# Patient Record
Sex: Female | Born: 1960
Health system: Southern US, Community
[De-identification: ages and names within clinical notes are randomized; demographics above are authoritative.]

## PROBLEM LIST (undated history)

## (undated) DIAGNOSIS — E785 Hyperlipidemia, unspecified: Secondary | ICD-10-CM

## (undated) DIAGNOSIS — I10 Essential (primary) hypertension: Secondary | ICD-10-CM

## (undated) DIAGNOSIS — G510 Bell's palsy: Secondary | ICD-10-CM

## (undated) DIAGNOSIS — E119 Type 2 diabetes mellitus without complications: Secondary | ICD-10-CM

## (undated) HISTORY — DX: Hyperlipidemia, unspecified: E78.5

## (undated) HISTORY — PX: CHOLECYSTECTOMY: SHX55

## (undated) HISTORY — DX: Bell's palsy: G51.0

---

## 2014-04-12 ENCOUNTER — Emergency Department (HOSPITAL_COMMUNITY)
Admission: EM | Admit: 2014-04-12 | Discharge: 2014-04-13 | Disposition: A | Discharge status: Home / Self Care | Payer: No Typology Code available for payment source | Attending: Emergency Medicine | Admitting: Emergency Medicine

## 2014-04-12 ENCOUNTER — Encounter (HOSPITAL_COMMUNITY): Payer: Self-pay | Admitting: Emergency Medicine

## 2014-04-12 ENCOUNTER — Emergency Department (HOSPITAL_COMMUNITY): Payer: No Typology Code available for payment source

## 2014-04-12 DIAGNOSIS — N644 Mastodynia: Secondary | ICD-10-CM | POA: Insufficient documentation

## 2014-04-12 DIAGNOSIS — M79609 Pain in unspecified limb: Secondary | ICD-10-CM | POA: Insufficient documentation

## 2014-04-12 DIAGNOSIS — M79602 Pain in left arm: Secondary | ICD-10-CM

## 2014-04-12 LAB — BASIC METABOLIC PANEL
Anion gap: 12 (ref 5–15)
BUN: 9 mg/dL (ref 6–23)
CO2: 25 mEq/L (ref 19–32)
Calcium: 9.4 mg/dL (ref 8.4–10.5)
Chloride: 100 mEq/L (ref 96–112)
Creatinine, Ser: 0.62 mg/dL (ref 0.50–1.10)
GFR calc Af Amer: >90 mL/min (ref >90)
Glucose, Bld: 110 mg/dL — ABNORMAL HIGH (ref 70–99)
POTASSIUM: 3.5 meq/L — AB (ref 3.7–5.3)
Sodium: 137 mEq/L (ref 137–147)

## 2014-04-12 LAB — D-DIMER, QUANTITATIVE (NOT AT ARMC): D-Dimer, Quant: 0.5 ug/mL-FEU — ABNORMAL HIGH (ref 0.00–0.48)

## 2014-04-12 LAB — CBC
HCT: 39.4 % (ref 36.0–46.0)
Hemoglobin: 13.4 g/dL (ref 12.0–15.0)
MCH: 30.2 pg (ref 26.0–34.0)
MCHC: 34 g/dL (ref 30.0–36.0)
MCV: 88.9 fL (ref 78.0–100.0)
Platelets: 186 10*3/uL (ref 150–400)
RBC: 4.43 MIL/uL (ref 3.87–5.11)
RDW: 13.1 % (ref 11.5–15.5)
WBC: 12.2 10*3/uL — ABNORMAL HIGH (ref 4.0–10.5)

## 2014-04-12 MED ORDER — HYDROCODONE-ACETAMINOPHEN 5-325 MG PO TABS: 1.0000 | ORAL_TABLET | ORAL | Status: DC | PRN

## 2014-04-12 MED ORDER — OXYCODONE-ACETAMINOPHEN 5-325 MG PO TABS
1.0000 | ORAL_TABLET | Freq: Once | ORAL | Status: AC
Start: 2014-04-12 — End: 2014-04-12
  Administered 2014-04-12: 1 via ORAL
  Filled 2014-04-12: qty 1

## 2014-04-12 NOTE — ED Provider Notes (Signed)
CSN: [id number]     Arrival date & time 04/12/14  1741 History   First MD Initiated Contact with Patient 04/12/14 1949     Chief Complaint  Patient presents with  . Breast Pain  . Arm Pain     (Consider location/radiation/quality/duration/timing/severity/associated sxs/prior Treatment) HPI Comments: 53 year old Hispanic female presents to the emergency department with her close friend was complaining of left arm and left breast pain x1 month, worsening over the past 2 days. No known injury or trauma. Pain is constant, throbbing, worse with left arm movement or when clothing touches the area. She has not tried any alleviating factors. She reports she felt feverish but is unsure if she had a temperature. Denies chest pain or shortness of breath. Denies history of breast cancer. No nipple discharge. No swelling or color change. Denies history of blood clots. Denies any activity out of her normal.  Patient is a 53 y.o. female presenting with arm pain. The history is provided by the patient and a friend.  Arm Pain    History reviewed. No pertinent past medical history. History reviewed. No pertinent past surgical history. No family history on file. History  Substance Use Topics  . Smoking status: Never Smoker   . Smokeless tobacco: Not on file  . Alcohol Use: No   OB History   Grav Para Term Preterm Abortions TAB SAB Ect Mult Living                 Review of Systems  Genitourinary:       + left breast pain.  Musculoskeletal:       + left arm pain.  All other systems reviewed and are negative.     Allergies  Review of patient's allergies indicates no known allergies.  Home Medications   Prior to Admission medications   Medication Sig Start Date End Date Taking? Authorizing Provider  HYDROcodone-acetaminophen (NORCO/VICODIN) 5-325 MG per tablet Take 1-2 tablets by mouth every 4 (four) hours as needed. 04/12/14   Trevor Mace, PA-C  naproxen (NAPROSYN) 250 MG tablet Take  500 mg by mouth 2 (two) times daily as needed (pain).   Yes Historical Provider, MD   BP 99/49  Pulse 73  Temp(Src) 97.7 F (36.5 C) (Oral)  Resp 14  SpO2 97% Physical Exam  Nursing note and vitals reviewed. Constitutional: She is oriented to person, place, and time. She appears well-developed and well-nourished. No distress.  HENT:  Head: Normocephalic and atraumatic.  Mouth/Throat: Oropharynx is clear and moist.  Eyes: Conjunctivae and EOM are normal.  Neck: Normal range of motion. Neck supple.  Cardiovascular: Normal rate, regular rhythm, normal heart sounds and intact distal pulses.   Pulmonary/Chest: Effort normal and breath sounds normal. No respiratory distress.  Musculoskeletal: Normal range of motion. She exhibits no edema.  Tender to palpation over entire left breast and pectoralis muscle without overlying erythema or abscess. No discharge. No masses palpated. Tender to palpation left upper arm. No size discrepancy of bilateral upper extremities. No erythema. No focal tenderness. Full range of motion of left arm. Pain noted with movement.  Lymphadenopathy:    She has no axillary adenopathy.       Right: No supraclavicular adenopathy present.       Left: No supraclavicular adenopathy present.  Neurological: She is alert and oriented to person, place, and time. No sensory deficit.  Skin: Skin is warm and dry.  Psychiatric: She has a normal mood and affect. Her behavior is normal.  ED Course  Procedures (including critical care time) Labs Review Labs Reviewed  CBC - Abnormal; Notable for the following:    WBC 12.2 (*)    All other components within normal limits  BASIC METABOLIC PANEL - Abnormal; Notable for the following:    Potassium 3.5 (*)    Glucose, Bld 110 (*)    All other components within normal limits  D-DIMER, QUANTITATIVE - Abnormal; Notable for the following:    D-Dimer, Quant 0.50 (*)    All other components within normal limits    Imaging  Review Dg Chest 2 View  04/12/2014   CLINICAL DATA:  Left-sided chest pain for 2 days. Shortness of breath and wheezing.  EXAM: CHEST  2 VIEW  COMPARISON:  None.  FINDINGS: There is peribronchial thickening consistent with bronchitis. No consolidative infiltrates or effusions. Heart size is normal. Vascularity is normal. No osseous abnormality.  IMPRESSION: Bronchitic changes.   Electronically Signed   By: Geanie CooleyJim  Maxwell M.D.   On: 04/12/2014 23:01     EKG Interpretation   Date/Time:  Tuesday April 12 2014 22:43:43 EDT Ventricular Rate:  68 PR Interval:  143 QRS Duration: 96 QT Interval:  444 QTC Calculation: 472 R Axis:   -10 Text Interpretation:  Sinus rhythm No previous ECGs available Confirmed by  RANCOUR  MD, STEPHEN [phone]([id number]) on 04/12/2014 10:49:59 PM      MDM   Final diagnoses:  Breast pain, left  Left arm pain   Patient presenting with reproducible left-sided breast and arm pain. She is nontoxic appearing and in no apparent distress. Afebrile, vital signs stable. No associated chest pain or shortness of breath. No signs of infection present. No abscess or masses. No drainage. No extremity size discrepancy. No adenopathy. Low suspicion for a DVT, however given the significance of left arm tenderness, upper extremity venous duplex ordered. Patient will return to the emergency department tomorrow for the study. D-dimer 0.5. Very low suspicion for PE given no associated chest pain or shortness of breath. EKG, chest x-ray without any acute findings. Patient reports symptoms have started to improve with Percocet. Patient is stable for discharge, followup back to the hospital tomorrow for duplex ultrasound, also followup with the breast clinic. Return precautions given. Patient states understanding of treatment care plan and is agreeable.  Case discussed with attending Dr. Manus Gunningancour who also evaluated patient and agrees with plan of care.   Trevor MaceRobyn M Albert, PA-C 04/12/14 2355

## 2014-04-12 NOTE — ED Notes (Signed)
Pt c/o of left breat pain and left arm pain x1 month. States that she can barely move arm. Denies injury.

## 2014-04-12 NOTE — ED Notes (Signed)
Pt reports having left breast pain that radiates to the left arm.  No swelling noted on left breast but the breast is tender to palpation.  Pt has a +2 radial pulse on left arm.

## 2014-04-12 NOTE — Discharge Instructions (Signed)
Take Vicodin for severe pain only. No driving or operating heavy machinery while taking vicodin. This medication may cause drowsiness. Return to [facility] emergency department tomorrow to the radiology department for upper extremity venous duplex to rule out DVT.  Tome Vicodin slo para Occupational hygienist. No se puede conducir o manejar maquinaria pesada mientras tomo vicodin . Este medicamento puede causar somnolencia . Regresar a Artist maana para el departamento de radiologa de dplex venoso de las extremidades superiores para descartar TVP.  Dolor msculoesqueltico (Musculoskeletal Pain) El dolor musculoesqueltico se siente en huesos y msculos. El dolor puede ocurrir en cualquier parte del cuerpo. El profesional que lo asiste podr tratarlo sin Geologist, engineering causa del dolor. Lo tratar Time [employer] de laboratorio (sangre y [place]), las radiografas y otros estudios sean normales. La causa de estos dolores puede ser un virus.  CAUSAS Generalmente no existe una causa definida para este trastorno. Tambin el Citigroup puede deberse a la [place]. En la actividad excesiva se incluye el hacer ejercicios fsicos muy intensos cuando no se est en buena forma. El dolor de huesos tambin puede deberse a cambios climticos. Los huesos son sensibles a los cambios en la presin atmosfrica. INSTRUCCIONES PARA EL CUIDADO DOMICILIARIO  Para proteger su privacidad, no se entregarn los [employer] pruebas por telfono. Asegrese de conseguirlos. Consulte el modo en que podr obtenerlos si no se lo han informado. Es su responsabilidad contar con los [employer] Corporation.  Utilice los medicamentos de venta libre o de prescripcin para Chief Technology Officer, Environmental health practitioner o la [place], segn se lo indique el profesional que lo asiste. Si le han administrado medicamentos, no conduzca, no opere maquinarias ni Diplomatic Services operational officer, y tampoco firme documentos legales durante  24 horas. No beba alcohol. No tome pldoras para dormir ni otros medicamentos que Museum/gallery curator.  Podr seguir con todas las actividades a menos que stas le ocasionen ms Merck & Co. Cuando el dolor disminuya, es importante que gradualmente reanude toda la rutina habitual. Retome las actividades comenzando lentamente. Aumente gradualmente la intensidad y la duracin de sus actividades o del ejercicio.  Durante los perodos de dolor intenso, el reposo en cama puede ser beneficioso. Recustese o sintese en la posicin que le sea ms cmoda.  Coloque hielo sobre la zona afectada.  Ponga hielo en Lucile Shutters.  Colquese una toalla entre la piel y la bolsa de hielo.  Aplique el hielo durante 10 a 20 minutos 3  4 veces por da.  Si el dolor empeora, o no desaparece puede ser Northeast Utilities repetir las pruebas o Education officer, environmental nuevos exmenes. El profesional que lo asiste podr requerir investigar ms profundamente para Veterinary surgeon causa posible. SOLICITE ATENCIN MDICA DE INMEDIATO SI:  Siente que el dolor empeora y no se alivia con los medicamentos.  Siente dolor en el pecho asociado a falta de aire, sudoracin, nuseas o vmitos.  El dolor se localiza en el abdomen.  Comienza a sentir nuevos sntomas que parecen ser diferentes o que lo preocupan. ASEGRESE DE QUE:   Comprende las instrucciones para el alta mdica.  Controlar su enfermedad.  Solicitar atencin mdica de inmediato segn las indicaciones. Document Released: 06/12/2005 Document Revised: 11/25/2011 [facility] Patient Information 2015 [place], [place]. This information is not intended to replace advice given to you by your health care provider. Make sure you discuss any questions you have with your health care provider. Trombosis venosa profunda (Deep Vein Thrombosis) La trombosis venosa profunda (TVP) es  la formacin de un cogulo de sangre en las venas profundas y ms grandes de la pierna, el brazo o la pelvis.  Esta trombosis es ms peligrosa que los cogulos que se forman en las venas ubicadas cerca de la superficie del cuerpo. La TVP puede causar complicaciones graves e, incluso, potencialmente mortales si el cogulo se desprende y viaja a travs de la Runner, broadcasting/film/video a los pulmones.  La TVP puede daar las vlvulas de las venas de las piernas, de manera que el flujo sanguneo, Teacher, English as a foreign language de ir hacia arriba, se acumula en la pierna. A este trastorno se lo llama sndrome postrombtico, y Patent examiner, hinchazn, decoloracin y lceras en la pierna. CAUSAS Generalmente, varios son los motivos que contribuyen a la formacin de cogulos sanguneos. Algunos de los factores que favorecen la formacin de cogulos son:  El flujo sanguneo lento.  El interior de la vena daado por algn motivo.  Tiene algn padecimiento que favorece la formacin de cogulos de [place]. FACTORES DE RIESGO Algunas personas tienen ms facilidad que otras para formar cogulos sanguneos. Los factores de riesgo son:   Rosalva Ferron.  Tener sobrepeso (ser obeso).  Permanecer sentado o acostado durante un tiempo prolongado. Aqu se incluyen un viaje de larga distancia, casos de parlisis o en recuperacin de una enfermedad o ciruga. Otros factores que [place] riesgo son:   La edad [place], especialmente despus de los 75aos de edad.  Tener antecedentes familiares de cogulos de sangre o si ya ha tenido un cogulo de [place].  Que le hayan practicado una ciruga mayor o de duracin prolongada. Esto es an ms frecuente en cirugas de cadera, rodilla o estmago (abdomen). La ciruga de cadera es especialmente riesgosa.  Tener colocado un tubo fino y Equities trader (catter) dentro de una vena durante un procedimiento mdico.  Fractura de cadera o de pierna.  Cncer o tratamiento para el cncer.  Embarazo y [place].  Los cambios hormonales durante el embarazo facilitan la formacin de cogulos.  El feto hace presin sobre  las venas de la pelvis.  Hay un riesgo de que se lesionen las venas durante el parto o la cesrea. El riesgo es mayor inmediatamente despus del [place].  Los medicamentos que contienen estrgenos. Estos incluyen las pldoras anticonceptivas y la terapia de reemplazo hormonal.  Otras afecciones circulatorias o cardacas.  SIGNOS Y SNTOMAS Cuando se forma un cogulo, puede obstruir en forma parcial o total el flujo sanguneo en la vena afectada. Los sntomas de TVP pueden ser:  Hinchazn de la pierna o del brazo, especialmente notorio de un solo lado.  Enrojecimiento y calor de la pierna o del brazo, Haematologist de un solo lado.  Dolor en un brazo o en una pierna. Si el cogulo est ubicado en la pierna, los sntomas pueden ser ms notorios y Theme park manager al pararse o Advertising account planner. Los sntomas de TVP, cuando ha migrado a los pulmones, (embolia de [place], [place]) y Scientist, research (physical sciences), comienzan sbitamente y pueden ser:  Harrel Lemon de aire.  Tos.  Tos con sangre o mucosidad sanguinolenta.  Dolor en el pecho. Dolor que empeora al hacer inspiraciones profundas.  Latidos cardacos rpidos. Teresita [place] persona con estos sntomas debe buscar rpidamente tratamiento mdico de Associate Professor. No espere hasta que los sntomas desaparezcan. Comunquese con el servicio de emergencias de su localidad (911 en los [place]) si usted tiene estos sntomas. No conduzca por sus propios medios [employer]Max [employer]. DIAGNSTICO Si se sospecha una TVP, su mdico le har una historia clnica [place] y  un examen fsico. Podrn solicitarle otros estudios, por ejemplo:  Anlisis de [place], incluyendo estudios de [place].  Ecografa para detectar cogulos en las piernas o los pulmones.  Estudios radiolgicos con inyeccin de contraste intravenoso para ver el flujo sanguneo (venografa).  Estudios de los pulmones, si tiene sntomas respiratorios. PREVENCIN  Ejercite las piernas con regularidad. Camine media Best [employer].  Mantenga un peso apropiado para su altura.  Evite permanecer sentado o parado sin moverse durante perodos prolongados.  Las mujeres, especialmente las que tienen ms de 35aos, deben C.H. [employer] Worldwide y beneficios de tomar medicamentos que contengan estrgeno, incluidas las pldoras anticonceptivas.  No fume, especialmente si toma estrgenos.  Los viajes de larga distancia pueden aumentar el riesgo de TVP. Ejercite las piernas caminando o moviendo los msculos una vez por hora.  Muchos de los factores de riesgo anteriormente mencionados se refieren a situaciones que ocurren con la hospitalizacin, ya sea por enfermedad, lesin o ciruga programada. La prevencin puede ser con o sin medicamentos.  El mdico lo evaluar para Production assistant, radio necesidad de prevenir la tromboembolia venosa cuando ingrese al hospital. Si debe someterse a [place], su cirujano lo evaluar el mismo da o el da despus de la [place]. TRATAMIENTO Si se diagnostica una TVP, debe ser tratada. Tambin en algunas circunstancias, puede prevenirse. Si ya ha tenido una TVP, aumenta el riesgo de volver a Teacher, music futuro . El tratamiento ms frecuente para la TVP se realiza con anticoagulantes, medicamentos que reducen la tendencia de la sangre a coagularse. Los anticoagulantes pueden impedir la formacin de nuevos cogulos sanguneos o el aumento del tamao de los cogulos existentes. No pueden disolver los cogulos ya formados. El organismo lo hace por s mismo con Museum/gallery conservator. Los anticoagulantes pueden administrarse por va oral, intravenosa (IV) o inyectable. Su mdico determinar la mejor forma para usted. Estos son otros medicamentos o tratamiento que pueden utilizarse:  La heparina o los medicamentos relacionados (heparina de bajo peso molecular) suelen ser Financial risk analyst tratamiento para un cogulo sanguneo. Actan rpidamente. Sin embargo, no pueden tomarse por va oral y deben administrarse por va inyectable  o intravenosa.  La heparina disminuye la cantidad de un componente de la sangre cuya funcin es impedir las hemorragias y forma cogulos sanguneos (plaquetas). Ser controlado mediante anlisis de sangre para asegurarse de que esto no [place].  La warfarina es un anticoagulante que puede ingerirse. Demora unos Wells [employer] a Scientist, water quality, por lo tanto se [place] en combinacin con heparina o medicamentos relacionados. Una vez que la warfarina comienza a actuar, la heparina se suspende.  Los inhibidores del factorX activado, como rivaroxaban y apixaban, tambin reducen la coagulacin sangunea. Estos medicamentos se toman por va oral y a menudo pueden administrarse sin heparina ni medicamentos relacionados.  Con menor frecuencia, se utilizan medicamentos que disuelven cogulos (trombolticos) para Restaurant manager, fast food una TVP. Como implican un mayor riesgo de [place], se utilizan principalmente en los casos graves, cuando la vida o la integridad fsica se ven [place].  En contadas ocasiones, un cogulo de sangre en la pierna debe eliminarse quirrgicamente.  Si usted no puede Solicitor, su mdico podra indicar la colocacin de un filtro en la vena principal del abdomen. Los filtros impiden que los cogulos lleguen a los pulmones. INSTRUCCIONES PARA EL CUIDADO EN EL HOGAR  Tome todos los Monsanto Company se lo haya indicado el mdico.  Aprenda tanto como pueda sobre la TVP.  Utilice un brazalete o lleve consigo una tarjeta  de alerta mdico.  Consulte con su mdico cundo podr volver a sus actividades normales. Es importante mantenerse activo para prevenir los cogulos sanguneos. Si est tomando un medicamento anticoagulante, evite los deportes de contacto.  Es Barrister's clerk. Esto es especialmente importante durante viajes, o al estar sentado o de pie durante largos perodos. Camine o contraiga y relaje frecuentemente los msculos de las piernas para ejercitarlas.  Camine con frecuencia.  Es posible que deba usar medias de compresin. Son medias elsticas apretadas que aplican presin a las piernas. Esta presin ayuda a Programmer, systems en las piernas se coagule. Cmo tomar la warfarina La warfarina es un medicamento que se toma diariamente por va oral. Su mdico le indicar la duracin del tratamiento (generalmente de 3 a y a Occupational psychologist toda la vida). Si toma warfarina:  Aprenda cmo tomar warfarina y cules son los alimentos que pueden influir en el efecto que esta tiene en el organismo.  Las dosis altas y bajas de warfarina son peligrosas. El exceso de warfarina aumenta el riesgo de [place]. Dosis demasiado bajas de warfarina aumentan el riesgo de formacin de cogulos. La warfarina y los anlisis de sangre peridicos Durante el tratamiento con Arts administrator, es necesario que se realicen peridicamente anlisis de sangre que miden el tiempo de Biomedical scientist. Estos anlisis de sangre suelen incluir tanto el tiempo de protrombina (PT) como el ndice internacional normalizado (INR). Los resultados del PT y del INR permiten al mdico ajustar la dosis de warfarina. Es muy importante que le hagan anlisis del PT y el INR con la frecuencia que el mdico lo indique.  La warfarina y la dieta Evite cambios importantes en su dieta, o hable con su mdico antes de cambiarla. Programe una cita con un nutricionista matriculado para que responda sus preguntas. Muchos de los alimentos, especialmente los que contienen vitamina K pueden interferir con la warfarina y Audiological scientist los [place] de PT y del INR. Debe ingerir una cantidad constante de alimentos con alto contenido de vitaminaK, [employer] se incluyen:   espinaca, col rizada, brcoli, repollo, acelga y nabos verdes, repollitos de [place], guisantes, coliflor, algas y perejil.  Hgado de vaca y de cerdo.  T verde.  Aceite de soja. La warfarina y otros medicamentos Muchos medicamentos interfieren con la  warfarina y [employer]Ex del PT y del INR. Usted debe:  Informar al [employer]rah[employer] Entertainment, las vitaminas y los suplementos que toma, entre ellos, aspirina y otros antiinflamatorios de Sales promotion account executive. Tenga especial cuidado con la aspirina y los medicamentos antiinflamatorios. Consulte a su mdico antes de tomarlos.  No tome ni suspenda ningn medicamento tanto recetado como de [employer], excepto si se lo indica el mdico o Administrator, sports. Efectos secundarios de la warfarina La warfarina puede causar efectos secundarios, por ejemplo, hematomas y dificultad para detener el sangrado. Pregntele al mdico o al farmacutico sobre otros efectos secundarios de la warfarina. Usted deber:  Ejercer presin sobre las heridas cortantes durante ms tiempo que lo habitual.  Informar al dentista y a otros mdicos que est tomando warfarina, antes de someterse a cualquier procedimiento que pueda causar sangrado. La warfarina con alcohol y tabaco   Beber alcohol con frecuencia puede potenciar el efecto de la warfarina y causar sangrado excesivo. Es preferible no tomar bebidas alcohlicas o consumir solo cantidades muy pequeas cuando se est en tratamiento con warfarina. Hgale saber a su mdico si modifica su consumo de alcohol.  No consuma ningn producto que  contenga tabaco, como cigarrillos, tabaco de Theatre managermascar o Administrator, Civil Servicecigarrillos electrnicos. Si fuma, abandone el hbito. Pdale ayuda al mdico, si es necesario. Medicamentos alternativos de la warfarina: Inhibidores del factorX activado  Estos anticoagulantes se toman por va oral, generalmente durante varias semanas o ms tiempo. Es importante que tome el medicamento a la misma hora todos [place] das.  No es necesario realizar anlisis de sangre peridicos cuando se toman estos medicamentos.  Estos tienen menos interacciones con los alimentos y los medicamentos que la warfarina.  Los efectos secundarios de esta clase de medicamentos son  similares a los que se presentan con la warfarina, incluidos los hematomas y el sangrado excesivos. Pregnteles al mdico o al farmacutico sobre otros posibles efectos secundarios. SOLICITE ATENCIN MDICA SI:  Nota que el ritmo de sus latidos cardacos est acelerado.  Se siente ms dbil o ms cansado que de costumbre.  Siente que va a desmayarse.  Observa un aumento de los hematomas.  Siente que los sntomas no mejoran en el tiempo esperado.  Cree que tiene efectos secundarios por el medicamento. SOLICITE ATENCIN MDICA DE INMEDIATO SI:  Siente dolor en el pecho.  Tiene dificultad para respirar.  Aumenta la hinchazn o el dolor en una pierna.  Tose y escupe sangre.  Nota sangre en el vmito, en las heces o en la orina. ASEGRESE DE QUE:  Comprende estas instrucciones.  Controlar su afeccin.  Recibir ayuda de inmediato si no mejora o si empeora. Document Released: 12/10/2007 Document Revised: 01/17/2014 [facility] Patient Information 2015 [place], [place]. This information is not intended to replace advice given to you by your health care provider. Make sure you discuss any questions you have with your health care provider.

## 2014-04-13 ENCOUNTER — Encounter (HOSPITAL_COMMUNITY): Payer: Self-pay | Admitting: Emergency Medicine

## 2014-04-13 ENCOUNTER — Emergency Department (HOSPITAL_COMMUNITY)
Admission: EM | Admit: 2014-04-13 | Discharge: 2014-04-13 | Disposition: A | Discharge status: Home / Self Care | Payer: No Typology Code available for payment source | Attending: Emergency Medicine | Admitting: Emergency Medicine

## 2014-04-13 ENCOUNTER — Ambulatory Visit (HOSPITAL_COMMUNITY): Payer: No Typology Code available for payment source

## 2014-04-13 DIAGNOSIS — N390 Urinary tract infection, site not specified: Secondary | ICD-10-CM | POA: Insufficient documentation

## 2014-04-13 DIAGNOSIS — Z792 Long term (current) use of antibiotics: Secondary | ICD-10-CM | POA: Insufficient documentation

## 2014-04-13 DIAGNOSIS — R079 Chest pain, unspecified: Secondary | ICD-10-CM | POA: Insufficient documentation

## 2014-04-13 DIAGNOSIS — M79609 Pain in unspecified limb: Secondary | ICD-10-CM

## 2014-04-13 DIAGNOSIS — J3489 Other specified disorders of nose and nasal sinuses: Secondary | ICD-10-CM | POA: Insufficient documentation

## 2014-04-13 DIAGNOSIS — N644 Mastodynia: Secondary | ICD-10-CM | POA: Insufficient documentation

## 2014-04-13 LAB — URINALYSIS, ROUTINE W REFLEX MICROSCOPIC
Bilirubin Urine: NEGATIVE
Glucose, UA: NEGATIVE mg/dL
KETONES UR: NEGATIVE mg/dL
NITRITE: NEGATIVE
PH: 6 (ref 5.0–8.0)
PROTEIN: NEGATIVE mg/dL
Specific Gravity, Urine: 1.02 (ref 1.005–1.030)
Urobilinogen, UA: 1 mg/dL (ref 0.0–1.0)

## 2014-04-13 LAB — URINE MICROSCOPIC-ADD ON

## 2014-04-13 MED ORDER — SULFAMETHOXAZOLE-TMP DS 800-160 MG PO TABS: 1.0000 | ORAL_TABLET | Freq: Two times a day (BID) | ORAL | Status: DC

## 2014-04-13 NOTE — ED Notes (Signed)
Pt to ultrasound via wheelchair.

## 2014-04-13 NOTE — ED Notes (Signed)
Called lab to verify they would add on urine culture to urine in lab

## 2014-04-13 NOTE — ED Notes (Addendum)
Patient states she has been having L breast pain and L arm pain x 1 month with worsening on Monday.   [facility].

## 2014-04-13 NOTE — Progress Notes (Signed)
Left upper extremity venous duplex:  No evidence of DVT or superficial thrombosis.    

## 2014-04-13 NOTE — ED Provider Notes (Signed)
CSN: [id number]     Arrival date & time 04/13/14  1152 History   First MD Initiated Contact with Patient 04/13/14 1431     Chief Complaint  Patient presents with  . Chest Pain  . Arm Pain     (Consider location/radiation/quality/duration/timing/severity/associated sxs/prior Treatment) HPI  53 yo female with no PMH here with 1 month of  breast/chest wall pain that radiates to the left axilla. It increased in severity over last 3 days, currently pain is 6/10 squeezing in nature, constant, worsens with left arm movement. Was seen at the ED yesterday. CXR was mostly normal. Was instructed to come here for Left Upper ext DVT r/o. Has family hx of breast cancer - her mother and cousin has it. Patient is post-menopausal. No discharge noted from the nipple. No lumps or bumps on either breast. No palpitations or substernal chest pressure. No other symptoms such as leg swelling, headache, vaginal discharge, diarrhea, n/v. Has some problem breathing due to nasal congestion, otherwise breathing without problem.  History reviewed. No pertinent past medical history. History reviewed. No pertinent past surgical history. No family history on file. History  Substance Use Topics  . Smoking status: Never Smoker   . Smokeless tobacco: Not on file  . Alcohol Use: No   OB History   Grav Para Term Preterm Abortions TAB SAB Ect Mult Living                 Review of Systems  Constitutional: Negative for fever, chills, appetite change and fatigue.  HENT: Positive for congestion. Negative for ear discharge, ear pain, hearing loss, nosebleeds, rhinorrhea, sinus pressure, sore throat and tinnitus.   Eyes: Negative.   Respiratory: Positive for chest tightness. Negative for cough, choking, shortness of breath, wheezing and stridor.   Cardiovascular: Positive for chest pain. Negative for palpitations and leg swelling.  Gastrointestinal: Negative.   Genitourinary: Positive for dysuria. Negative for urgency,  hematuria, decreased urine volume, vaginal bleeding, vaginal discharge, difficulty urinating, genital sores, vaginal pain and pelvic pain.  Allergic/Immunologic: Negative.   Neurological: Negative for dizziness, tremors, seizures, syncope, facial asymmetry, speech difficulty, weakness, light-headedness, numbness and headaches.  Hematological: Negative.   Psychiatric/Behavioral: Negative.       Allergies  Review of patient's allergies indicates no known allergies.  Home Medications   Prior to Admission medications   Medication Sig Start Date End Date Taking? Authorizing Provider  HYDROcodone-acetaminophen (NORCO/VICODIN) 5-325 MG per tablet Take 1 tablet by mouth every 4 (four) hours as needed for moderate pain.   Yes Historical Provider, MD  sulfamethoxazole-trimethoprim (BACTRIM DS) 800-160 MG per tablet Take 1 tablet by mouth 2 (two) times daily. 04/13/14   Kyrel Leighton, MD   BP 130/77  Pulse 62  Temp(Src) 98.3 F (36.8 C) (Oral)  Resp 17  Ht 5' (1.524 m)  Wt 200 lb (90.719 kg)  BMI 39.06 kg/m2  SpO2 99% Physical Exam  Constitutional: She is oriented to person, place, and time. She appears well-developed and well-nourished. No distress.  HENT:  Head: Normocephalic and atraumatic.  Right Ear: External ear normal.  Left Ear: External ear normal.  Nose: Nose normal.  Mouth/Throat: Oropharynx is clear and moist.  Eyes: Conjunctivae and EOM are normal. Pupils are equal, round, and reactive to light. Right eye exhibits no discharge. Left eye exhibits no discharge. No scleral icterus.  Neck: Normal range of motion. Neck supple. No JVD present. No thyromegaly present.  Cardiovascular: Normal rate, regular rhythm, S1 normal, S2 normal, normal heart  sounds and intact distal pulses.  Exam reveals no gallop and no friction rub.   No murmur heard. Pulmonary/Chest: Effort normal and breath sounds normal. No respiratory distress. She has no wheezes. She has no rales. Chest wall is not dull  to percussion. She exhibits tenderness. She exhibits no mass, no bony tenderness, no laceration, no crepitus, no edema, no deformity, no swelling and no retraction. Right breast exhibits tenderness. Right breast exhibits no inverted nipple, no mass, no nipple discharge and no skin change. Left breast exhibits tenderness. Left breast exhibits no inverted nipple, no mass, no nipple discharge and no skin change.  Has tenderness to palpation over her sternum and on her left chest wall. Also has tenderness to palpation over her breast. Breast/chest tenderness upon movement of the left arm noted. No nipple discharge, no masses on breast exam. Skin appears normal over the breasts.   Mild TTP on right breast.  No tenderness or swelling of either arms.   Abdominal: Soft. Bowel sounds are normal. She exhibits no distension and no mass. There is no tenderness. There is no rebound and no guarding.  Musculoskeletal: She exhibits no edema and no tenderness.       Right knee: She exhibits decreased range of motion. She exhibits no swelling.  Right knee ROM limited due to chronic pain. Normal strength.   Lymphadenopathy:    She has no cervical adenopathy.  Neurological: She is alert and oriented to person, place, and time. She has normal strength and normal reflexes. No cranial nerve deficit or sensory deficit.  Skin: No rash noted. She is not diaphoretic. No erythema. No pallor.  Psychiatric: She has a normal mood and affect. Her behavior is normal.    ED Course  Procedures (including critical care time) Labs Review Labs Reviewed  URINALYSIS, ROUTINE W REFLEX MICROSCOPIC - Abnormal; Notable for the following:    APPearance CLOUDY (*)    Hgb urine dipstick SMALL (*)    Leukocytes, UA LARGE (*)    All other components within normal limits  URINE MICROSCOPIC-ADD ON - Abnormal; Notable for the following:    Squamous Epithelial / LPF MANY (*)    Bacteria, UA MANY (*)    All other components within normal  limits  URINE CULTURE    Imaging Review Dg Chest 2 View  04/12/2014   CLINICAL DATA:  Left-sided chest pain for 2 days. Shortness of breath and wheezing.  EXAM: CHEST  2 VIEW  COMPARISON:  None.  FINDINGS: There is peribronchial thickening consistent with bronchitis. No consolidative infiltrates or effusions. Heart size is normal. Vascularity is normal. No osseous abnormality.  IMPRESSION: Bronchitic changes.   Electronically Signed   By: Geanie CooleyJim  Maxwell M.D.   On: 04/12/2014 23:01     EKG Interpretation None      Patient instructed to come here to get left arm doppler u/s to r/o dvt. Will do that.  Also check UA b/c complaining of cloudy urine.  No dvt seen on u/s.  Will order outpatient mammogram to evaluate breast pain.  UA shows UTI. Will start treatment with 5 days bactrim. MDM   Final diagnoses:  Breast pain, left  UTI (lower urinary tract infection)   Patient here with left sided breast pain, chest wall tenderness, and left arm pain. Came in today to get doppler of left arm. Doppler is normal. Ordered outpatient mammogram to evaluate the breast pain. Gave her 5 days of bactrim for UTI. Instructed her to follow up with a  primary care doctor with the results.     Hyacinth Meeker, MD 04/13/14 [phone]

## 2014-04-13 NOTE — Discharge Instructions (Signed)
Please make appointment at the [facility] to have a mammogram there as soon as you can.   Your left arm ultrasound was normal.  Follow up with outpatient primary care doctor with the results.  You also have an urinary tract infection. Take the antibiotic bactrim for 5 days.

## 2014-04-13 NOTE — ED Provider Notes (Signed)
Medical screening examination/treatment/procedure(s) were conducted as a shared visit with non-physician practitioner(s) and myself.  I personally evaluated the patient during the encounter.  1 month of L upper arm, chest wall, and breast pain without trauma. TTP L chest wall and L upper arm.  Intact radial pulse and cardinal hand movements.  No asymmetry to arms or breasts.  L breast diffusely tender without overlying skin change. No palpable masses. No nipple discharge. Pain to chest wall and breast with ROM of L arm.   Atypical for ACS and reproducible pain.  EKG nsr. No appreciable breast masses but will need outpatient mammogram.   EKG Interpretation   Date/Time:  Tuesday April 12 2014 22:43:43 EDT Ventricular Rate:  68 PR Interval:  143 QRS Duration: 96 QT Interval:  444 QTC Calculation: 472 R Axis:   -10 Text Interpretation:  Sinus rhythm No previous ECGs available Confirmed by  Manus GunningANCOUR  MD, Halli Equihua [phone]([id number]) on 04/12/2014 10:49:59 PM       Glynn OctaveStephen Karam Dunson, MD 04/13/14 [phone]

## 2014-04-14 LAB — URINE CULTURE: Colony Count: >=100000

## 2014-04-14 NOTE — ED Provider Notes (Signed)
I saw and evaluated the patient, reviewed the resident's note and I agree with the findings and plan. Patient is a 53 year old hispanic female who presents for evaluation of left arm and breast pain.  She was seen for the same yesterday and told to return for an ultrasound of the left arm.  She denies any shortness of breath or pleuritic chest pain.  Her left breast is tender with movement and palpation.  On exam, vitals are stable and the patient is afebrile.  Head is at, [place].  Neck is supple.  Heart is rrr and lungs are clear.  Abd is non-tender with normal bowel sounds.  Extremities are without edema.  The left breast is ttp over the left upper.  Workup reveals no dvt.  She has a history of breast cancer in the family and is concerned about this possibility.  Will set up an outpatient mammogram as she has never had one.  Will treat with bactrim for uti, prn return.  Doubt pe or cardiac etiology.  Pain seems very musculoskeletal in nature.      Geoffery Lyonsouglas Gesselle Fitzsimons, MD 04/14/14 [phone]

## 2014-04-21 ENCOUNTER — Other Ambulatory Visit (HOSPITAL_COMMUNITY): Payer: Self-pay | Admitting: Internal Medicine

## 2014-04-21 DIAGNOSIS — N644 Mastodynia: Secondary | ICD-10-CM

## 2014-04-26 ENCOUNTER — Other Ambulatory Visit (HOSPITAL_COMMUNITY): Payer: Self-pay | Admitting: Internal Medicine

## 2014-04-26 DIAGNOSIS — N644 Mastodynia: Secondary | ICD-10-CM

## 2014-04-29 ENCOUNTER — Ambulatory Visit
Admission: RE | Admit: 2014-04-29 | Discharge: 2014-04-29 | Disposition: A | Discharge status: Home / Self Care | Payer: No Typology Code available for payment source | Source: Ambulatory Visit | Attending: Emergency Medicine | Admitting: Emergency Medicine

## 2014-04-29 DIAGNOSIS — N644 Mastodynia: Secondary | ICD-10-CM

## 2014-05-03 ENCOUNTER — Other Ambulatory Visit (HOSPITAL_COMMUNITY): Payer: Self-pay | Admitting: Internal Medicine

## 2014-05-03 DIAGNOSIS — N644 Mastodynia: Secondary | ICD-10-CM

## 2014-05-05 ENCOUNTER — Other Ambulatory Visit: Payer: Self-pay | Admitting: Internal Medicine

## 2014-05-05 ENCOUNTER — Other Ambulatory Visit: Payer: Self-pay

## 2014-05-05 DIAGNOSIS — N644 Mastodynia: Secondary | ICD-10-CM

## 2014-05-24 ENCOUNTER — Emergency Department (HOSPITAL_COMMUNITY)
Admission: EM | Admit: 2014-05-24 | Discharge: 2014-05-24 | Disposition: A | Discharge status: Home / Self Care | Payer: No Typology Code available for payment source | Attending: Emergency Medicine | Admitting: Emergency Medicine

## 2014-05-24 ENCOUNTER — Encounter (HOSPITAL_COMMUNITY): Payer: Self-pay | Admitting: Emergency Medicine

## 2014-05-24 DIAGNOSIS — Z792 Long term (current) use of antibiotics: Secondary | ICD-10-CM | POA: Insufficient documentation

## 2014-05-24 DIAGNOSIS — L0291 Cutaneous abscess, unspecified: Secondary | ICD-10-CM

## 2014-05-24 DIAGNOSIS — N61 Mastitis without abscess: Secondary | ICD-10-CM | POA: Insufficient documentation

## 2014-05-24 MED ORDER — HYDROCODONE-ACETAMINOPHEN 5-325 MG PO TABS: 2.0000 | ORAL_TABLET | ORAL | Status: DC | PRN

## 2014-05-24 MED ORDER — LIDOCAINE HCL 1 % IJ SOLN
20.0000 mL | Freq: Once | INTRAMUSCULAR | Status: AC
  Administered 2014-05-24: 20 mL via INTRADERMAL
  Filled 2014-05-24: qty 20

## 2014-05-24 MED ORDER — SULFAMETHOXAZOLE-TRIMETHOPRIM 800-160 MG PO TABS: 1.0000 | ORAL_TABLET | Freq: Two times a day (BID) | ORAL | Status: DC

## 2014-05-24 NOTE — Discharge Instructions (Signed)
Regreso a la sala de emergencias si presenta fiebre o la zona se vuelve ms roja o ms hinchada.  Ir a [facility] o regresa a la sala de emergencia en 2 das para una ? Servicios .  Absceso (Abscess)  Un absceso es una zona infectada que contiene pus y desechos.Puede aparecer en cualquier parte del cuerpo. Tambin se lo conoce como fornculo o divieso. CAUSAS  Ocurre cuando los tejidos se infectan. Tambin puede formarse por obstruccin de las glndulas sebceas o las glndulas sudorparas, infeccin de los folculos pilosos o por una lesin pequea en la piel. A medida que el organismo lucha contra la infeccin, se acumula pus en la zona y hace presin debajo de la piel. Esta presin causa dolor. Las personas con un sistema inmunolgico debilitado tienen dificultad para Industrial/product designer las infecciones y pueden formar abscesos con ms frecuencia.  SNTOMAS  Generalmente un absceso se forma sobre la piel y se vuelve una masa dolorosa, roja, caliente y sensible. Si se forma debajo de la piel, podr sentir como una zona blanda, que se [place], debajo de la piel. Algunos abscesos se abren (ruptura) por s mismos, pero la mayora seguir empeorando si no se lo trata. La infeccin puede diseminarse hacia otros sitios del cuerpo y finalmente al torrente sanguneo y hace que el enfermo se sienta mal.  DIAGNSTICO  El mdico le har una historia clnica y un examen fsico. Podrn tomarle Lauris Poag de lquido del absceso y Public librarian para Clinical research associate la causa de la infeccin. .  TRATAMIENTO  El mdico le indicar antibiticos para combatir la infeccin. Sin embargo, el uso de antibiticos solamente no curar el absceso. El mdico tendr que hacer un pequeo corte (incisin) en el absceso para drenar el pus. En algunos casos se introduce una gasa en el absceso para reducir Chief Technology Officer y que siga drenando la zona.  INSTRUCCIONES PARA EL CUIDADO EN EL HOGAR   Solo tome medicamentos de venta libre o  recetados para Chief Technology Officer, Dentist o fiebre, segn las indicaciones del mdico.  Si le han recetado antibiticos, tmelos segn las indicaciones. Tmelos todos, aunque se sienta mejor.  Si le aplicaron una gasa, siga las indicaciones del mdico para [place].  Para evitar la propagacin de la infeccin:  Mantenga el absceso cubierto con el vendaje.  Lvese bien las manos.  No comparta artculos de cuidado personal, toallas o jacuzzis con los dems.  Evite el contacto con la piel de Economist.  Mantenga la piel y la ropa limpia alrededor del absceso.  Cumpla con todas las visitas de control, segn le indique su mdico. SOLICITE ATENCIN MDICA SI:   Aumenta el dolor, la hinchazn, el enrojecimiento, drena lquido o sangra.  Siente dolores musculares, escalofros, o una sensacin general de Dentist.  Tiene fiebre. ASEGRESE DE QUE:   Comprende estas instrucciones.  Controlar su enfermedad.  Solicitar ayuda de inmediato si no mejora o si empeora. Document Released: 09/02/2005 Document Revised: 03/03/2012 [facility] Patient Information 2015 [place], [place]. This information is not intended to replace advice given to you by your health care provider. Make sure you discuss any questions you have with your health care provider. Abscess An abscess is an infected area that contains a collection of pus and debris.It can occur in almost any part of the body. An abscess is also known as a furuncle or boil. CAUSES  An abscess occurs when tissue gets infected. This can occur from blockage of oil or sweat glands, infection of  hair follicles, or a minor injury to the skin. As the body tries to fight the infection, pus collects in the area and creates pressure under the skin. This pressure causes pain. People with weakened immune systems have difficulty fighting infections and get certain abscesses more often.  SYMPTOMS Usually an abscess develops on the skin and becomes a painful mass  that is red, warm, and tender. If the abscess forms under the skin, you may feel a moveable soft area under the skin. Some abscesses break open (rupture) on their own, but most will continue to get worse without care. The infection can spread deeper into the body and eventually into the bloodstream, causing you to feel ill.  DIAGNOSIS  Your caregiver will take your medical history and perform a physical exam. A sample of fluid may also be taken from the abscess to determine what is causing your infection. TREATMENT  Your caregiver may prescribe antibiotic medicines to fight the infection. However, taking antibiotics alone usually does not cure an abscess. Your caregiver may need to make a small cut (incision) in the abscess to drain the pus. In some cases, gauze is packed into the abscess to reduce pain and to continue draining the area. HOME CARE INSTRUCTIONS   Only take over-the-counter or prescription medicines for pain, discomfort, or fever as directed by your caregiver.  If you were prescribed antibiotics, take them as directed. Finish them even if you start to feel better.  If gauze is used, follow your caregiver's directions for changing the gauze.  To avoid spreading the infection:  Keep your draining abscess covered with a bandage.  Wash your hands well.  Do not share personal care items, towels, or whirlpools with others.  Avoid skin contact with others.  Keep your skin and clothes clean around the abscess.  Keep all follow-up appointments as directed by your caregiver. SEEK MEDICAL CARE IF:   You have increased pain, swelling, redness, fluid drainage, or bleeding.  You have muscle aches, chills, or a general ill feeling.  You have a fever. MAKE SURE YOU:   Understand these instructions.  Will watch your condition.  Will get help right away if you are not doing well or get worse. Document Released: 06/12/2005 Document Revised: 03/03/2012 Document Reviewed:  11/15/2011 [facility] Patient Information 2015 [place], [place]. This information is not intended to replace advice given to you by your health care provider. Make sure you discuss any questions you have with your health care provider.

## 2014-05-24 NOTE — ED Provider Notes (Signed)
CSN: [id number]     Arrival date & time 05/24/14  1827 History   First MD Initiated Contact with Patient 05/24/14 2147     Chief Complaint  Patient presents with  . Abscess     (Consider location/radiation/quality/duration/timing/severity/associated sxs/prior Treatment) HPI Comments: Patient presents to the ER for evaluation of pain, swelling, redness and drainage from under her left breast. Patient reports that she has been seen several times for similar symptoms. Patient reports being treated with antibiotics in the past and has followed up at the breast Center. She reports imaging has not shown any significant abnormalities. Patient reports the last time she was on antibiotics was approximately a month ago. Symptoms have been present for the last 2 days.  Patient is a 53 y.o. female presenting with abscess.  Abscess   History reviewed. No pertinent past medical history. History reviewed. No pertinent past surgical history. No family history on file. History  Substance Use Topics  . Smoking status: Never Smoker   . Smokeless tobacco: Not on file  . Alcohol Use: No   OB History   Grav Para Term Preterm Abortions TAB SAB Ect Mult Living                 Review of Systems  Skin: Positive for wound.  All other systems reviewed and are negative.     Allergies  Review of patient's allergies indicates no known allergies.  Home Medications   Prior to Admission medications   Medication Sig Start Date End Date Taking? Authorizing Provider  HYDROcodone-acetaminophen (NORCO/VICODIN) 5-325 MG per tablet Take 1 tablet by mouth every 4 (four) hours as needed for moderate pain.   Yes Historical Provider, MD  sulfamethoxazole-trimethoprim (BACTRIM DS) 800-160 MG per tablet Take 1 tablet by mouth 2 (two) times daily. 04/13/14  Yes Tasrif Ahmed, MD   BP 131/68  Pulse 87  Temp(Src) 99.4 F (37.4 C) (Oral)  Resp 16  SpO2 98% Physical Exam  Constitutional: She is oriented to person,  place, and time. She appears well-developed and well-nourished. No distress.  HENT:  Head: Normocephalic and atraumatic.  Right Ear: Hearing normal.  Left Ear: Hearing normal.  Nose: Nose normal.  Mouth/Throat: Oropharynx is clear and moist and mucous membranes are normal.  Eyes: Conjunctivae and EOM are normal. Pupils are equal, round, and reactive to light.  Neck: Normal range of motion. Neck supple.  Cardiovascular: Regular rhythm, S1 normal and S2 normal.  Exam reveals no gallop and no friction rub.   No murmur heard. Pulmonary/Chest: Effort normal and breath sounds normal. No respiratory distress. She exhibits no tenderness.  Abdominal: Soft. Normal appearance and bowel sounds are normal. There is no hepatosplenomegaly. There is no tenderness. There is no rebound, no guarding, no tenderness at McBurney's point and negative Murphy's sign. No hernia.  Musculoskeletal: Normal range of motion.  Neurological: She is alert and oriented to person, place, and time. She has normal strength. No cranial nerve deficit or sensory deficit. Coordination normal. GCS eye subscore is 4. GCS verbal subscore is 5. GCS motor subscore is 6.  Skin: Skin is warm, dry and intact. No rash noted. No cyanosis.     Psychiatric: She has a normal mood and affect. Her speech is normal and behavior is normal. Thought content normal.    ED Course  Procedures (including critical care time)  INCISION AND DRAINAGE Performed by: Gilda Crease. Consent: Verbal consent obtained. Risks and benefits: risks, benefits and alternatives were discussed Type: abscess  Body area: left breast  Anesthesia: local infiltration  Incision was made with a scalpel.  Local anesthetic: lidocaine 1%   Anesthetic total: 3 ml  Complexity: complex Blunt dissection to break up loculations  Drainage: purulent  Drainage amount: minimal  Packing material: 1/4 in iodoform gauze  Patient tolerance: Patient tolerated the  procedure well with no immediate complications.    Labs Review Labs Reviewed - No data to display  Imaging Review No results found.   EKG Interpretation None      MDM   Final diagnoses:  None    Presents to the ER for evaluation of recurrent breast abscess. Patient does have erythema with a pointing area that is already draining. I recommended opening the area packing. Patient did give consent for this procedure after risks and benefits were discussed. Area was opened, but there was minimal drainage, as it was already spontaneously draining. I did probe the area, packing with iodoform gauze. Patient will return in 2 days for repeat evaluation, packing removal. She'll be initiated on Bactrim, return sooner if symptoms worsen.    Gilda Crease, MD 05/24/14 2259

## 2014-05-24 NOTE — ED Notes (Addendum)
Patient has purulent drainage coming from an open area under the left breast. patient has been seen on 3 other occassions for the same problem and treated with antibiotics by mouth.

## 2014-05-24 NOTE — ED Notes (Signed)
Pt c/o left breast abscess that started 2 days ago. States that it is draining/oozing clear with foul odor.

## 2014-05-27 ENCOUNTER — Encounter (HOSPITAL_COMMUNITY): Payer: Self-pay | Admitting: Emergency Medicine

## 2014-05-27 ENCOUNTER — Emergency Department (HOSPITAL_COMMUNITY)
Admission: EM | Admit: 2014-05-27 | Discharge: 2014-05-27 | Disposition: A | Discharge status: Home / Self Care | Payer: No Typology Code available for payment source | Attending: Emergency Medicine | Admitting: Emergency Medicine

## 2014-05-27 DIAGNOSIS — N61 Mastitis without abscess: Secondary | ICD-10-CM | POA: Insufficient documentation

## 2014-05-27 DIAGNOSIS — N611 Abscess of the breast and nipple: Secondary | ICD-10-CM

## 2014-05-27 DIAGNOSIS — Z792 Long term (current) use of antibiotics: Secondary | ICD-10-CM | POA: Insufficient documentation

## 2014-05-27 DIAGNOSIS — Z4801 Encounter for change or removal of surgical wound dressing: Secondary | ICD-10-CM | POA: Insufficient documentation

## 2014-05-27 NOTE — ED Notes (Signed)
Redness to left breast.  Still tender to touch.  Yellow purulent drainage noted.  Iodaform packing still in place.

## 2014-05-27 NOTE — ED Provider Notes (Signed)
CSN: [id number]     Arrival date & time 05/27/14  1910 History  This chart was scribed for non-physician practitioner working with Denise Shi, MD by Elveria Rising, ED Scribe. This patient was seen in room TR06C/TR06C and the patient's care was started at 9:15 PM.   Chief Complaint  Patient presents with  . Wound Check  . Abscess    The history is provided by the patient and medical records. No language interpreter was used.   HPI Comments: Denise Mosley is a 53 y.o. female who presents to the Emergency Department requesting repeat evaluation for an abscess below her left breast and packing removal. Patient was seen by Dr. Blinda Leatherwood on 05/24/14 at [facility] ED; there her abscess was incised, drained, packed with iodoform gauze. Patient was started on Bactrim and prescribed Vicodin for pain. She was instructed to return to ED in two days to have the packing removed.  Since her I&D patient reports more pus drainage and spreading redness over the left breast. The day after her ED visit patient reports subjective fever, redness and swelling of the left breast. The following day (yesterday) the fever had subsided and the other symptoms becagn to imiporove. Patient reports sleep disturbance due to severe pain. She has been taking Vicodin, but denies relief. Patient states the swelling and redness have improved since three days ago (when she visited the ED).   History reviewed. No pertinent past medical history. Past Surgical History  Procedure Laterality Date  . Cesarean section     No family history on file. History  Substance Use Topics  . Smoking status: Never Smoker   . Smokeless tobacco: Not on file  . Alcohol Use: No   OB History   Grav Para Term Preterm Abortions TAB SAB Ect Mult Living                 Review of Systems  Constitutional: Negative for fever and chills.  Skin: Positive for color change and wound.  Allergic/Immunologic: Negative for immunocompromised state.       Allergies  Review of patient's allergies indicates no known allergies.  Home Medications   Prior to Admission medications   Medication Sig Start Date End Date Taking? Authorizing Provider  HYDROcodone-acetaminophen (NORCO/VICODIN) 5-325 MG per tablet Take 1 tablet by mouth every 4 (four) hours as needed for moderate pain.    Historical Provider, MD  HYDROcodone-acetaminophen (NORCO/VICODIN) 5-325 MG per tablet Take 2 tablets by mouth every 4 (four) hours as needed for moderate pain. 05/24/14   Gilda Crease, MD  sulfamethoxazole-trimethoprim (BACTRIM DS) 800-160 MG per tablet Take 1 tablet by mouth 2 (two) times daily. 04/13/14   Hyacinth Meeker, MD  sulfamethoxazole-trimethoprim (SEPTRA DS) 800-160 MG per tablet Take 1 tablet by mouth every 12 (twelve) hours. 05/24/14   Gilda Crease, MD   Triage Vitals: BP 130/75  Pulse 71  Temp(Src) 98.4 F (36.9 C) (Oral)  Resp 18  SpO2 100%  Physical Exam  Nursing note and vitals reviewed. Constitutional: She appears well-developed and well-nourished. No distress.  HENT:  Head: Normocephalic and atraumatic.  Eyes: Conjunctivae are normal.  Neck: Normal range of motion. Neck supple.  Cardiovascular: Normal rate.   Pulmonary/Chest: Effort normal.  Left breast: Inferior lateral breast with packing. No active drainage. + Erythema contained to the breast, warmth and tenderness. No areas of induration of fluctuance noted.    Neurological: She is alert.  Skin: She is not diaphoretic.  Psychiatric: She has a normal  mood and affect. Her behavior is normal. Thought content normal.    ED Course  Procedures (including critical care time)  9:29 PM- Packing removed.   COORDINATION OF CARE: 9:34 PM- Discussed treatment plan with patient at bedside and patient agreed to plan.   Labs Review Labs Reviewed - No data to display  Imaging Review No results found.   EKG Interpretation None     \ Filed Vitals:   05/27/14 2149   BP: 116/66  Pulse: 76  Temp:   Resp: 17   Temp 98.4    MDM   Final diagnoses:  Left breast abscess  Cellulitis of breast   Afebrile, nontoxic patient with left breast cellulitis and abscess.  Pt was seen on 9/8 with breast abscess and I&D was done at that time.  The area apparently become much worse the next day with subjective fever and new erythema and edema; however, since then the fevers have resolved (x 2 days), and the erythema and edema have much improved.  She is taking bactrim as directed.  She also has vicodin at home but has only been taking 1 pill periodically as needed.  We discussed that she may take 1-2 Q 4 hrs PRN pain.  Pt agrees with this.  We discussed strict return precautions.  While she did get worse initially, she has been steadily improving since and I therefore feel this is not likely to be a failure of outpatient treatment - patient is comfortable continuing treatment at home.   D/C home with instructions to continue as previously instructed, breast center follow up.   Discussed result, findings, treatment, and follow up  with patient.  Pt given return precautions.  Pt verbalizes understanding and agrees with plan.       I personally performed the services described in this documentation, which was scribed in my presence. The recorded information has been reviewed and is accurate.    [place], PA-C 05/28/14 [phone]

## 2014-05-27 NOTE — ED Notes (Signed)
Patient here after having an I+D of left breast abscess done on Tuesday of this week. States that they were instructed to return to having packing removed and wound checked. States not acute change, reports has been taking ABX as ordered. States some mild pain at site denies further drainage. Dressing covering wound appears CDI. Denies fever.

## 2014-05-27 NOTE — Discharge Instructions (Signed)
Read the information below.  You may return to the Emergency Department at any time for worsening condition or any new symptoms that concern you.  Take the medication as previously prescribed.  If you develop increased redness, swelling, uncontrolled pain, or fevers greater than 100.4, return to the ER immediately for a recheck.    Lea la informacin a continuacin. Usted puede volver a la sala de Oceanographer en cualquier momento por el empeoramiento de la condicin o nuevos sntomas que le preocupan . Tome la medicacin segn lo prescrito anteriormente . Si usted desarrolla aumento del enrojecimiento , hinchazn, dolor incontrolado , o fiebre de ms de 100.4 , volver a la sala de emergencias de inmediato para un chequeo.   Absceso (Abscess)  Un absceso es una zona infectada que contiene pus y desechos.Puede aparecer en cualquier parte del cuerpo. Tambin se lo conoce como fornculo o divieso. CAUSAS  Ocurre cuando los tejidos se infectan. Tambin puede formarse por obstruccin de las glndulas sebceas o las glndulas sudorparas, infeccin de los folculos pilosos o por una lesin pequea en la piel. A medida que el organismo lucha contra la infeccin, se acumula pus en la zona y hace presin debajo de la piel. Esta presin causa dolor. Las personas con un sistema inmunolgico debilitado tienen dificultad para Industrial/product designer las infecciones y pueden formar abscesos con ms frecuencia.  SNTOMAS  Generalmente un absceso se forma sobre la piel y se vuelve una masa dolorosa, roja, caliente y sensible. Si se forma debajo de la piel, podr sentir como una zona blanda, que se [place], debajo de la piel. Algunos abscesos se abren (ruptura) por s mismos, pero la mayora seguir empeorando si no se lo trata. La infeccin puede diseminarse hacia otros sitios del cuerpo y finalmente al torrente sanguneo y hace que el enfermo se sienta mal.  DIAGNSTICO  El mdico le har una historia clnica y un examen fsico.  Podrn tomarle Lauris Poag de lquido del absceso y Public librarian para Clinical research associate la causa de la infeccin. .  TRATAMIENTO  El mdico le indicar antibiticos para combatir la infeccin. Sin embargo, el uso de antibiticos solamente no curar el absceso. El mdico tendr que hacer un pequeo corte (incisin) en el absceso para drenar el pus. En algunos casos se introduce una gasa en el absceso para reducir Chief Technology Officer y que siga drenando la zona.  INSTRUCCIONES PARA EL CUIDADO EN EL HOGAR   Solo tome medicamentos de venta libre o recetados para Chief Technology Officer, Dentist o fiebre, segn las indicaciones del mdico.  Si le han recetado antibiticos, tmelos segn las indicaciones. Tmelos todos, aunque se sienta mejor.  Si le aplicaron una gasa, siga las indicaciones del mdico para [place].  Para evitar la propagacin de la infeccin:  Mantenga el absceso cubierto con el vendaje.  Lvese bien las manos.  No comparta artculos de cuidado personal, toallas o jacuzzis con los dems.  Evite el contacto con la piel de Economist.  Mantenga la piel y la ropa limpia alrededor del absceso.  Cumpla con todas las visitas de control, segn le indique su mdico. SOLICITE ATENCIN MDICA SI:   Aumenta el dolor, la hinchazn, el enrojecimiento, drena lquido o sangra.  Siente dolores musculares, escalofros, o una sensacin general de Dentist.  Tiene fiebre. ASEGRESE DE QUE:   Comprende estas instrucciones.  Controlar su enfermedad.  Solicitar ayuda de inmediato si no mejora o si empeora. Document Released: 09/02/2005 Document Revised: 03/03/2012 [facility] Patient Information 2015 [place], [place]. This  information is not intended to replace advice given to you by your health care provider. Make sure you discuss any questions you have with your health care provider.  Celulitis (Cellulitis) La celulitis es una infeccin de la piel y del tejido subyacente. El rea infectada generalmente est de  color rojo y duele. Ocurre con ms frecuencia en los brazos y en las piernas.  CAUSAS  La causa es una bacteria que ingresa en la piel a travs de grietas o cortes. Los tipos ms frecuentes de bacterias que causan celulitis son los estafilococos y los estreptococos. SIGNOS Y SNTOMAS   Enrojecimiento y calor.  Hinchazn.  Sensibilidad o dolor.  Grant Ruts. DIAGNSTICO  Por lo general, el mdico puede diagnosticar esta afeccin con un examen fsico. Es posible que le indiquen anlisis de [place]. TRATAMIENTO  El tratamiento consiste en tomar antibiticos. INSTRUCCIONES PARA EL CUIDADO EN EL HOGAR   Tome los antibiticos como le indic el mdico. Termine los antibiticos aunque comience a sentirse mejor.  Mantenga el brazo o la pierna elevados para reducir la hinchazn.  Aplique paos calientes en la zona hasta 4 veces al da para Primary school teacher.  Tome los medicamentos solamente como se lo haya indicado el mdico.  Concurra a todas las visitas de control como se lo haya indicado el mdico. SOLICITE ATENCIN MDICA SI:   Brett Fairy lnea roja en la piel que sale desde la zona infectada.  El rea roja se extiende o se vuelve de color oscuro.  El hueso o la articulacin que se encuentran por debajo de la zona infectada le duelen despus de que la piel se [place].  La infeccin se repite en la misma zona o en una zona diferente.  Tiene un bulto inflamado en la zona infectada.  Presenta nuevos sntomas.  Tiene fiebre. SOLICITE ATENCIN MDICA DE INMEDIATO SI:   Se siente muy somnoliento.  Tiene vmitos o diarrea.  Se siente enfermo (malestar general) con dolores musculares. ASEGRESE DE QUE:   Comprende estas instrucciones.  Controlar su afeccin.  Recibir ayuda de inmediato si no mejora o si empeora. Document Released: 06/12/2005 Document Revised: 01/17/2014 [facility] Patient Information 2015 [place], [place]. This information is not intended to replace advice given to  you by your health care provider. Make sure you discuss any questions you have with your health care provider.

## 2014-05-28 NOTE — ED Provider Notes (Signed)
Medical screening examination/treatment/procedure(s) were performed by non-physician practitioner and as supervising physician I was immediately available for consultation/collaboration.   Lyssa Hackley L Sadi Arave, MD 05/28/14 1040 

## 2014-06-27 ENCOUNTER — Ambulatory Visit (HOSPITAL_COMMUNITY): Payer: Self-pay | Attending: Emergency Medicine

## 2014-06-27 ENCOUNTER — Encounter (HOSPITAL_COMMUNITY): Payer: Self-pay | Admitting: Emergency Medicine

## 2014-06-27 ENCOUNTER — Emergency Department (INDEPENDENT_AMBULATORY_CARE_PROVIDER_SITE_OTHER)
Admission: EM | Admit: 2014-06-27 | Discharge: 2014-06-27 | Disposition: A | Discharge status: Home / Self Care | Payer: Self-pay | Source: Home / Self Care | Attending: Emergency Medicine | Admitting: Emergency Medicine

## 2014-06-27 DIAGNOSIS — R05 Cough: Secondary | ICD-10-CM | POA: Insufficient documentation

## 2014-06-27 DIAGNOSIS — J019 Acute sinusitis, unspecified: Secondary | ICD-10-CM

## 2014-06-27 DIAGNOSIS — R509 Fever, unspecified: Secondary | ICD-10-CM | POA: Insufficient documentation

## 2014-06-27 DIAGNOSIS — J209 Acute bronchitis, unspecified: Secondary | ICD-10-CM

## 2014-06-27 MED ORDER — AMOXICILLIN 500 MG PO CAPS: 500.0000 mg | ORAL_CAPSULE | Freq: Three times a day (TID) | ORAL | Status: DC

## 2014-06-27 MED ORDER — IPRATROPIUM BROMIDE 0.06 % NA SOLN
2.0000 | Freq: Four times a day (QID) | NASAL | Status: DC
Start: 2014-06-27 — End: 2016-08-21

## 2014-06-27 MED ORDER — PREDNISONE 20 MG PO TABS: 20.0000 mg | ORAL_TABLET | Freq: Two times a day (BID) | ORAL | Status: DC

## 2014-06-27 MED ORDER — GUAIFENESIN-CODEINE 100-10 MG/5ML PO SYRP: 5.0000 mL | ORAL_SOLUTION | Freq: Three times a day (TID) | ORAL | Status: DC | PRN

## 2014-06-27 MED ORDER — ALBUTEROL SULFATE HFA 108 (90 BASE) MCG/ACT IN AERS: 1.0000 | INHALATION_SPRAY | Freq: Four times a day (QID) | RESPIRATORY_TRACT | Status: DC | PRN

## 2014-06-27 NOTE — ED Provider Notes (Signed)
Chief Complaint   Nasal Congestion and Cough   History of Present Illness   Denise Mosley is a 53 year old female who's had a one-week history of a cough productive of yellow sputum, chest tightness, wheezing, subjective fever, chills, sore throat, headache, chest pain, nasal congestion, and rhinorrhea. She has an inguinal hernia and ever she coughs the hernia pops out, but reduces easily. It is somewhat tender. She's had no nausea or vomiting. She speaks limited English and history was obtained with the help of a family interpreter.  Review of Systems   Other than as noted above, the patient denies any of the following symptoms: Systemic:  No fevers, chills, sweats, or myalgias. Eye:  No redness or discharge. ENT:  No ear pain, headache, nasal congestion, drainage, sinus pressure, or sore throat. Neck:  No neck pain, stiffness, or swollen glands. Lungs:  No cough, sputum production, hemoptysis, wheezing, chest tightness, shortness of breath or chest pain. GI:  No abdominal pain, nausea, vomiting or diarrhea.  PMFSH   Past medical history, family history, social history, meds, and allergies were reviewed.   Physical exam   Vital signs:  BP 128/74  Pulse 72  Temp(Src) 98.5 F (36.9 C) (Oral)  Resp 20  SpO2 96% General:  Alert and oriented.  In no distress.  Skin warm and dry. Eye:  No conjunctival injection or drainage. Lids were normal. ENT:  TMs and canals were normal, without erythema or inflammation.  Nasal mucosa was clear and uncongested, without drainage.  Mucous membranes were moist.  Pharynx was clear with no exudate or drainage.  There were no oral ulcerations or lesions. Neck:  Supple, no adenopathy, tenderness or mass. Lungs:  No respiratory distress.  Lungs were clear to auscultation, without wheezes, rales or rhonchi.  Breath sounds were clear and equal bilaterally.  Heart:  Regular rhythm, without gallops, murmers or rubs. Skin:  Clear, warm, and dry, without  rash or lesions.  Radiology   Dg Chest 2 View  06/27/2014   CLINICAL DATA:  53 year old female with acute cough and fever. Initial encounter.  EXAM: CHEST  2 VIEW  COMPARISON:  04/12/2014.  FINDINGS: Mild expiratory technique. Mild motion artifact. Lower lung volumes. No pneumothorax, pulmonary edema, pleural effusion or consolidation. Stable cardiac size and mediastinal contours. Visualized tracheal air column is within normal limits. No acute osseous abnormality identified.  IMPRESSION: Low lung volumes, otherwise no acute cardiopulmonary abnormality.   Electronically Signed   By: Augusto GambleLee  Hall M.D.   On: 06/27/2014 14:50   Assessment     The primary encounter diagnosis was Acute bronchitis, unspecified organism. A diagnosis of Acute sinusitis, recurrence not specified, unspecified location was also pertinent to this visit.  Plan    1.  Meds:  The following meds were prescribed:   Discharge Medication List as of 06/27/2014  3:51 PM    START taking these medications   Details  albuterol (PROVENTIL HFA;VENTOLIN HFA) 108 (90 BASE) MCG/ACT inhaler Inhale 1-2 puffs into the lungs every 6 (six) hours as needed for wheezing or shortness of breath., Starting 06/27/2014, Until Discontinued, Print    amoxicillin (AMOXIL) 500 MG capsule Take 1 capsule (500 mg total) by mouth 3 (three) times daily., Starting 06/27/2014, Until Discontinued, Print    guaiFENesin-codeine (ROBITUSSIN AC) 100-10 MG/5ML syrup Take 5 mLs by mouth 3 (three) times daily as needed for cough., Starting 06/27/2014, Until Discontinued, Print    ipratropium (ATROVENT) 0.06 % nasal spray Place 2 sprays into both nostrils 4 (four)  times daily., Starting 06/27/2014, Until Discontinued, Print    predniSONE (DELTASONE) 20 MG tablet Take 1 tablet (20 mg total) by mouth 2 (two) times daily., Starting 06/27/2014, Until Discontinued, Print        2.  Patient Education/Counseling:  The patient was given appropriate handouts, self care  instructions, and instructed in symptomatic relief.  Instructed to get extra fluids and extra rest.    3.  Follow up:  The patient was told to follow up here if no better in 3 to 4 days, or sooner if becoming worse in any way, and given some red flag symptoms such as increasing fever, difficulty breathing, chest pain, or persistent vomiting which would prompt immediate return.       Reuben Likesavid C Haru Shaff, MD 06/27/14 2132

## 2014-06-27 NOTE — Discharge Instructions (Signed)
Bronquitis aguda °(Acute Bronchitis) °La bronquitis es una inflamación de las vías respiratorias que se extienden desde la tráquea hasta los pulmones (bronquios). La inflamación produce la formación de mucosidad. Esto produce tos, que es el síntoma más frecuente de la bronquitis.  °Cuando la bronquitis es aguda, generalmente comienza de manera súbita y desaparece luego de un par de semanas. El hábito de fumar, las alergias y el asma pueden empeorar la bronquitis. Los episodios repetidos de bronquitis pueden causar más problemas pulmonares.  °CAUSAS °La causa más frecuente de bronquitis aguda es el mismo virus que produce el resfrío. El virus puede propagarse de una persona a la otra (contagioso) a través de la tos y los estornudos, y al tocar objetos contaminados. °SIGNOS Y SÍNTOMAS  °· Tos. °· Fiebre. °· Tos con mucosidad. °· Dolores en el cuerpo. °· Congestión en el pecho. °· Escalofríos. °· Falta de aire. °· Dolor de garganta. °DIAGNÓSTICO  °La bronquitis aguda en general se diagnostica con un examen físico. El médico también le hará preguntas sobre su historia clínica. En algunos casos se indican otros estudios, como radiografías, para descartar otras enfermedades.  °TRATAMIENTO  °La bronquitis aguda generalmente desaparece en un par de semanas. Con frecuencia, no es necesario realizar un tratamiento. Los medicamentos se indican para aliviar la fiebre o la tos. Generalmente, no es necesario el uso de antibióticos, pero pueden recetarse en ciertas ocasiones. En algunos casos, se recomienda el uso de un inhalador para mejorar la falta de aire y controlar la tos. Un vaporizador de aire frío podrá ayudarlo a disolver las secreciones bronquiales y facilitar su eliminación.  °INSTRUCCIONES PARA EL CUIDADO EN EL HOGAR  °· Descanse lo suficiente. °· Beba líquidos en abundancia para mantener la orina de color claro o amarillo pálido (excepto que padezca una enfermedad que requiera la restricción de líquidos). El aumento  de líquidos puede ayudar a que las secreciones respiratorias (esputo) sean menos espesas y a reducir la congestión del pecho, y evitará la deshidratación. °· Tome los medicamentos solamente como se lo haya indicado el médico. °· Si le recetaron antibióticos, asegúrese de terminarlos, incluso si comienza a sentirse mejor. °· Evite fumar o aspirar el humo de otros fumadores. La exposición al humo del cigarrillo o a irritantes químicos hará que la bronquitis empeore. Si fuma, considere el uso de goma de mascar o la aplicación de parches en la piel que contengan nicotina para aliviar los síntomas de abstinencia. Si deja de fumar, sus pulmones se curarán más rápido. °· Reduzca la probabilidad de otro episodio de bronquitis aguda lavando sus manos con frecuencia, evitando a las personas que tengan síntomas y tratando de no tocarse las manos con la boca, la nariz o los ojos. °· Concurra a todas las visitas de control como se lo haya indicado el médico. °SOLICITE ATENCIÓN MÉDICA SI: °Los síntomas no mejoran después de una semana de tratamiento.  °SOLICITE ATENCIÓN MÉDICA DE INMEDIATO SI: °· Comienza a tener fiebre o escalofríos cada vez más intensos. °· Siente dolor en el pecho. °· Le falta el aire de manera preocupante. °· La flema tiene sangre. °· Se deshidrata. °· Se desmaya o siente que va a desmayarse de forma repetida. °· Tiene vómitos que se repiten. °· Tiene un dolor de cabeza intenso. °ASEGÚRESE DE QUE:  °· Comprende estas instrucciones. °· Controlará su afección. °· Recibirá ayuda de inmediato si no mejora o si empeora. °Document Released: 09/02/2005 Document Revised: 01/17/2014 °ExitCare® Patient Information ©2015 ExitCare, LLC. This information is not intended to replace   advice given to you by your health care provider. Make sure you discuss any questions you have with your health care provider. ° °Sinusitis °(Sinusitis) °La sinusitis es el enrojecimiento, el dolor y la inflamación de los senos paranasales. Los  senos paranasales son bolsas de aire que se encuentran dentro de los huesos de la cara (por debajo de los ojos, en la mitad de la frente o por encima de los ojos). En los senos paranasales sanos, el moco puede drenar y el aire circula a través de ellos en su camino hacia la nariz. Sin embargo, cuando se inflaman, el moco y el aire quedan atrapados. Esto hace que se desarrollen bacterias y otros gérmenes que causan infección. °La sinusitis puede desarrollarse rápidamente y durar solo un tiempo corto (aguda) o continuar por un período largo (crónica). La sinusitis que dura más de 12 semanas se considera crónica.  °CAUSAS  °Las causas de la sinusitis son: °· Cualquier alergia que tenga. °· Las anomalías estructurales, como el desplazamiento del cartílago que separa las fosas nasales (desvío del tabique), que pueden disminuir el flujo de aire por la nariz y los senos paranasales, y afectar su drenaje. °· Las anomalías funcionales, como cuando los pequeños pelos (cilias) que se encuentran en los senos paranasales y que ayudan a eliminar el moco no funcionan correctamente o no están presentes. °SIGNOS Y SÍNTOMAS  °Los síntomas de la sinusitis aguda y crónica son los mismos. Los síntomas principales son el dolor y la presión alrededor de los senos paranasales afectados. Otros síntomas son: °· Dolor en los dientes superiores. °· Dolor de oídos. °· Dolor de cabeza. °· Mal aliento. °· Disminución del sentido del olfato y del gusto. °· Tos, que empeora al acostarse. °· Fatiga. °· Fiebre. °· Drenaje de moco espeso por la nariz, que generalmente es de color verde y puede contener pus (purulento). °· Hinchazón y calor en los senos paranasales afectados. °DIAGNÓSTICO  °Su médico le realizará un examen físico. Durante el examen, el médico: °· Le revisará la nariz para buscar signos de crecimientos anormales en las fosas nasales (pólipos nasales). °· Palpará los senos paranasales afectados para buscar signos de infección. °· Verá el  interior de los senos paranasales (endoscopia) a través de un dispositivo de obtención de imágenes que tiene una [place] conectada (endoscopio). °Si el médico sospecha que usted sufre sinusitis crónica, podrá indicar una o más de las siguientes pruebas: °· Pruebas de alergia. °· Cultivo de las secreciones nasales. Se extrae una muestra de moco de la nariz, que se envía al laboratorio para detectar bacterias. °· Citología nasal. Se extrae una muestra de moco de la nariz, que el médico examina para determinar si la sinusitis está relacionada con una alergia. °TRATAMIENTO  °La mayoría de los casos de sinusitis aguda se deben a una infección viral y se resuelven espontáneamente en un período de 10 días. En algunos casos, se recetan medicamentos para aliviar los síntomas (analgésicos, descongestivos, aerosoles nasales con corticoides o aerosoles salinos).  °Sin embargo, para la sinusitis por infección bacteriana, el médico le recetará antibióticos. Los antibióticos son medicamentos que destruyen las bacterias que causan la infección.  °Con poca frecuencia, la sinusitis tiene su origen en una infección por hongos. En estos casos, el médico recetará un medicamento antimicótico. °Para algunos casos de sinusitis crónica, es necesario someterse a una cirugía. Generalmente, se trata de casos en los que la sinusitis se repite más de 3 veces al año, a pesar de otros tratamientos. °INSTRUCCIONES PARA EL CUIDADO EN EL HOGAR   °·   Beber gran cantidad de lquidos. Los lquidos ayudan a Dillard'sdisolver el moco, para que drene ms fcilmente de los senos paranasales.  Use un humidificador.  Inhale vapor de 3 a 4 veces al da (por ejemplo, sintese en el bao con la ducha abierta).  Aplquese un pao tibio y hmedo en la cara 3 o 4 veces al da o segn las indicaciones del mdico.  Use un aerosol nasal salino para ayudar a Environmental education officerhumedecer y Duke Energylimpiar los senos nasales.  Tome los medicamentos solamente como se lo haya indicado el mdico.  Si le  recetaron un antimictico o un antibitico, asegrese de terminarlos, incluso si comienza a sentirse mejor. SOLICITE ATENCIN MDICA DE INMEDIATO SI:  Siente ms dolor o sufre dolores de cabeza intensos.  Tiene nuseas, vmitos o somnolencia.  Observa hinchazn alrededor del rostro.  Tiene problemas de visin.  Presenta rigidez en el cuello.  Tiene dificultad para respirar. ASEGRESE DE QUE:   Comprende estas instrucciones.  Controlar su afeccin.  Recibir ayuda de inmediato si no mejora o si empeora. Document Released: 06/12/2005 Document Revised: 01/17/2014 [facility] Patient Information 2015 [place]ExitCare, MarylandLLC. This information is not intended to replace advice given to you by your health care provider. Make sure you discuss any questions you have with your health care provider. Cmo usar Advertising account executiveun inhalador (How to Use an Inhaler) Es muy importante usar una tcnica adecuada para Therapist, nutritionalel inhalador. Una buena tcnica garantiza que el medicamento llegue a los pulmones. Una tcnica inadecuada da como resultado que el medicamento se deposite en la lengua y en la parte posterior de la garganta en lugar de llegar a las vas respiratorias. Si no aplica una buena tcnica a la hora de Chesapeake Energyusar el inhalador, el medicamento no lo ayudar. PASOS QUE DEBE SEGUIR SI [place] UN INHALADOR SIN TUBO DE EXTENSIN 1. Retire la tapa del inhalador. 2. Si [place] el inhalador por primera vez, ser necesario que lo prepare. Sacuda el inhalador durante 5 segundos y libere cuatro descargas en el aire, lejos del [place] Crossroadsrostro. Consulte a su mdico o farmacutico si tiene preguntas acerca de la preparacin del inhalador. 3. Sacuda el inhalador durante 5segundos antes de cada inspiracin (inhalacin). 4. Coloque el inhalador de [employer]	[place]o que la parte superior del envase quede [place] arriba. 5. Coloque el dedo ndice en la parte superior del envase del medicamento. El pulgar sostiene la parte inferior del [place]inhalador. 6. Abra la boca. 7. Coloque el  inhalador The Krogerentre los dientes y apriete los labios fuertemente alrededor de la boquilla, o sostenga el inhalador a una distancia de 1 a 2pulgadas (2,5 a 5cm) de la boca abierta. Si no est seguro de Economistqu tcnica usar, consulte a su mdico. 8. Espire (exhale) normalmente y lo ms profundamente posible. 9. Presione el envase hacia abajo con el dedo ndice para Pharmacologistliberar el medicamento. 10. Al mismo tiempo que presiona el envase, inhale profunda y lentamente hasta que los pulmones se llenen completamente. Esto debe llevarle de 4 a 6segundos. Mantenga la lengua abajo. 11. Mantenga el medicamento en los pulmones entre 5 y 10segundos (10segundos es lo ms indicado). Esto ayuda a que el medicamento ingrese en las vas respiratorias pequeas y en los pulmones. 12. Exhale lentamente a travs de los labios fruncidos. Ponga los labios como cuando silba. 13. Entre cada descarga, espere de 15 a 30 segundos, como mnimo. Contine con los pasos indicados anteriormente hasta que haya tomado el nmero de descargas que el mdico le indic. No use el inhalador ms veces de las que su  mdico le indique. 14. Vuelva a colocar la tapa del inhalador. 15. Siga las indicaciones de su mdico o las instrucciones que vienen con el inhalador para limpiarlo. PASOS QUE DEBE SEGUIR SI [place] UN INHALADOR CON EXTENSIN (ESPACIADOR) 1. Retire la tapa del inhalador. 2. Si [place] el inhalador por primera vez, ser necesario que lo prepare. Sacuda el inhalador durante 5 segundos y libere cuatro descargas en el aire, lejos del [place]. Consulte a su mdico o farmacutico si tiene preguntas acerca de la preparacin del inhalador. 3. Sacuda el inhalador durante 5segundos antes de cada inspiracin (inhalacin). 4. Coloque el extremo abierto del Armed forces training and education officer en la boquilla del inhalador. 5. Coloque el inhalador de modo que la parte superior del envase quede hacia arriba y la boquilla del espaciador delante suyo. 6. Coloque el dedo ndice en la parte  superior del envase del medicamento. El pulgar sostiene la parte inferior del inhalador y Engineer, civil (consulting). 7. Espire (exhale) normalmente y lo ms profundamente posible. 8. Inmediatamente despus de exhalar, coloque el espaciador entre sus dientes y [place] de su boca. Apriete los labios alrededor Therapist, sports. 9. Presione el envase hacia abajo con el dedo ndice para Pharmacologist. 10. Al mismo tiempo que presiona el envase, inhale profunda y lentamente hasta que los pulmones se llenen completamente. Esto debe llevarle de 4 a 6segundos. Mantenga la lengua abajo y [place]. 11. Mantenga el medicamento en los pulmones entre 5 y 10segundos (10segundos es lo ms indicado). Esto ayuda a que el medicamento ingrese en las vas respiratorias pequeas y en los pulmones. Exhale. 12. Repita la inhalacin profundamente a travs de la boquilla del espaciador. Contenga la respiracin por lo menos 10 segundos (10 segundos es lo ms indicado). Exhale lentamente. Si le resulta difcil tomar esta segunda inhalacin profunda a travs del espaciador, respire normalmente varias veces a travs del mismo. Retire el espaciador de su boca. 13. Entre cada descarga, espere de 15 a 30 segundos, como mnimo. Contine con los pasos indicados anteriormente hasta que haya tomado el nmero de descargas que el mdico le indic. No use el inhalador ms veces de las que su mdico le indique. 14. Retire el espaciador del inhalador y colquele la tapa al Armed forces operational officer. 15. Siga las indicaciones de su mdico o las instrucciones del manual para Building control surveyor y Engineer, civil (consulting). Si [place] diferentes tipos de inhaladores, use el medicamento de alivio rpido para abrir las vas respiratorias 10 a antes de usar un corticoide, si el mdico se lo indic. Si no est seguro de Engineer, maintenance y le indicaron usarlo, consulte a su mdico, enfermera o terapeuta en vas respiratorias. Si [place] un inhalador con corticoides,  enjuguese siempre la boca con agua despus de la ltima descarga, hgase grgaras y escupa el agua. No trague el agua. EVITE LO SIGUIENTE:  Inhalar antes o despus de comenzar el aerosol del medicamento. Es necesario tener prctica para coordinar la respiracin con la emisin del aerosol.  Al emitir el aerosol, inhale por la nariz (ms que por la boca). CMO SABER SI EL INHALADOR EST LLENO O VACO No puede saber cundo un inhalador est vaco al sacudirlo. Algunos inhaladores ahora vienen con contador de dosis. Pdale a su mdico que le recete el que tiene el contador de dosis si siente que necesita ayuda extra. Si el inhalador no tiene Medical illustrator, pdale a su mdico que lo ayude a Development worker, community a llenarlo. Anote la fecha en que deber rellenarlo  en un almanaque o en el envase del inhalador. Rellene el inhalador de 7 a 10das antes de que se termine. Asegrese de Pharmacologistmantener un adecuado suministro del medicamento. Esto incluye asegurarse de que no est vencido y Nurse, children'stener un inhalador de repuesto.  SOLICITE ATENCIN MDICA SI:   Los sntomas solo se Scientific laboratory technicianalivian parcialmente con Therapist, nutritionalel inhalador.  Tiene dificultad para Academic librarianusar el inhalador.  Experimenta un leve aumento de la flema. SOLICITE ATENCIN MDICA DE INMEDIATO SI:   Siente poco alivio con el uso de los [place]inhaladores, o no tiene [place]ingn alivio. Contina con sibilancias y siente que le falta el aire, tiene opresin en el [place]cho, o ambos.  Siente mareos, dolor de cabeza o una frecuencia cardaca acelerada.  Siente escalofros, fiebre o sudores nocturnos.  Nota un aumento en la produccin de flema o hay sangre en la flema. ASEGRESE DE QUE:   Comprende estas instrucciones.  Controlar su afeccin.  Recibir ayuda de inmediato si no mejora o si empeora. Document Released: 09/02/2005 Document Revised: 01/17/2014 [facility]ayerExit[facility] Patient Information 2015 [place]ExitCare, MarylandLLC. This information is not intended to replace advice given to  you by your health care provider. Make sure you discuss any questions you have with your health care provider.

## 2014-06-27 NOTE — ED Notes (Signed)
Pt c/o cough, congestion and suspected fever times 1 week.  Pt states the coughing has caused her chest, back and throat to be sore.

## 2014-09-17 ENCOUNTER — Emergency Department (HOSPITAL_COMMUNITY)
Admission: EM | Admit: 2014-09-17 | Discharge: 2014-09-18 | Disposition: A | Discharge status: Home / Self Care | Payer: Self-pay | Attending: Emergency Medicine | Admitting: Emergency Medicine

## 2014-09-17 ENCOUNTER — Encounter (HOSPITAL_COMMUNITY): Payer: Self-pay | Admitting: Emergency Medicine

## 2014-09-17 DIAGNOSIS — Z79899 Other long term (current) drug therapy: Secondary | ICD-10-CM | POA: Insufficient documentation

## 2014-09-17 DIAGNOSIS — R112 Nausea with vomiting, unspecified: Secondary | ICD-10-CM | POA: Insufficient documentation

## 2014-09-17 DIAGNOSIS — R1111 Vomiting without nausea: Secondary | ICD-10-CM

## 2014-09-17 DIAGNOSIS — H65193 Other acute nonsuppurative otitis media, bilateral: Secondary | ICD-10-CM | POA: Insufficient documentation

## 2014-09-17 DIAGNOSIS — Z7952 Long term (current) use of systemic steroids: Secondary | ICD-10-CM | POA: Insufficient documentation

## 2014-09-17 DIAGNOSIS — E119 Type 2 diabetes mellitus without complications: Secondary | ICD-10-CM | POA: Insufficient documentation

## 2014-09-17 DIAGNOSIS — Z792 Long term (current) use of antibiotics: Secondary | ICD-10-CM | POA: Insufficient documentation

## 2014-09-17 DIAGNOSIS — I10 Essential (primary) hypertension: Secondary | ICD-10-CM | POA: Insufficient documentation

## 2014-09-17 HISTORY — DX: Essential (primary) hypertension: I10

## 2014-09-17 HISTORY — DX: Type 2 diabetes mellitus without complications: E11.9

## 2014-09-17 LAB — CBC WITH DIFFERENTIAL/PLATELET
BASOS ABS: 0 10*3/uL (ref 0.0–0.1)
Basophils Relative: 0 % (ref 0–1)
EOS PCT: 1 % (ref 0–5)
Eosinophils Absolute: 0.1 10*3/uL (ref 0.0–0.7)
HEMATOCRIT: 43.1 % (ref 36.0–46.0)
Hemoglobin: 14.7 g/dL (ref 12.0–15.0)
Lymphocytes Relative: 30 % (ref 12–46)
Lymphs Abs: 2.4 10*3/uL (ref 0.7–4.0)
MCH: 29.6 pg (ref 26.0–34.0)
MCHC: 34.1 g/dL (ref 30.0–36.0)
MCV: 86.7 fL (ref 78.0–100.0)
Monocytes Absolute: 0.5 10*3/uL (ref 0.1–1.0)
Monocytes Relative: 6 % (ref 3–12)
Neutro Abs: 5 10*3/uL (ref 1.7–7.7)
Neutrophils Relative %: 63 % (ref 43–77)
Platelets: 224 10*3/uL (ref 150–400)
RBC: 4.97 MIL/uL (ref 3.87–5.11)
RDW: 13 % (ref 11.5–15.5)
WBC: 8 10*3/uL (ref 4.0–10.5)

## 2014-09-17 LAB — COMPREHENSIVE METABOLIC PANEL
ALBUMIN: 3.7 g/dL (ref 3.5–5.2)
ALK PHOS: 172 U/L — AB (ref 39–117)
ALT: 69 U/L — AB (ref 0–35)
ANION GAP: 10 (ref 5–15)
AST: 72 U/L — ABNORMAL HIGH (ref 0–37)
BUN: 8 mg/dL (ref 6–23)
CALCIUM: 9.2 mg/dL (ref 8.4–10.5)
CO2: 25 mmol/L (ref 19–32)
Chloride: 100 mEq/L (ref 96–112)
Creatinine, Ser: 0.6 mg/dL (ref 0.50–1.10)
GFR calc non Af Amer: >90 mL/min (ref >90)
GLUCOSE: 158 mg/dL — AB (ref 70–99)
Potassium: 3.4 mmol/L — ABNORMAL LOW (ref 3.5–5.1)
Sodium: 135 mmol/L (ref 135–145)
Total Bilirubin: 0.5 mg/dL (ref 0.3–1.2)
Total Protein: 8 g/dL (ref 6.0–8.3)

## 2014-09-17 NOTE — ED Notes (Signed)
Pt. reports nausea / emesis( x5) onset this morning with fatigue and bilteral otalgia onset last night.

## 2014-09-18 LAB — URINALYSIS, ROUTINE W REFLEX MICROSCOPIC
Bilirubin Urine: NEGATIVE
GLUCOSE, UA: NEGATIVE mg/dL
HGB URINE DIPSTICK: NEGATIVE
KETONES UR: NEGATIVE mg/dL
LEUKOCYTES UA: NEGATIVE
NITRITE: NEGATIVE
PH: 6 (ref 5.0–8.0)
Protein, ur: NEGATIVE mg/dL
SPECIFIC GRAVITY, URINE: 1.018 (ref 1.005–1.030)
Urobilinogen, UA: 0.2 mg/dL (ref 0.0–1.0)

## 2014-09-18 LAB — LIPASE, BLOOD: LIPASE: 22 U/L (ref 11–59)

## 2014-09-18 MED ORDER — ONDANSETRON 4 MG PO TBDP: 4.0000 mg | ORAL_TABLET | Freq: Three times a day (TID) | ORAL | Status: DC | PRN

## 2014-09-18 MED ORDER — AMOXICILLIN 500 MG PO CAPS: 500.0000 mg | ORAL_CAPSULE | Freq: Three times a day (TID) | ORAL | Status: DC

## 2014-09-18 MED ORDER — ONDANSETRON 4 MG PO TBDP
4.0000 mg | ORAL_TABLET | Freq: Once | ORAL | Status: AC
  Administered 2014-09-18: 4 mg via ORAL
  Filled 2014-09-18: qty 1

## 2014-09-18 NOTE — ED Provider Notes (Signed)
CSN: [id number]     Arrival date & time 09/17/14  2042 History   First MD Initiated Contact with Patient 09/17/14 2321     Chief Complaint  Patient presents with  . Emesis     (Consider location/radiation/quality/duration/timing/severity/associated sxs/prior Treatment) Patient is a 54 y.o. female presenting with vomiting. The history is provided by the patient. No language interpreter was used.  Emesis Severity:  Moderate Duration:  1 day Timing:  Constant Number of daily episodes:  5 Progression:  Worsening Chronicity:  New Recent urination:  Normal Relieved by:  Nothing Worsened by:  Nothing tried Ineffective treatments:  None tried Associated symptoms: no abdominal pain   Risk factors: no sick contacts   Pt complains of pain in both ears  Past Medical History  Diagnosis Date  . Hypertension   . Diabetes mellitus without complication    Past Surgical History  Procedure Laterality Date  . Cesarean section    . Cholecystectomy     No family history on file. History  Substance Use Topics  . Smoking status: Never Smoker   . Smokeless tobacco: Not on file  . Alcohol Use: No   OB History    No data available     Review of Systems  Gastrointestinal: Positive for vomiting. Negative for abdominal pain.  All other systems reviewed and are negative.     Allergies  Review of patient's allergies indicates no known allergies.  Home Medications   Prior to Admission medications   Medication Sig Start Date End Date Taking? Authorizing Provider  albuterol (PROVENTIL HFA;VENTOLIN HFA) 108 (90 BASE) MCG/ACT inhaler Inhale 1-2 puffs into the lungs every 6 (six) hours as needed for wheezing or shortness of breath. 06/27/14   Reuben Likes, MD  amoxicillin (AMOXIL) 500 MG capsule Take 1 capsule (500 mg total) by mouth 3 (three) times daily. 06/27/14   Reuben Likes, MD  guaiFENesin-codeine [facility]) 100-10 MG/5ML syrup Take 5 mLs by mouth 3 (three) times daily as  needed for cough. 06/27/14   Reuben Likes, MD  HYDROcodone-acetaminophen (NORCO/VICODIN) 5-325 MG per tablet Take 1 tablet by mouth every 4 (four) hours as needed for moderate pain.    Historical Provider, MD  HYDROcodone-acetaminophen (NORCO/VICODIN) 5-325 MG per tablet Take 2 tablets by mouth every 4 (four) hours as needed for moderate pain. 05/24/14   Gilda Crease, MD  ipratropium (ATROVENT) 0.06 % nasal spray Place 2 sprays into both nostrils 4 (four) times daily. 06/27/14   Reuben Likes, MD  predniSONE (DELTASONE) 20 MG tablet Take 1 tablet (20 mg total) by mouth 2 (two) times daily. 06/27/14   Reuben Likes, MD  sulfamethoxazole-trimethoprim (BACTRIM DS) 800-160 MG per tablet Take 1 tablet by mouth 2 (two) times daily. 04/13/14   Hyacinth Meeker, MD  sulfamethoxazole-trimethoprim (SEPTRA DS) 800-160 MG per tablet Take 1 tablet by mouth every 12 (twelve) hours. 05/24/14   Gilda Crease, MD   BP 119/64 mmHg  Pulse 69  Temp(Src) 99.1 F (37.3 C) (Oral)  Resp 14  SpO2 96% Physical Exam  Constitutional: She is oriented to person, place, and time. She appears well-developed and well-nourished.  HENT:  Head: Normocephalic.  Mouth/Throat: Oropharynx is clear and moist.  bilat tm's erythematous and bulging   Eyes: Conjunctivae and EOM are normal. Pupils are equal, round, and reactive to light.  Neck: Normal range of motion.  Pulmonary/Chest: Effort normal.  Abdominal: She exhibits no distension.  Musculoskeletal: Normal range of motion.  Neurological: She is alert and oriented to person, place, and time.  Psychiatric: She has a normal mood and affect.  Nursing note and vitals reviewed.   ED Course  Procedures (including critical care time) Labs Review Labs Reviewed  COMPREHENSIVE METABOLIC PANEL - Abnormal; Notable for the following:    Potassium 3.4 (*)    Glucose, Bld 158 (*)    AST 72 (*)    ALT 69 (*)    Alkaline Phosphatase 172 (*)    All other components  within normal limits  CBC WITH DIFFERENTIAL  LIPASE, BLOOD  URINALYSIS, ROUTINE W REFLEX MICROSCOPIC    Imaging Review No results found.   EKG Interpretation None      MDM  I will treat ear infection with amoxicilian.   Pt advised to schedule appointment for complete exam.   Pt advised to return if abdominal pain worsens or changes   Final diagnoses:  Acute nonsuppurative otitis media of both ears  Vomiting without nausea, vomiting of unspecified type    amoxicillian physicain referral.  I advised need for follow up of liver function test    Elson Areas, PA-C 09/18/14 [id number]  [name], MD 09/22/14 1319

## 2014-09-18 NOTE — Discharge Instructions (Signed)
Liver Profile A liver profile is a battery of tests which helps your caregiver evaluate your liver function. The following tests are often included in the liver profile: Alanine aminotransferase (ALT or SGPT) This is an enzyme found primarily in the liver. Abnormalities may represent liver disease. This is found in cells of the liver so when it is elevated, it has been released by damaged cells. Albumin - The serum albumin is one of the major proteins in the blood and a reflection of the general state of nutrition. This is low when the liver is unable to do its job. It is also low when protein is lost in the urine. NORMAL FINDINGS Adult/Elderly Total protein: 6.4-8.3 g/dL or 40-98 g/L (SI units) Albumin: 3.5-5 g/dL or 11-91 g/L (SI units) Globulin: 2.3-3.4 g/dL Alpha1 globulin: 4.7-8.2 g/dL or 1-3 g/L (SI units) Alpha2 globulin: 0.6-1 g/dL or 9-56 g/L (SI units) Beta globulin: 0.7-1.1 g/dL or 2-13 g/L (SI units) Children Total protein Premature infant: 4.2-7.6 g/dL Newborn: 0.8-6.5 g/dL Infant: 7-8.4 g/dL Child: 6.9-6 g/dL Albumin Premature infant: 3-4.2 g/dL Newborn: 2.9-5.2 g/dL Infant: 8.4-1.3 g/dL Child: 2-4.4 g/dL Albumin/Globulin ratio - Calculated by dividing the albumin by the globulin. It is a measure of well being.  Alkaline phosphatase - This is an enzyme which is important in diagnosing proper bone and liver functions. NORMAL FINDINGS Age / Normal Value (units/L) 0-5 days / 35-140 Less than 3 yr / 15-60 3-6 yr / 15-50 6-12 yr / 10-50 12-18 yr / 10-40 Adult / 0-35 units/L or 0-0.58 microKat/L (SI Units) (Females tend to have slightly lower levels than males) Elderly / Slightly higher than adults Aspartate aminotransferase (AST or SGOT) - an enzyme found in skeletal and heart muscle, liver and other organs. Abnormalities may represent liver disease. This is found in cells of the liver so when it is elevated, it has been released by damaged cells. Bilirubin, Total: A  chemical involved with liver functions. High concentrations may result in jaundice. Jaundice is a yellowing of the skin and the whites of the eyes. NORMAL FINDINGS Blood Adult/elderly/child Total bilirubin: 0.3-1.0 mg/dL or 0.1-02 micromole/L (SI units) Indirect bilirubin: 0.2-0.8 mg/dL or 7.2-53.6 micromole/L (SI units) Direct bilirubin: 0.1-0.3 mg/dL or 6.4-4.0 micromole/L (SI units) Newborn total bilirubin: 1.0-12.0 mg/dL or 34.7-425 micromole/L (SI units) Urine0-0.02 mg/dL Ranges for normal findings may vary among different laboratories and hospitals. You should always check with your doctor after having lab work or other tests done to discuss the meaning of your test results and whether your values are considered within normal limits PREPARATION FOR TEST No preparation or fasting is necessary unless you have been informed otherwise. A blood sample is obtained by inserting a needle into a vein in the arm. MEANING OF TEST  Your caregiver will go over the test results with you and discuss the importance and meaning of your results, as well as treatment options and the need for additional tests if necessary. OBTAINING THE TEST RESULTS It is your responsibility to obtain your test results. Ask the lab or department performing the test when and how you will get your results. Document Released: 10/05/2004 Document Revised: 11/25/2011 Document Reviewed: 01/04/2014 [facility] Patient Information 2015 [place], [place]. This information is not intended to replace advice given to you by your health care provider. Make sure you discuss any questions you have with your health care provider. Abdominal Pain Many things can cause abdominal pain. Usually, abdominal pain is not caused by a disease and will improve without treatment.  It can often be observed and treated at home. Your health care provider will do a physical exam and possibly order blood tests and X-rays to help determine the seriousness of your  pain. However, in many cases, more time must pass before a clear cause of the pain can be found. Before that point, your health care provider may not know if you need more testing or further treatment. HOME CARE INSTRUCTIONS  Monitor your abdominal pain for any changes. The following actions may help to alleviate any discomfort you are experiencing: Only take over-the-counter or prescription medicines as directed by your health care provider. Do not take laxatives unless directed to do so by your health care provider. Try a clear liquid diet (broth, tea, or water) as directed by your health care provider. Slowly move to a bland diet as tolerated. SEEK MEDICAL CARE IF: You have unexplained abdominal pain. You have abdominal pain associated with nausea or diarrhea. You have pain when you urinate or have a bowel movement. You experience abdominal pain that wakes you in the night. You have abdominal pain that is worsened or improved by eating food. You have abdominal pain that is worsened with eating fatty foods. You have a fever. SEEK IMMEDIATE MEDICAL CARE IF:  Your pain does not go away within 2 hours. You keep throwing up (vomiting). Your pain is felt only in portions of the abdomen, such as the right side or the left lower portion of the abdomen. You pass bloody or black tarry stools. MAKE SURE YOU: Understand these instructions.  Will watch your condition.  Will get help right away if you are not doing well or get worse.  Document Released: 06/12/2005 Document Revised: 09/07/2013 Document Reviewed: 05/12/2013 [facility] Patient Information 2015 [place], [place]. This information is not intended to replace advice given to you by your health care provider. Make sure you discuss any questions you have with your health care provider. Otitis Media Otitis media is redness, soreness, and inflammation of the middle ear. Otitis media may be caused by allergies or, most commonly, by infection.  Often it occurs as a complication of the common cold. SIGNS AND SYMPTOMS Symptoms of otitis media may include:  Earache.  Fever.  Ringing in your ear.  Headache.  Leakage of fluid from the ear. DIAGNOSIS To diagnose otitis media, your health care provider will examine your ear with an otoscope. This is an instrument that allows your health care provider to see into your ear in order to examine your eardrum. Your health care provider also will ask you questions about your symptoms. TREATMENT  Typically, otitis media resolves on its own within 3-5 days. Your health care provider may prescribe medicine to ease your symptoms of pain. If otitis media does not resolve within 5 days or is recurrent, your health care provider may prescribe antibiotic medicines if he or she suspects that a bacterial infection is the cause. HOME CARE INSTRUCTIONS   If you were prescribed an antibiotic medicine, finish it all even if you start to feel better.  Take medicines only as directed by your health care provider.  Keep all follow-up visits as directed by your health care provider. SEEK MEDICAL CARE IF:  You have otitis media only in one ear, or bleeding from your nose, or both.  You notice a lump on your neck.  You are not getting better in 3-5 days.  You feel worse instead of better. SEEK IMMEDIATE MEDICAL CARE IF:   You have pain that  is not controlled with medicine.  You have swelling, redness, or pain around your ear or stiffness in your neck.  You notice that part of your face is paralyzed.  You notice that the bone behind your ear (mastoid) is tender when you touch it. MAKE SURE YOU:   Understand these instructions.  Will watch your condition.  Will get help right away if you are not doing well or get worse. Document Released: 06/07/2004 Document Revised: 01/17/2014 Document Reviewed: 03/30/2013 [facility] Patient Information 2015 [place], [place]. This information is not intended to  replace advice given to you by your health care provider. Make sure you discuss any questions you have with your health care provider.  Emergency Department Resource Guide 1) Find a Doctor and Pay Out of Pocket Although you won't have to find out who is covered by your insurance plan, it is a good idea to ask around and get recommendations. You will then need to call the office and see if the doctor you have chosen will accept you as a new patient and what types of options they offer for patients who are self-pay. Some doctors offer discounts or will set up payment plans for their patients who do not have insurance, but you will need to ask so you aren't surprised when you get to your appointment.  2) Contact Your Local Health Department Not all health departments have doctors that can see patients for sick visits, but many do, so it is worth a call to see if yours does. If you don't know where your local health department is, you can check in your phone book. The CDC also has a tool to help you locate your state's health department, and many state websites also have listings of all of their local health departments.  3) Find a Walk-in Clinic If your illness is not likely to be very severe or complicated, you may want to try a walk in clinic. These are popping up all over the country in pharmacies, drugstores, and shopping centers. They're usually staffed by nurse practitioners or physician assistants that have been trained to treat common illnesses and complaints. They're usually fairly quick and inexpensive. However, if you have serious medical issues or chronic medical problems, these are probably not your best option.  No Primary Care Doctor: - Call Health Connect at  [phone] - they can help you locate a primary care doctor that  accepts your insurance, provides certain services, etc. - Physician Referral Service- [phone]  Chronic Pain Problems: Organization         Address  Phone    Notes  [facility]  [phone] Patients need to be referred by their primary care doctor.   Medication Assistance: Organization         Address  Phone   Notes  [facility] Medication [facility] [address] [place], [place] [address] [phone] --Must be a resident of [facility] -- Must have NO insurance coverage whatsoever (no Medicaid/ Medicare, etc.) -- The pt. MUST have a primary care doctor that directs their care regularly and follows them in the community   [facility]  [phone]   [employer]  [phone]    Agencies that provide inexpensive medical care: Organization         Address  Phone   Notes  [facility]  [phone]   [facility]    [phone]   [facility] [address] [place], [place] [address] [phone]   [facility] [address] [place] [place] [phone]   [facility]anned Parenthood    [phone]   [facility]    [phone]   [facility]  [address], [place] Phone:  [phone], Fax:  [phone] Hours of Operation:  9 am - 6 pm, M-F.  Also accepts Medicaid/Medicare and self-pay.  [facility]  [address], [place] Phone: [phone], Fax: [phone]. Hours of Operation:  8:30 am - 5:30 pm, M-F.  Also accepts Medicaid and self-pay.  [facility] [place] [address] [place] Phone: [phone]   [facility] [address] Natasha Bence [place], [place] [phone], Ext. [phone] Mondays & Thursdays: 7-9 AM.  First 15 patients are seen on a first come, first serve basis.    Medicaid-accepting [facility] Providers:  Organization         Address  Phone   Notes  [facility] [address], [place] [phone] Also accepts self-pay patients.  [facility]  [address] Laurell Josephs [place], [place]  [phone]   [facility] [address], [place] [phone]   [facility] [address] [place] [phone]   Renaye Rakers [address], [place]   [phone] Only accepts [place] Access [place] patients after they have their name applied to their card.   Self-Pay (no insurance) in [facility]:  Organization         Address  Phone   Notes  Sickle Cell Patients, [facility] [address] [place], [place] [phone]   [facility] [address] [place], [place] [phone]   [facility]  [address], [place] [phone]   [facility]/Dr. Osei-Bonsu  [address], [place] or [address], [place] [phone] Phone number for both [place] and [place] locations is the same.  [facility] [address] [place] [phone]   [facility] [address] [place] or [address] [phone]   [facility] [address] [employer] [phone], phone; [phone], fax Sees patients 1st and 3rd Saturday of every month.  Must not qualify for public or private insurance (i.e. Medicaid, Medicare, [place] Health Choice, Veterans' Benefits)  Household income should be no more than 200% of the poverty level The clinic cannot treat you if you are pregnant or think you are pregnant  Sexually transmitted diseases are not treated at the clinic.    Dental Care: Organization         Address  Phone  Notes  [facility] [address] [place], [place] [phone] Accepts children up to age 65 who are enrolled in [place] or [place] Health Choice; pregnant women with a Medicaid card; and children who have  applied for Medicaid or [place] Health Choice, but were declined, whose parents can pay a reduced fee at time of service.  [facility] of [facility]  [address], [place] [phone] Accepts children up to age 28 who are enrolled in  Medicaid or [place] Health Choice; pregnant women with a Medicaid card; and children who have applied for Medicaid or [place] Health Choice, but were declined, whose parents can pay a reduced fee at time of service.  [facility]  [address] [place], [place] [phone] Patients are seen by appointment only. Walk-ins are not accepted. [facility] will see patients 45 years of age and older. Monday - Tuesday (8am-5pm) Most Wednesdays (8:30-5pm) $30 per visit, cash only  [facility] Adult Dental [facility]  [address], [facility] [phone] Patients are seen by appointment only. Walk-ins are not accepted. [facility] will see patients 30 years of age and older. One Wednesday Evening (Monthly: Volunteer Based).  $30 per visit, cash only  [employer]  [phone] for adults; Children under age 72, call Graduate Pediatric Dentistry at [phone]. Children aged 28-14, please call [phone] to request a pediatric application.  Dental services are provided in all areas of dental care including fillings, crowns and bridges, complete and partial dentures, implants, gum treatment, root canals, and extractions. Preventive care is also provided. Treatment is provided to both adults and children. Patients are selected via a lottery and there is often a waiting list.   [facility] [address], [place]  [phone] [email or web address]   [facility] [address] [place], [place] [phone], Ext. [phone] Second and Fourth Thursday of each month, opens at 6:30 AM; Clinic ends at 9 AM.  Patients are seen on a first-come first-served basis, and a  limited number are seen during each clinic.   [facility]  [address] Ether Griffins [place], [place] [phone]   Eligibility Requirements You must have lived in [place], [place], or [place] counties for at least the last three months.   You cannot be eligible for state or federal sponsored [place], including [employer], [place], or [employer].   You generally cannot be eligible for healthcare insurance through your employer.    How to apply: Eligibility screenings are held every Tuesday and Wednesday afternoon from 1:00 pm until 4:00 pm. You do not need an appointment for the interview!  [facility] [address] [place], [place] [phone]   [facility]  [phone]   [facility]  [phone]   [facility]  [phone]    Behavioral Health Resources in the Community: Intensive Outpatient Programs Organization         Address  Phone  Notes  [facility] [address], [place], [place] [phone]   [facility] Outpatient [address], [place], [place] [phone]   [facility] [address], [place], [place]  [phone]   [facility] [address],  [place], [place] [phone] or [phone]   Substance Abuse Resources Organization         Address  Phone  Notes  [facility]  [phone]   [facility]  [phone]   [facility]  [phone]   Floydene Flock  [phone]   Residential & Outpatient Substance Abuse Program  [phone]   Psychological Services Organization         Address  Phone  Notes  [facility] Behavioral Health  [phone]   [facility] Services  [phone]   [facility] [address] [place] [phone] or [phone]  Mobile Crisis Teams Organization          Address  Phone  Notes  [facility], Mobile Crisis Care Unit  [phone]   [facility]  [address] [place], [place] [phone]   Doristine Locks [address] [place] [phone]    Self-Help/Support Groups Organization         Address  Phone             Notes  [facility] [place] - variety of support groups  336- I7437963 Call for more information  Narcotics Anonymous (NA), [facility] [address], [employer] [place]  2 meetings at this location   Statistician         Address  Phone  Notes  [facility] [address],    [place]  [phone]   [facility]  [address] [place] [address], [place], [place] [phone]   [facility] [address] [place], [place] [phone] Admissions: 8am-3pm M-F  [facility] [address],    [place], [place] [phone]   [facility] [address] [place], [place], [place] [phone]   [facility] [address],  [place], [place] [phone]   [facility] - Intensive Outpatient [address] Laurell Josephs [address], [place], [place] [phone]   [facility] ([facility] [address] [place]  [place], [place] [phone] or [phone]   [facility] ([facility]) [address] [place], [place] [phone] Accepts Medicaid  [facility] [address],  [place] [phone] Substance Abuse/Addiction Treatment   [place] Organization         Address  Phone  Notes  [facility]  [phone]   Angie Fava, PhD [address] Ervin Knack [place], [place]   [phone] or [phone]   [facility] Behavioral   [address], [place] [phone]   [facility] [address], [place], [place] [phone] Insurance/Medicaid/sponsorship  through [facility] and Families [address]                                    [place], [place] [phone] Therapy/tele-psych/case  [facility] [address], [place] [phone]    Dr. Lolly Mustache  [phone]   [facility] [place]  [employer] [facility] Dept. 1) [address], [place] 2) [address], [place] 3)  [address] [address], [place] [phone]  [phone]   [facility] Child Abuse Hotline 956-211-8688 or [phone] (After Hours)

## 2014-11-20 ENCOUNTER — Emergency Department (HOSPITAL_COMMUNITY)
Admission: EM | Admit: 2014-11-20 | Discharge: 2014-11-20 | Disposition: A | Discharge status: Home / Self Care | Payer: Self-pay | Attending: Emergency Medicine | Admitting: Emergency Medicine

## 2014-11-20 ENCOUNTER — Encounter (HOSPITAL_COMMUNITY): Payer: Self-pay | Admitting: *Deleted

## 2014-11-20 ENCOUNTER — Emergency Department (HOSPITAL_COMMUNITY): Payer: Self-pay

## 2014-11-20 DIAGNOSIS — I10 Essential (primary) hypertension: Secondary | ICD-10-CM | POA: Insufficient documentation

## 2014-11-20 DIAGNOSIS — R109 Unspecified abdominal pain: Secondary | ICD-10-CM

## 2014-11-20 DIAGNOSIS — Z792 Long term (current) use of antibiotics: Secondary | ICD-10-CM | POA: Insufficient documentation

## 2014-11-20 DIAGNOSIS — Z7952 Long term (current) use of systemic steroids: Secondary | ICD-10-CM | POA: Insufficient documentation

## 2014-11-20 DIAGNOSIS — E119 Type 2 diabetes mellitus without complications: Secondary | ICD-10-CM | POA: Insufficient documentation

## 2014-11-20 DIAGNOSIS — K439 Ventral hernia without obstruction or gangrene: Secondary | ICD-10-CM

## 2014-11-20 DIAGNOSIS — N390 Urinary tract infection, site not specified: Secondary | ICD-10-CM

## 2014-11-20 DIAGNOSIS — Z79899 Other long term (current) drug therapy: Secondary | ICD-10-CM | POA: Insufficient documentation

## 2014-11-20 DIAGNOSIS — R51 Headache: Secondary | ICD-10-CM | POA: Insufficient documentation

## 2014-11-20 DIAGNOSIS — R519 Headache, unspecified: Secondary | ICD-10-CM

## 2014-11-20 LAB — LIPASE, BLOOD: LIPASE: 22 U/L (ref 11–59)

## 2014-11-20 LAB — CBG MONITORING, ED: Glucose-Capillary: 94 mg/dL (ref 70–99)

## 2014-11-20 LAB — CBC WITH DIFFERENTIAL/PLATELET
Basophils Absolute: 0 10*3/uL (ref 0.0–0.1)
Basophils Relative: 0 % (ref 0–1)
EOS ABS: 0.3 10*3/uL (ref 0.0–0.7)
Eosinophils Relative: 4 % (ref 0–5)
HEMATOCRIT: 42.5 % (ref 36.0–46.0)
Hemoglobin: 14.2 g/dL (ref 12.0–15.0)
Lymphocytes Relative: 50 % — ABNORMAL HIGH (ref 12–46)
Lymphs Abs: 3.8 10*3/uL (ref 0.7–4.0)
MCH: 29.6 pg (ref 26.0–34.0)
MCHC: 33.4 g/dL (ref 30.0–36.0)
MCV: 88.5 fL (ref 78.0–100.0)
MONO ABS: 0.5 10*3/uL (ref 0.1–1.0)
Monocytes Relative: 6 % (ref 3–12)
NEUTROS ABS: 3.1 10*3/uL (ref 1.7–7.7)
NEUTROS PCT: 40 % — AB (ref 43–77)
PLATELETS: 210 10*3/uL (ref 150–400)
RBC: 4.8 MIL/uL (ref 3.87–5.11)
RDW: 13.3 % (ref 11.5–15.5)
WBC: 7.7 10*3/uL (ref 4.0–10.5)

## 2014-11-20 LAB — URINE MICROSCOPIC-ADD ON

## 2014-11-20 LAB — COMPREHENSIVE METABOLIC PANEL
ALT: 45 U/L — AB (ref 0–35)
ANION GAP: 6 (ref 5–15)
AST: 40 U/L — ABNORMAL HIGH (ref 0–37)
Albumin: 3.9 g/dL (ref 3.5–5.2)
Alkaline Phosphatase: 160 U/L — ABNORMAL HIGH (ref 39–117)
BILIRUBIN TOTAL: 0.4 mg/dL (ref 0.3–1.2)
BUN: 9 mg/dL (ref 6–23)
CHLORIDE: 102 mmol/L (ref 96–112)
CO2: 31 mmol/L (ref 19–32)
CREATININE: 0.66 mg/dL (ref 0.50–1.10)
Calcium: 9.1 mg/dL (ref 8.4–10.5)
GFR calc Af Amer: >90 mL/min (ref >90)
GFR calc non Af Amer: >90 mL/min (ref >90)
GLUCOSE: 94 mg/dL (ref 70–99)
Potassium: 3.7 mmol/L (ref 3.5–5.1)
Sodium: 139 mmol/L (ref 135–145)
Total Protein: 8.2 g/dL (ref 6.0–8.3)

## 2014-11-20 LAB — POC URINE PREG, ED: Preg Test, Ur: NEGATIVE

## 2014-11-20 LAB — URINALYSIS, ROUTINE W REFLEX MICROSCOPIC
Bilirubin Urine: NEGATIVE
Glucose, UA: NEGATIVE mg/dL
HGB URINE DIPSTICK: NEGATIVE
KETONES UR: NEGATIVE mg/dL
LEUKOCYTES UA: NEGATIVE
NITRITE: POSITIVE — AB
PH: 7.5 (ref 5.0–8.0)
Protein, ur: NEGATIVE mg/dL
SPECIFIC GRAVITY, URINE: 1.018 (ref 1.005–1.030)
Urobilinogen, UA: 0.2 mg/dL (ref 0.0–1.0)

## 2014-11-20 LAB — I-STAT CG4 LACTIC ACID, ED: Lactic Acid, Venous: 1.08 mmol/L (ref 0.5–2.0)

## 2014-11-20 MED ORDER — HYDROMORPHONE HCL 1 MG/ML IJ SOLN
1.0000 mg | Freq: Once | INTRAMUSCULAR | Status: AC
  Administered 2014-11-20: 1 mg via INTRAVENOUS
  Filled 2014-11-20: qty 1

## 2014-11-20 MED ORDER — METOCLOPRAMIDE HCL 5 MG/ML IJ SOLN
10.0000 mg | Freq: Once | INTRAMUSCULAR | Status: AC
  Administered 2014-11-20: 10 mg via INTRAVENOUS
  Filled 2014-11-20: qty 2

## 2014-11-20 MED ORDER — SODIUM CHLORIDE 0.9 % IV SOLN
Freq: Once | INTRAVENOUS | Status: AC
  Administered 2014-11-20: 17:00:00 via INTRAVENOUS

## 2014-11-20 MED ORDER — KETOROLAC TROMETHAMINE 30 MG/ML IJ SOLN
30.0000 mg | Freq: Once | INTRAMUSCULAR | Status: AC
  Administered 2014-11-20: 30 mg via INTRAVENOUS
  Filled 2014-11-20: qty 1

## 2014-11-20 MED ORDER — HYDROCODONE-ACETAMINOPHEN 5-325 MG PO TABS: 2.0000 | ORAL_TABLET | ORAL | Status: DC | PRN

## 2014-11-20 MED ORDER — IOHEXOL 300 MG/ML  SOLN
100.0000 mL | Freq: Once | INTRAMUSCULAR | Status: AC | PRN
  Administered 2014-11-20: 100 mL via INTRAVENOUS

## 2014-11-20 MED ORDER — CEPHALEXIN 500 MG PO CAPS: 500.0000 mg | ORAL_CAPSULE | Freq: Four times a day (QID) | ORAL | Status: DC

## 2014-11-20 MED ORDER — ONDANSETRON HCL 4 MG/2ML IJ SOLN
4.0000 mg | Freq: Once | INTRAMUSCULAR | Status: AC
  Administered 2014-11-20: 4 mg via INTRAVENOUS
  Filled 2014-11-20: qty 2

## 2014-11-20 MED ORDER — DIPHENHYDRAMINE HCL 50 MG/ML IJ SOLN
25.0000 mg | Freq: Once | INTRAMUSCULAR | Status: AC
  Administered 2014-11-20: 25 mg via INTRAVENOUS
  Filled 2014-11-20: qty 1

## 2014-11-20 MED ORDER — IOHEXOL 300 MG/ML  SOLN
25.0000 mL | Freq: Once | INTRAMUSCULAR | Status: AC | PRN
  Administered 2014-11-20: 25 mL via ORAL

## 2014-11-20 NOTE — ED Notes (Signed)
Notified MD of pt's pain in head, no new orders at this time

## 2014-11-20 NOTE — ED Provider Notes (Signed)
CSN: [id number]     Arrival date & time 11/20/14  1340 History   First MD Initiated Contact with Patient 11/20/14 1532     Chief Complaint  Patient presents with  . Back Pain  . Dysuria  . Headache     (Consider location/radiation/quality/duration/timing/severity/associated sxs/prior Treatment) HPI Comments: Patient presents to the ER for evaluation of abdominal and back pain. Patient is complaining of a sharp pain on the left side of her back and diffuse abdominal pain. She reports that she has an abdominal hernia. There is pain in that region specifically. She reports that symptoms have been present on and off for 8 years. She has not had any fever, nausea, vomiting, diarrhea. She has had pain with urination.  Patient is a 54 y.o. female presenting with back pain, dysuria, and headaches. The history is limited by a language barrier. A language interpreter was used.  Back Pain Associated symptoms: abdominal pain, dysuria and headaches   Dysuria Associated symptoms: abdominal pain   Headache Associated symptoms: abdominal pain and back pain     Past Medical History  Diagnosis Date  . Hypertension   . Diabetes mellitus without complication    Past Surgical History  Procedure Laterality Date  . Cesarean section    . Cholecystectomy     History reviewed. No pertinent family history. History  Substance Use Topics  . Smoking status: Never Smoker   . Smokeless tobacco: Not on file  . Alcohol Use: No   OB History    No data available     Review of Systems  Gastrointestinal: Positive for abdominal pain.  Genitourinary: Positive for dysuria.  Musculoskeletal: Positive for back pain.  Neurological: Positive for headaches.  All other systems reviewed and are negative.     Allergies  Review of patient's allergies indicates no known allergies.  Home Medications   Prior to Admission medications   Medication Sig Start Date End Date Taking? Authorizing Provider    acetaminophen (TYLENOL) 500 MG tablet Take 1,000 mg by mouth every 6 (six) hours as needed for mild pain.    Yes Historical Provider, MD  albuterol (PROVENTIL HFA;VENTOLIN HFA) 108 (90 BASE) MCG/ACT inhaler Inhale 1-2 puffs into the lungs every 6 (six) hours as needed for wheezing or shortness of breath. Patient not taking: Reported on 09/18/2014 06/27/14   Reuben Likesavid C Keller, MD  amoxicillin (AMOXIL) 500 MG capsule Take 1 capsule (500 mg total) by mouth 3 (three) times daily. Patient not taking: Reported on 11/20/2014 09/18/14   Elson AreasLeslie K Sofia, PA-C  guaiFENesin-codeine [facility](ROBITUSSIN AC) 100-10 MG/5ML syrup Take 5 mLs by mouth 3 (three) times daily as needed for cough. Patient not taking: Reported on 09/18/2014 06/27/14   Reuben Likesavid C Keller, MD  HYDROcodone-acetaminophen (NORCO/VICODIN) 5-325 MG per tablet Take 2 tablets by mouth every 4 (four) hours as needed for moderate pain. Patient not taking: Reported on 09/18/2014 05/24/14   Gilda Creasehristopher J. Shonita Rinck, MD  ipratropium (ATROVENT) 0.06 % nasal spray Place 2 sprays into both nostrils 4 (four) times daily. Patient not taking: Reported on 09/18/2014 06/27/14   Reuben Likesavid C Keller, MD  ondansetron (ZOFRAN ODT) 4 MG disintegrating tablet Take 1 tablet (4 mg total) by mouth every 8 (eight) hours as needed for nausea or vomiting. Patient not taking: Reported on 11/20/2014 09/18/14   Elson AreasLeslie K Sofia, PA-C  predniSONE (DELTASONE) 20 MG tablet Take 1 tablet (20 mg total) by mouth 2 (two) times daily. Patient not taking: Reported on 09/18/2014 06/27/14   Onalee Huaavid  Vivia Budge, MD   BP 128/79 mmHg  Pulse 75  Temp(Src) 98 F (36.7 C) (Oral)  Resp 16  SpO2 98% Physical Exam  Constitutional: She is oriented to person, place, and time. She appears well-developed and well-nourished. No distress.  HENT:  Head: Normocephalic and atraumatic.  Right Ear: Hearing normal.  Left Ear: Hearing normal.  Nose: Nose normal.  Mouth/Throat: Oropharynx is clear and moist and mucous membranes are normal.   Eyes: Conjunctivae and EOM are normal. Pupils are equal, round, and reactive to light.  Neck: Normal range of motion. Neck supple.  Cardiovascular: Regular rhythm, S1 normal and S2 normal.  Exam reveals no gallop and no friction rub.   No murmur heard. Pulmonary/Chest: Effort normal and breath sounds normal. No respiratory distress. She exhibits no tenderness.  Abdominal: Soft. Normal appearance and bowel sounds are normal. There is no hepatosplenomegaly. There is tenderness. There is no rebound, no guarding, no tenderness at McBurney's point and negative Murphy's sign. No hernia.  Musculoskeletal: Normal range of motion.       Lumbar back: She exhibits tenderness.       Back:  Neurological: She is alert and oriented to person, place, and time. She has normal strength. No cranial nerve deficit or sensory deficit. Coordination normal. GCS eye subscore is 4. GCS verbal subscore is 5. GCS motor subscore is 6.  Skin: Skin is warm, dry and intact. No rash noted. No cyanosis.  Psychiatric: She has a normal mood and affect. Her speech is normal and behavior is normal. Thought content normal.  Nursing note and vitals reviewed.   ED Course  Procedures (including critical care time) Labs Review Labs Reviewed  URINALYSIS, ROUTINE W REFLEX MICROSCOPIC - Abnormal; Notable for the following:    APPearance CLOUDY (*)    Nitrite POSITIVE (*)    All other components within normal limits  CBC WITH DIFFERENTIAL/PLATELET - Abnormal; Notable for the following:    Neutrophils Relative % 40 (*)    Lymphocytes Relative 50 (*)    All other components within normal limits  COMPREHENSIVE METABOLIC PANEL - Abnormal; Notable for the following:    AST 40 (*)    ALT 45 (*)    Alkaline Phosphatase 160 (*)    All other components within normal limits  URINE MICROSCOPIC-ADD ON - Abnormal; Notable for the following:    Bacteria, UA MANY (*)    All other components within normal limits  LIPASE, BLOOD  POC URINE  PREG, ED  CBG MONITORING, ED  I-STAT CG4 LACTIC ACID, ED    Imaging Review Ct Abdomen Pelvis W Contrast  11/20/2014   CLINICAL DATA:  Right-sided abdominal pain with micturition for 4 months. Diabetes. Hypertension.  EXAM: CT ABDOMEN AND PELVIS WITH CONTRAST  TECHNIQUE: Multidetector CT imaging of the abdomen and pelvis was performed using the standard protocol following bolus administration of intravenous contrast.  CONTRAST:  25mL OMNIPAQUE IOHEXOL 300 MG/ML SOLN, OMNIPAQUE IOHEXOL 300 MG/ML SOLN  COMPARISON:  None.  FINDINGS: Body habitus reduces diagnostic sensitivity and specificity.  Lower chest:  Mild airway thickening in both lower lobes.  Hepatobiliary: Diffuse hepatic steatosis.  Cholecystectomy.  Pancreas: Unremarkable  Spleen: Unremarkable  Adrenals/Urinary Tract: Right kidney lower pole 2 mm nonobstructive calculus. Anterior left mid kidney hypodense 6 mm lesion, probably a benign cyst but technically nonspecific.  Stomach/Bowel: Unremarkable  Vascular/Lymphatic: Unremarkable  Reproductive: Unremarkable  Other: No supplemental non-categorized findings.  Musculoskeletal: Umbilical hernia contains adipose tissue. Infraumbilical hernia with hernia neck  measuring 2.2 by 2.3 cm and herniated tissue measuring 8.8 by 6.6 by 10.0 cm containing omentum demonstrates low grade stranding in the herniated omental adipose tissue.  Mild degenerative facet arthropathy on the right at L4-5 and L5-S1.  IMPRESSION: 1. Infraumbilical midline hernia containing omental fat with mild internal stranding. There is also an umbilical hernia containing omental adipose tissue. 2. Mild airway thickening in both lower lobes -bronchitis not excluded. 3. Diffuse hepatic steatosis. 4. Nonobstructive right nephrolithiasis.   Electronically Signed   By: Gaylyn Rong M.D.   On: 11/20/2014 18:35     EKG Interpretation None      MDM   Final diagnoses:  Abdominal pain  UTI (lower urinary tract infection)    Headache, unspecified headache type    Patient presents to the ER for evaluation of multiple problems. Patient reports that she has been having abdominal pain for 8 years. She does have a known hernia. Patient has not had any nausea, vomiting or diarrhea. She reports diffuse abdominal pain. She also has had pain with urination which is in the bladder and suprapubic region. Urinalysis does suggest infection. Lab work was otherwise unremarkable. CT scan was performed to further evaluate. No acute abnormality is noted.  So complaining of headache. Headache is global. Onset has been slow and unremarkable. She has normal neurologic exam. She does not have a fever. No meningismus. Patient has had resolution with Toradol Reglan and Benadryl.  Patient to be discharged, follow-up with general surgery for multiple abdominal hernias seen on CT scan. There were no Acute features seen on CT. She'll be treated with Keflex for urinary tract infection. Analgesia. Return if symptoms worsen.   Gilda Crease, MD 11/20/14 2135

## 2014-11-20 NOTE — ED Notes (Signed)
Pt reports right side back pain and pain with urination x 4 months. Also has headache and nausea.

## 2014-11-20 NOTE — Discharge Instructions (Signed)
Dolor abdominal (Abdominal Pain) El dolor puede tener muchas causas. Normalmente la causa del dolor abdominal no es una enfermedad y Scientist, clinical (histocompatibility and immunogenetics) sin TEFL teacher. Frecuentemente puede controlarse y tratarse en casa. Su mdico le Medical sales representative examen fsico y posiblemente solicite anlisis de sangre y radiografas para ayudar a Chief Strategy Officer la gravedad de su dolor. Sin embargo, en [employer], debe transcurrir ms tiempo antes de que se pueda Clinical research associate una causa evidente del dolor. Antes de llegar a ese punto, es posible que su mdico no sepa si necesita ms pruebas o un tratamiento ms profundo. INSTRUCCIONES PARA EL CUIDADO EN EL HOGAR  Est atento al dolor para ver si hay cambios. Las siguientes indicaciones ayudarn a Architectural technologist que pueda sentir:  [place] solo medicamentos de venta libre o recetados, segn las indicaciones del mdico.  No tome laxantes a menos que se lo haya indicado su mdico.  Pruebe con [place] Dear dieta lquida absoluta (caldo, t o agua) segn se lo indique su mdico. Introduzca gradualmente una dieta normal, segn su tolerancia. SOLICITE ATENCIN MDICA SI:  Tiene dolor abdominal sin explicacin.  Tiene dolor abdominal relacionado con nuseas o diarrea.  Tiene dolor cuando orina o defeca.  Experimenta dolor abdominal que lo despierta de noche.  Tiene dolor abdominal que empeora o mejora cuando come alimentos.  Tiene dolor abdominal que empeora cuando come alimentos grasosos.  Tiene fiebre. SOLICITE ATENCIN MDICA DE INMEDIATO SI:   El dolor no desaparece en un plazo mximo de 2horas.  No deja de (vomitar).  El Engineer, mining se siente solo en partes del abdomen, como el lado derecho o la parte inferior izquierda del abdomen.  Evaca materia fecal sanguinolenta o negra, de aspecto alquitranado. ASEGRESE DE QUE:  Comprende estas instrucciones.  Controlar su afeccin.  Recibir ayuda de inmediato si no mejora o si empeora. Document Released: 09/02/2005  Document Revised: 09/07/2013 [facility] Patient Information 2015 [place], [place]. This information is not intended to replace advice given to you by your health care provider. Make sure you discuss any questions you have with your health care provider.  Dolor de cabeza general sin causa  (General Headache Without Cause)   EL dolor de cabeza es un dolor o malestar que se siente en la zona de la cabeza o del cuello. Puede no tener una causa especfica. Hay muchas causas y tipos de dolores de [place]. Los ms comunes son:   [place]efalea tensional.  Cefaleas migraosas.  Cefalea en brotes.  Cefaleas diarias crnicas. INSTRUCCIONES PARA EL CUIDADO EN EL HOGAR   [place] con todas las citas programadas con su mdico o con el especialista al que lo hayan derivado.  Slo tome medicamentos de venta libre o recetados para Primary school teacher o Environmental health practitioner, segn las indicaciones de su mdico.  Cuando sienta dolor de cabeza acustese en un cuarto oscuro y tranquilo.  Lleve un registro diario para Financial risk analyst lo que Press photographer. Por ejemplo, escriba:  Lo que come y bebe.  Cunto tiempo duerme.  Todo cambio en la dieta o medicamentos.  Intente con masajes u otras tcnicas de relajacin.  Colquese compresas de hielo o calor en la cabeza y en el cuello. selos 3 a 4 veces por da de 15 a 20 minutos por vez, o como sea necesario.  Limite las situaciones de estrs.  Sintese con la espalda recta y no  tense los msculos.  Si fuma, deje de hacerlo.  Limite el consumo de bebidas alcohlicas.  Consuma menos cantidad de cafena o deje de tomarla.  Coma y duerma en horarios regulares.  Duerma entre 7 y 9 horas o como lo indique su mdico.  Dietitian las luces tenues si le molestan las luces brillantes y Hospital doctor dolor de [place]. SOLICITE ATENCIN MDICA SI:   Tiene problemas con los Arboriculturist.  Los medicamentos no Education officer, environmental.  El dolor de cabeza que senta  habitualmente es diferente.  Tiene nuseas o vmitos. SOLICITE ATENCIN MDICA DE INMEDIATO SI:   El dolor se hace cada vez ms intenso.  Tiene fiebre.  Presenta rigidez en el cuello.  Sufre prdida de la visin.  Presenta debilidad muscular o prdida del control muscular.  Comienza a perder el equilibrio o tiene problemas para caminar.  Sufre mareos o se desmaya.  Tiene sntomas graves que son diferentes a los primeros sntomas. ASEGRESE DE QUE:   Comprende estas instrucciones.  Controlar su enfermedad.  Solicitar ayuda de inmediato si no mejora o empeora. Document Released: 06/12/2005 Document Revised: 03/03/2012 [facility] Patient Information 2015 [place], [place]. This information is not intended to replace advice given to you by your health care provider. Make sure you discuss any questions you have with your health care provider.  Infeccin urinaria  (Urinary Tract Infection)  La infeccin urinaria puede ocurrir en Corporate treasurer del tracto urinario. El tracto urinario es un sistema de drenaje del cuerpo por el que se eliminan los desechos y el exceso de [place]. El tracto urinario est formado por dos riones, dos urteres, la vejiga y Engineer, mining. Los riones son rganos que tienen forma de frijol. Cada rin tiene aproximadamente el tamao del puo. Estn situados debajo de las [place], uno a cada lado de la columna vertebral CAUSAS  La causa de la infeccin son los microbios, que son organismos microscpicos, que incluyen hongos, virus, y bacterias. Estos organismos son tan pequeos que slo pueden verse a travs del microscopio. Las bacterias son los microorganismos que ms comnmente causan infecciones urinarias.  SNTOMAS  Los sntomas pueden variar segn la edad y el sexo del paciente y por la ubicacin de la infeccin. Los sntomas en las mujeres jvenes incluyen la necesidad frecuente e intensa de orinar y una sensacin dolorosa de ardor en la vejiga o en la uretra  durante la miccin. Las mujeres y los hombres mayores podrn sentir cansancio, temblores y debilidad y Futures trader musculares y Engineer, mining abdominal. Si tiene [place], puede significar que la infeccin est en los riones. Otros sntomas son dolor en la espalda o en los lados debajo de las [place], nuseas y vmitos.  DIAGNSTICO  Para diagnosticar una infeccin urinaria, el mdico le preguntar acerca de sus sntomas. [employer] una [place]. La muestra de orina se analiza para Engineer, manufacturing bacterias y glbulos blancos de Risk manager. Los glbulos blancos se forman en el organismo para ayudar a Artist las infecciones.  TRATAMIENTO  Por lo general, las infecciones urinarias pueden tratarse con medicamentos. Debido a que la [employer] de las infecciones son causadas por bacterias, por lo general pueden tratarse con antibiticos. La eleccin del antibitico y la duracin del tratamiento depender de sus sntomas y el tipo de bacteria causante de la infeccin.  INSTRUCCIONES PARA EL CUIDADO EN EL HOGAR   Si le recetaron antibiticos, tmelos exactamente como su mdico le indique. Termine el medicamento aunque se sienta mejor despus de haber tomado slo algunos.  Beba gran cantidad de lquido para mantener la orina de tono claro o color amarillo plido.  Evite la cafena, el t  y las bebidas gaseosas. Estas sustancias irritan la vejiga.  Vaciar la vejiga con frecuencia. Evite retener la orina durante largos perodos.  Vace la vejiga antes y despus de Management consultanttener relaciones sexuales.  Despus de mover el intestino, las mujeres deben higienizarse la regin perineal desde adelante hacia atrs. Use slo un papel tissue por vez. SOLICITE ATENCIN MDICA SI:   Siente dolor en la espalda.  Le sube la fiebre.  Los sntomas no mejoran luego de [address]. SOLICITE ATENCIN MDICA DE INMEDIATO SI:   Siente dolor intenso en la espalda o en la zona inferior del abdomen.  Comienza a sentir  escalofros.  Tiene nuseas o vmitos.  Tiene una sensacin continua de quemazn o molestias al ConocoPhillipsorinar. ASEGRESE DE QUE:   Comprende estas instrucciones.  Controlar su enfermedad.  Solicitar ayuda de inmediato si no mejora o empeora. Document Released: 06/12/2005 Document Revised: 05/27/2012 [facility] Patient Information 2015 [place]. This information is not intended to replace advice given to you by your health care provider. Make sure you discuss any questions you have with your health care provider.

## 2015-07-25 ENCOUNTER — Ambulatory Visit: Payer: Self-pay

## 2016-08-21 ENCOUNTER — Emergency Department (HOSPITAL_COMMUNITY): Payer: Self-pay

## 2016-08-21 ENCOUNTER — Encounter (HOSPITAL_COMMUNITY): Payer: Self-pay | Admitting: Emergency Medicine

## 2016-08-21 ENCOUNTER — Emergency Department (HOSPITAL_COMMUNITY)
Admission: EM | Admit: 2016-08-21 | Discharge: 2016-08-21 | Disposition: A | Discharge status: Home / Self Care | Payer: Self-pay | Attending: Emergency Medicine | Admitting: Emergency Medicine

## 2016-08-21 DIAGNOSIS — E1165 Type 2 diabetes mellitus with hyperglycemia: Secondary | ICD-10-CM | POA: Insufficient documentation

## 2016-08-21 DIAGNOSIS — R791 Abnormal coagulation profile: Secondary | ICD-10-CM | POA: Insufficient documentation

## 2016-08-21 DIAGNOSIS — Z87891 Personal history of nicotine dependence: Secondary | ICD-10-CM | POA: Insufficient documentation

## 2016-08-21 DIAGNOSIS — R739 Hyperglycemia, unspecified: Secondary | ICD-10-CM

## 2016-08-21 DIAGNOSIS — G510 Bell's palsy: Secondary | ICD-10-CM | POA: Insufficient documentation

## 2016-08-21 DIAGNOSIS — I10 Essential (primary) hypertension: Secondary | ICD-10-CM | POA: Insufficient documentation

## 2016-08-21 LAB — CBC
HEMATOCRIT: 43.3 % (ref 36.0–46.0)
HEMOGLOBIN: 15.7 g/dL — AB (ref 12.0–15.0)
MCH: 30.5 pg (ref 26.0–34.0)
MCHC: 36.3 g/dL — AB (ref 30.0–36.0)
MCV: 84.1 fL (ref 78.0–100.0)
Platelets: 214 10*3/uL (ref 150–400)
RBC: 5.15 MIL/uL — AB (ref 3.87–5.11)
RDW: 12.6 % (ref 11.5–15.5)
WBC: 8.1 10*3/uL (ref 4.0–10.5)

## 2016-08-21 LAB — DIFFERENTIAL
Basophils Absolute: 0 10*3/uL (ref 0.0–0.1)
Basophils Relative: 0 %
EOS PCT: 5 %
Eosinophils Absolute: 0.4 10*3/uL (ref 0.0–0.7)
LYMPHS ABS: 4.1 10*3/uL — AB (ref 0.7–4.0)
LYMPHS PCT: 50 %
MONO ABS: 0.4 10*3/uL (ref 0.1–1.0)
MONOS PCT: 5 %
NEUTROS PCT: 40 %
Neutro Abs: 3.2 10*3/uL (ref 1.7–7.7)

## 2016-08-21 LAB — COMPREHENSIVE METABOLIC PANEL
ALBUMIN: 3.7 g/dL (ref 3.5–5.0)
ALK PHOS: 247 U/L — AB (ref 38–126)
ALT: 14 U/L (ref 14–54)
ANION GAP: 11 (ref 5–15)
AST: 20 U/L (ref 15–41)
BILIRUBIN TOTAL: 0.9 mg/dL (ref 0.3–1.2)
BUN: 9 mg/dL (ref 6–20)
CALCIUM: 9.5 mg/dL (ref 8.9–10.3)
CO2: 24 mmol/L (ref 22–32)
Chloride: 96 mmol/L — ABNORMAL LOW (ref 101–111)
Creatinine, Ser: 0.33 mg/dL — ABNORMAL LOW (ref 0.44–1.00)
GFR calc Af Amer: >60 mL/min (ref >60)
GLUCOSE: 340 mg/dL — AB (ref 65–99)
Potassium: 3.9 mmol/L (ref 3.5–5.1)
Sodium: 131 mmol/L — ABNORMAL LOW (ref 135–145)
TOTAL PROTEIN: 8.1 g/dL (ref 6.5–8.1)

## 2016-08-21 LAB — APTT: aPTT: 29 seconds (ref 24–36)

## 2016-08-21 LAB — I-STAT CHEM 8, ED
BUN: 10 mg/dL (ref 6–20)
CALCIUM ION: 1.17 mmol/L (ref 1.15–1.40)
CREATININE: 0.4 mg/dL — AB (ref 0.44–1.00)
Chloride: 100 mmol/L — ABNORMAL LOW (ref 101–111)
Glucose, Bld: 357 mg/dL — ABNORMAL HIGH (ref 65–99)
HCT: 46 % (ref 36.0–46.0)
Hemoglobin: 15.6 g/dL — ABNORMAL HIGH (ref 12.0–15.0)
Potassium: 3.7 mmol/L (ref 3.5–5.1)
SODIUM: 136 mmol/L (ref 135–145)
TCO2: 28 mmol/L (ref 0–100)

## 2016-08-21 LAB — I-STAT TROPONIN, ED: TROPONIN I, POC: 0 ng/mL (ref 0.00–0.08)

## 2016-08-21 LAB — PROTIME-INR
INR: 0.86
Prothrombin Time: 11.6 seconds (ref 11.4–15.2)

## 2016-08-21 MED ORDER — PREDNISONE 20 MG PO TABS: 40.0000 mg | ORAL_TABLET | Freq: Every day | ORAL | 0 refills | Status: DC

## 2016-08-21 MED ORDER — IBUPROFEN 600 MG PO TABS: 600.0000 mg | ORAL_TABLET | Freq: Four times a day (QID) | ORAL | 0 refills | Status: DC | PRN

## 2016-08-21 MED ORDER — KETOROLAC TROMETHAMINE 15 MG/ML IJ SOLN
30.0000 mg | Freq: Once | INTRAMUSCULAR | Status: AC
  Administered 2016-08-21: 30 mg via INTRAVENOUS
  Filled 2016-08-21: qty 2

## 2016-08-21 MED ORDER — VALACYCLOVIR HCL 1 G PO TABS: 1000.0000 mg | ORAL_TABLET | Freq: Three times a day (TID) | ORAL | 0 refills | Status: DC

## 2016-08-21 MED ORDER — METFORMIN HCL 500 MG PO TABS: 500.0000 mg | ORAL_TABLET | Freq: Two times a day (BID) | ORAL | 0 refills | Status: DC

## 2016-08-21 NOTE — ED Triage Notes (Signed)
Pt presents to ED for assessment of right-sided facial droop and left sided weakness noted Monday evening and not resolving.  Pt able to stand, but experiencing weakness on the left side.

## 2016-08-21 NOTE — ED Notes (Signed)
ED Provider at bedside. 

## 2016-08-21 NOTE — ED Notes (Signed)
Patient transported to MRI 

## 2016-08-21 NOTE — ED Notes (Signed)
Pt returned from MRI and connected to the monitor 

## 2016-08-21 NOTE — Discharge Instructions (Signed)
Su MRI no mostr un accidente cerebrovascular hoy. Creemos que sus sntomas se deben a la parlisis de Bell. Tome valacyclovir segn lo prescrito y prednisona hasta que termine. Compre lgrimas artificiales en su farmacia local. selos al menos una vez por hora mientras est despierto para evitar que se seque el ojo derecho. Puede tomar ibuprofeno para Chief Technology Officerel dolor. Haga un seguimiento con un mdico de atencin primaria con respecto a su visita de hoy. Tambin deberan volver a verificar su nivel de azcar. Es posible que deba reiniciar la medicacin para la diabetes. Regresar por sntomas nuevos o preocupantes.  Your MRI did not show a stroke today. We believe that your symptoms are due to Bell's palsy. Take valacyclovir as prescribed and prednisone until finished. Buy artificial tears at your local pharmacy. Use these at least once per hour while awake to prevent drying of your right eye. You may take ibuprofen for pain. Follow-up with a primary care doctor regarding your visit today. They should also recheck your sugar level. You may need to restart diabetes medication. Return for new or concerning symptoms.

## 2016-08-21 NOTE — ED Provider Notes (Signed)
[facility]EMER[facility]Y DEPT Provider Note   CSN: [id number] Arrival date & time: 08/21/16  0224    History   Chief Complaint Chief Complaint  Patient presents with  . Facial Droop    HPI Denise Mosley is a 55 y.o. female.  55 year old female with a history of diabetes mellitus and hypertension presents to the emergency department for evaluation of right-sided facial drooping. Symptoms were noted by the patient when she awoke from sleep yesterday morning. She tried massaging her face and felt as though this improved her symptoms slightly. She has had difficulty drinking as a result of drooping to the right side of her mouth. Patient also reports subjective paresthesias in her left upper and left lower extremities. She further reports some discomfort behind her right ear. She has had some nasal congestion and a cough consistent with a viral illness. No recent fevers or vomiting. No extremity weakness. Son does report a history of stroke. Patient reports taking a baby aspirin daily. No history of head injury.   The history is provided by the patient and a relative. No language interpreter was used.    Past Medical History:  Diagnosis Date  . Diabetes mellitus without complication (HCC)   . Hypertension     There are no active problems to display for this patient.   Past Surgical History:  Procedure Laterality Date  . CESAREAN SECTION    . CHOLECYSTECTOMY      OB History    No data available       Home Medications    Prior to Admission medications   Medication Sig Start Date End Date Taking? Authorizing Provider  acetaminophen (TYLENOL) 500 MG tablet Take 1,000 mg by mouth every 6 (six) hours as needed for mild pain.     Historical Provider, MD  cephALEXin (KEFLEX) 500 MG capsule Take 1 capsule (500 mg total) by mouth 4 (four) times daily. 11/20/14   Gilda Creasehristopher J Pollina, MD  HYDROcodone-acetaminophen (NORCO/VICODIN) 5-325 MG per tablet Take 2 tablets by mouth every 4 (four)  hours as needed for moderate pain. 11/20/14   Gilda Creasehristopher J Pollina, MD  ibuprofen (ADVIL,MOTRIN) 600 MG tablet Take 1 tablet (600 mg total) by mouth every 6 (six) hours as needed for mild pain or moderate pain. 08/21/16   Antony MaduraKelly Lalita Ebel, PA-C  predniSONE (DELTASONE) 20 MG tablet Take 2 tablets (40 mg total) by mouth daily. Take 40 mg by mouth daily for 3 days, then 20mg  by mouth daily for 3 days, then 10mg  daily for 3 days 08/21/16   Antony MaduraKelly Osker Ayoub, PA-C  valACYclovir (VALTREX) 1000 MG tablet Take 1 tablet (1,000 mg total) by mouth 3 (three) times daily. 08/21/16   Antony MaduraKelly Dallys Nowakowski, PA-C    Family History History reviewed. No pertinent family history.  Social History Social History  Substance Use Topics  . Smoking status: Former Games developermoker  . Smokeless tobacco: Never Used     Comment: Quit 4 years ago  . Alcohol use No     Allergies   Patient has no known allergies.   Review of Systems Review of Systems Ten systems reviewed and are negative for acute change, except as noted in the HPI.    Physical Exam Updated Vital Signs BP 112/72 (BP Location: Right Arm)   Pulse 71   Temp 97.7 F (36.5 C)   Resp 11   SpO2 95%   Physical Exam  Constitutional: She is oriented to person, place, and time. She appears well-developed and well-nourished. No distress.  Nontoxic and  in NAD  HENT:  Head: Normocephalic and atraumatic.  Right Ear: External ear and ear canal normal. Tympanic membrane is not injected, not perforated, not erythematous, not retracted and not bulging. A middle ear effusion (mild) is present.  Left Ear: Tympanic membrane, external ear and ear canal normal.  Mouth/Throat: Oropharynx is clear and moist.  Flattening of nasolabial fold.  Eyes: Conjunctivae and EOM are normal. Pupils are equal, round, and reactive to light. No scleral icterus.  Neck: Normal range of motion.  No nuchal rigidity or meningismus  Cardiovascular: Normal rate, regular rhythm and intact distal pulses.     Pulmonary/Chest: Effort normal. No respiratory distress. She has no wheezes. She has no rales.  Respirations even and unlabored  Musculoskeletal: Normal range of motion.  Neurological: She is alert and oriented to person, place, and time. She exhibits normal muscle tone. Coordination normal.  Patient with RIGHT sided facial paralysis; no right eyebrow movement, weakness of the right eyelid, and down turning of right corner of the mouth. Equal grip strength bilaterally with 5/5 strength in all major muscle groups bilaterally. No pronator drift. Subjective paresthesias in the LEFT upper and lower extremities.  Skin: Skin is warm and dry. No rash noted. She is not diaphoretic. No erythema. No pallor.  Psychiatric: She has a normal mood and affect. Her behavior is normal.  Nursing note and vitals reviewed.    ED Treatments / Results  Labs (all labs ordered are listed, but only abnormal results are displayed) Labs Reviewed  CBC - Abnormal; Notable for the following:       Result Value   RBC 5.15 (*)    Hemoglobin 15.7 (*)    MCHC 36.3 (*)    All other components within normal limits  DIFFERENTIAL - Abnormal; Notable for the following:    Lymphs Abs 4.1 (*)    All other components within normal limits  COMPREHENSIVE METABOLIC PANEL - Abnormal; Notable for the following:    Sodium 131 (*)    Chloride 96 (*)    Glucose, Bld 340 (*)    Creatinine, Ser 0.33 (*)    Alkaline Phosphatase 247 (*)    All other components within normal limits  I-STAT CHEM 8, ED - Abnormal; Notable for the following:    Chloride 100 (*)    Creatinine, Ser 0.40 (*)    Glucose, Bld 357 (*)    Hemoglobin 15.6 (*)    All other components within normal limits  PROTIME-INR  APTT  I-STAT TROPOININ, ED    EKG  EKG Interpretation  Date/Time:  Wednesday August 21 2016 02:40:35 EST Ventricular Rate:  72 PR Interval:  152 QRS Duration: 88 QT Interval:  388 QTC Calculation: 424 R Axis:   -41 Text  Interpretation:  Normal sinus rhythm Left axis deviation Abnormal ECG When compared with ECG of 04/12/2014, No significant change was found Confirmed by [facility]  MD, DAVID ([id number]) on 08/21/2016 2:45:22 AM       Radiology Mr Brain Wo Contrast  Result Date: 08/21/2016 CLINICAL DATA:  RIGHT facial droop, LEFT-sided weakness beginning 2 days ago. History of hypertension and diabetes. EXAM: MRI HEAD WITHOUT CONTRAST TECHNIQUE: Multiplanar, multiecho pulse sequences of the brain and surrounding structures were obtained without intravenous contrast. COMPARISON:  None. FINDINGS: BRAIN: No reduced diffusion to suggest acute ischemia. No susceptibility artifact to suggest hemorrhage. The ventricles and sulci are normal for patient's age. No suspicious parenchymal signal, masses or mass effect. No abnormal extra-axial fluid collections. No extra-axial masses  though, contrast enhanced sequences would be more sensitive. VASCULAR: Normal major intracranial vascular flow voids present at skull base. SKULL AND UPPER CERVICAL SPINE: No abnormal sellar expansion. No suspicious calvarial bone marrow signal. Craniocervical junction maintained. SINUSES/ORBITS: Mild paranasal sinus mucosal thickening, lobulated maxillary sinus mucosal thickening. Trace mastoid effusions. The included ocular globes and orbital contents are non-suspicious. OTHER: None. IMPRESSION: Normal noncontrast MRI head. Electronically Signed   By: Awilda Metroourtnay  Bloomer M.D.   On: 08/21/2016 05:25    Procedures Procedures (including critical care time)  Medications Ordered in ED Medications  ketorolac (TORADOL) 15 MG/ML injection 30 mg (30 mg Intravenous Given 08/21/16 0540)     Initial Impression / Assessment and Plan / ED Course  I have reviewed the triage vital signs and the nursing notes.  Pertinent labs & imaging results that were available during my care of the patient were reviewed by me and considered in my medical decision making (see chart for  details).  Clinical Course     Patient with clinical signs and symptoms of Bell's palsy. MRI negative for stroke. Neurologic exam reassuring, aside from findings c/w peripheral cranial nerve paralysis. Patient with stable vital signs and reassuring laboratory workup. She does have hyperglycemia and was previously on medication for diabetes, but discontinued this due to side effects. This may be contributing to her Bell's palsy today. Onset may also be related to viral URI. Plan to start on metformin. Prednisone taper and Valacyclovir initiated given onset <72 hours ago. PCP follow up advised and return precautions given. Patient discharged in stable condition with no unaddressed concerns.   Final Clinical Impressions(s) / ED Diagnoses   Final diagnoses:  Bell's palsy  Hyperglycemia    New Prescriptions New Prescriptions   IBUPROFEN (ADVIL,MOTRIN) 600 MG TABLET    Take 1 tablet (600 mg total) by mouth every 6 (six) hours as needed for mild pain or moderate pain.   PREDNISONE (DELTASONE) 20 MG TABLET    Take 2 tablets (40 mg total) by mouth daily. Take 40 mg by mouth daily for 3 days, then 20mg  by mouth daily for 3 days, then 10mg  daily for 3 days   VALACYCLOVIR (VALTREX) 1000 MG TABLET    Take 1 tablet (1,000 mg total) by mouth 3 (three) times daily.     Antony MaduraKelly Natassja Ollis, PA-C 08/21/16 [id number]    Dione Boozeavid Glick, MD 08/21/16 [phone]

## 2017-01-14 ENCOUNTER — Emergency Department (HOSPITAL_COMMUNITY): Payer: Medicaid Other

## 2017-01-14 ENCOUNTER — Inpatient Hospital Stay (HOSPITAL_COMMUNITY)
Admission: EM | Admit: 2017-01-14 | Discharge: 2017-01-23 | DRG: 871 | Disposition: A | Discharge status: Home / Self Care | Payer: Medicaid Other | Attending: Family Medicine | Admitting: Family Medicine

## 2017-01-14 ENCOUNTER — Encounter (HOSPITAL_COMMUNITY): Payer: Self-pay | Admitting: Emergency Medicine

## 2017-01-14 DIAGNOSIS — A419 Sepsis, unspecified organism: Secondary | ICD-10-CM | POA: Diagnosis present

## 2017-01-14 DIAGNOSIS — I110 Hypertensive heart disease with heart failure: Secondary | ICD-10-CM | POA: Diagnosis not present

## 2017-01-14 DIAGNOSIS — D649 Anemia, unspecified: Secondary | ICD-10-CM | POA: Diagnosis not present

## 2017-01-14 DIAGNOSIS — E118 Type 2 diabetes mellitus with unspecified complications: Secondary | ICD-10-CM

## 2017-01-14 DIAGNOSIS — E871 Hypo-osmolality and hyponatremia: Secondary | ICD-10-CM | POA: Diagnosis present

## 2017-01-14 DIAGNOSIS — R630 Anorexia: Secondary | ICD-10-CM

## 2017-01-14 DIAGNOSIS — R0902 Hypoxemia: Secondary | ICD-10-CM

## 2017-01-14 DIAGNOSIS — E876 Hypokalemia: Secondary | ICD-10-CM | POA: Diagnosis not present

## 2017-01-14 DIAGNOSIS — M545 Low back pain: Secondary | ICD-10-CM | POA: Diagnosis present

## 2017-01-14 DIAGNOSIS — R739 Hyperglycemia, unspecified: Secondary | ICD-10-CM

## 2017-01-14 DIAGNOSIS — J9601 Acute respiratory failure with hypoxia: Secondary | ICD-10-CM

## 2017-01-14 DIAGNOSIS — E1165 Type 2 diabetes mellitus with hyperglycemia: Secondary | ICD-10-CM | POA: Diagnosis present

## 2017-01-14 DIAGNOSIS — R8271 Bacteriuria: Secondary | ICD-10-CM | POA: Diagnosis present

## 2017-01-14 DIAGNOSIS — M549 Dorsalgia, unspecified: Secondary | ICD-10-CM

## 2017-01-14 DIAGNOSIS — E860 Dehydration: Secondary | ICD-10-CM | POA: Diagnosis present

## 2017-01-14 DIAGNOSIS — Z6834 Body mass index (BMI) 34.0-34.9, adult: Secondary | ICD-10-CM

## 2017-01-14 DIAGNOSIS — D72819 Decreased white blood cell count, unspecified: Secondary | ICD-10-CM | POA: Diagnosis not present

## 2017-01-14 DIAGNOSIS — J9811 Atelectasis: Secondary | ICD-10-CM

## 2017-01-14 DIAGNOSIS — I959 Hypotension, unspecified: Secondary | ICD-10-CM | POA: Diagnosis not present

## 2017-01-14 DIAGNOSIS — G8929 Other chronic pain: Secondary | ICD-10-CM | POA: Diagnosis present

## 2017-01-14 DIAGNOSIS — E669 Obesity, unspecified: Secondary | ICD-10-CM | POA: Diagnosis present

## 2017-01-14 DIAGNOSIS — J810 Acute pulmonary edema: Secondary | ICD-10-CM

## 2017-01-14 DIAGNOSIS — R509 Fever, unspecified: Secondary | ICD-10-CM

## 2017-01-14 DIAGNOSIS — J111 Influenza due to unidentified influenza virus with other respiratory manifestations: Secondary | ICD-10-CM

## 2017-01-14 DIAGNOSIS — D696 Thrombocytopenia, unspecified: Secondary | ICD-10-CM | POA: Diagnosis not present

## 2017-01-14 DIAGNOSIS — Z87891 Personal history of nicotine dependence: Secondary | ICD-10-CM | POA: Diagnosis not present

## 2017-01-14 DIAGNOSIS — E119 Type 2 diabetes mellitus without complications: Secondary | ICD-10-CM

## 2017-01-14 DIAGNOSIS — J180 Bronchopneumonia, unspecified organism: Secondary | ICD-10-CM | POA: Diagnosis present

## 2017-01-14 DIAGNOSIS — J189 Pneumonia, unspecified organism: Secondary | ICD-10-CM | POA: Diagnosis present

## 2017-01-14 DIAGNOSIS — Z79899 Other long term (current) drug therapy: Secondary | ICD-10-CM

## 2017-01-14 DIAGNOSIS — Z794 Long term (current) use of insulin: Secondary | ICD-10-CM

## 2017-01-14 DIAGNOSIS — J09X2 Influenza due to identified novel influenza A virus with other respiratory manifestations: Secondary | ICD-10-CM

## 2017-01-14 DIAGNOSIS — J1008 Influenza due to other identified influenza virus with other specified pneumonia: Secondary | ICD-10-CM | POA: Diagnosis present

## 2017-01-14 DIAGNOSIS — I5033 Acute on chronic diastolic (congestive) heart failure: Secondary | ICD-10-CM | POA: Diagnosis not present

## 2017-01-14 DIAGNOSIS — Z7984 Long term (current) use of oral hypoglycemic drugs: Secondary | ICD-10-CM | POA: Diagnosis not present

## 2017-01-14 DIAGNOSIS — B9620 Unspecified Escherichia coli [E. coli] as the cause of diseases classified elsewhere: Secondary | ICD-10-CM | POA: Diagnosis present

## 2017-01-14 DIAGNOSIS — IMO0002 Reserved for concepts with insufficient information to code with codable children: Secondary | ICD-10-CM

## 2017-01-14 LAB — COMPREHENSIVE METABOLIC PANEL
ALK PHOS: 164 U/L — AB (ref 38–126)
ALT: 23 U/L (ref 14–54)
ANION GAP: 12 (ref 5–15)
AST: 29 U/L (ref 15–41)
Albumin: 3.4 g/dL — ABNORMAL LOW (ref 3.5–5.0)
BILIRUBIN TOTAL: 0.3 mg/dL (ref 0.3–1.2)
BUN: 11 mg/dL (ref 6–20)
CALCIUM: 8.7 mg/dL — AB (ref 8.9–10.3)
CO2: 19 mmol/L — ABNORMAL LOW (ref 22–32)
CREATININE: 0.57 mg/dL (ref 0.44–1.00)
Chloride: 96 mmol/L — ABNORMAL LOW (ref 101–111)
GFR calc Af Amer: >60 mL/min (ref >60)
GFR calc non Af Amer: >60 mL/min (ref >60)
GLUCOSE: 258 mg/dL — AB (ref 65–99)
Potassium: 3.7 mmol/L (ref 3.5–5.1)
Sodium: 127 mmol/L — ABNORMAL LOW (ref 135–145)
Total Protein: 8 g/dL (ref 6.5–8.1)

## 2017-01-14 LAB — CBC WITH DIFFERENTIAL/PLATELET
BASOS PCT: 0 %
Basophils Absolute: 0 10*3/uL (ref 0.0–0.1)
EOS ABS: 0 10*3/uL (ref 0.0–0.7)
EOS PCT: 0 %
HCT: 43.9 % (ref 36.0–46.0)
HEMOGLOBIN: 14.9 g/dL (ref 12.0–15.0)
Lymphocytes Relative: 31 %
Lymphs Abs: 1.6 10*3/uL (ref 0.7–4.0)
MCH: 28.8 pg (ref 26.0–34.0)
MCHC: 33.9 g/dL (ref 30.0–36.0)
MCV: 84.7 fL (ref 78.0–100.0)
MONOS PCT: 6 %
Monocytes Absolute: 0.3 10*3/uL (ref 0.1–1.0)
NEUTROS PCT: 63 %
Neutro Abs: 3.2 10*3/uL (ref 1.7–7.7)
PLATELETS: 153 10*3/uL (ref 150–400)
RBC: 5.18 MIL/uL — ABNORMAL HIGH (ref 3.87–5.11)
RDW: 12.7 % (ref 11.5–15.5)
WBC: 5.1 10*3/uL (ref 4.0–10.5)

## 2017-01-14 LAB — URINALYSIS, ROUTINE W REFLEX MICROSCOPIC
Bilirubin Urine: NEGATIVE
Glucose, UA: >=500 mg/dL — AB
Hgb urine dipstick: NEGATIVE
Ketones, ur: 80 mg/dL — AB
LEUKOCYTES UA: NEGATIVE
Nitrite: POSITIVE — AB
PROTEIN: 100 mg/dL — AB
SPECIFIC GRAVITY, URINE: 1.028 (ref 1.005–1.030)
pH: 5 (ref 5.0–8.0)

## 2017-01-14 LAB — I-STAT CG4 LACTIC ACID, ED: Lactic Acid, Venous: 1.41 mmol/L (ref 0.5–1.9)

## 2017-01-14 LAB — LIPASE, BLOOD: LIPASE: 18 U/L (ref 11–51)

## 2017-01-14 LAB — PROTIME-INR
INR: 0.98
PROTHROMBIN TIME: 13 s (ref 11.4–15.2)

## 2017-01-14 MED ORDER — IBUPROFEN 400 MG PO TABS
400.0000 mg | ORAL_TABLET | Freq: Once | ORAL | Status: AC
  Administered 2017-01-14: 400 mg via ORAL
  Filled 2017-01-14: qty 1

## 2017-01-14 MED ORDER — SODIUM CHLORIDE 0.9 % IV BOLUS (SEPSIS)
1000.0000 mL | Freq: Once | INTRAVENOUS | Status: AC
  Administered 2017-01-14: 1000 mL via INTRAVENOUS

## 2017-01-14 MED ORDER — AZITHROMYCIN 250 MG PO TABS
500.0000 mg | ORAL_TABLET | Freq: Once | ORAL | Status: AC
  Administered 2017-01-14: 500 mg via ORAL
  Filled 2017-01-14: qty 2

## 2017-01-14 MED ORDER — SODIUM CHLORIDE 0.9 % IV BOLUS (SEPSIS)
1000.0000 mL | INTRAVENOUS | Status: AC
  Administered 2017-01-14: 1000 mL via INTRAVENOUS

## 2017-01-14 MED ORDER — DEXTROSE 5 % IV SOLN
1.0000 g | Freq: Once | INTRAVENOUS | Status: AC
  Administered 2017-01-14: 1 g via INTRAVENOUS
  Filled 2017-01-14: qty 10

## 2017-01-14 MED ORDER — ACETAMINOPHEN 325 MG PO TABS: 650.0000 mg | ORAL_TABLET | Freq: Once | ORAL | Status: DC

## 2017-01-14 MED ORDER — ACETAMINOPHEN 500 MG PO TABS
ORAL_TABLET | ORAL | Status: AC
  Administered 2017-01-14: 1000 mg
  Filled 2017-01-14: qty 2

## 2017-01-14 NOTE — ED Provider Notes (Signed)
[facility] DEPT Provider Note   CSN: [id number] Arrival date & time: 01/14/17  1941     History   Chief Complaint Chief Complaint  Patient presents with  . Fever    Fever since Friday with achy bones; unable to eat; weakness    HPI Denise Mosley is a 56 y.o. female with a hx of Hypertension, non-insulin-dependent diabetes presents to the Emergency Department complaining of gradual, persistent, progressively worsening Generalized weakness with associated cough and fever onset 3 days ago. 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 No international travel. Patient reports she does not check her blood sugar at home. She does report significant increased by mouth intake over the last 3 days due to this febrile illness. She arrives in the emergency department with fever of 103F.  She denies chest pain, abdominal pain, nausea, vomiting, diarrhea, dysuria, hematuria. She denies polyuria and polydipsia.   The history is provided by the patient and medical records. The history is limited by a language barrier. A language interpreter was used.    Past Medical History:  Diagnosis Date  . Diabetes mellitus without complication (HCC)   . Hypertension     Patient Active Problem List   Diagnosis Date Noted  . CAP (community acquired pneumonia) 01/14/2017    Past Surgical History:  Procedure Laterality Date  . CESAREAN SECTION    . CHOLECYSTECTOMY      OB History    No data available       Home Medications    Prior to Admission medications   Medication Sig Start Date End Date Taking? Authorizing Provider  acetaminophen (TYLENOL) 500 MG tablet Take 1,000 mg by mouth every 6 (six) hours as needed for mild pain.     Historical Provider, MD  cephALEXin (KEFLEX) 500 MG capsule Take 1 capsule (500 mg total) by mouth 4 (four) times daily. 11/20/14   Gilda Crease, MD  HYDROcodone-acetaminophen (NORCO/VICODIN) 5-325 MG per tablet Take 2 tablets by  mouth every 4 (four) hours as needed for moderate pain. 11/20/14   Gilda Crease, MD  ibuprofen (ADVIL,MOTRIN) 600 MG tablet Take 1 tablet (600 mg total) by mouth every 6 (six) hours as needed for mild pain or moderate pain. 08/21/16   Antony Madura, PA-C  metFORMIN (GLUCOPHAGE) 500 MG tablet Take 1 tablet (500 mg total) by mouth 2 (two) times daily with a meal. 08/21/16   Antony Madura, PA-C  predniSONE (DELTASONE) 20 MG tablet Take 2 tablets (40 mg total) by mouth daily. Take 40 mg by mouth daily for 3 days, then  by mouth daily for 3 days, then  daily for 3 days 08/21/16   Antony Madura, PA-C  valACYclovir (VALTREX) 1000 MG tablet Take 1 tablet (1,000 mg total) by mouth 3 (three) times daily. 08/21/16   Antony Madura, PA-C    Family History History reviewed. No pertinent family history.  Social History Social History  Substance Use Topics  . Smoking status: Former Games developer  . Smokeless tobacco: Never Used     Comment: Quit 4 years ago  . Alcohol use No     Allergies   Patient has no known allergies.   Review of Systems Review of Systems  Constitutional: Positive for appetite change, fatigue and fever.  HENT: Negative for congestion, postnasal drip, rhinorrhea and sinus pain.   Respiratory: Positive for cough and shortness of breath.   Cardiovascular: Negative for chest pain.  Gastrointestinal: Negative for abdominal pain, nausea and  vomiting.  Genitourinary: Negative for dysuria.  Musculoskeletal: Positive for myalgias. Negative for gait problem.  Skin: Negative for rash.  Neurological: Positive for weakness ( generalized).  All other systems reviewed and are negative.    Physical Exam Updated Vital Signs BP 102/68 (BP Location: Right Arm)   Pulse 94   Temp (!) 100.8 F (38.2 C) (Oral)   Resp 20   SpO2 93%   Physical Exam  Constitutional: She appears well-developed and well-nourished. No distress.  Awake, alert, Ill appearing  HENT:  Head: Normocephalic and  atraumatic.  Mouth/Throat: Oropharynx is clear and moist. No oropharyngeal exudate.  Eyes: Conjunctivae are normal. No scleral icterus.  Neck: Normal range of motion. Neck supple.  Cardiovascular: Regular rhythm and intact distal pulses.  Tachycardia present.   Pulses:      Radial pulses are 2+ on the right side, and 2+ on the left side.       Dorsalis pedis pulses are 2+ on the right side, and 2+ on the left side.  Pulmonary/Chest: Effort normal. Tachypnea noted. No respiratory distress. She has decreased breath sounds ( Throughout). She has no wheezes. She has rales in the right middle field, the right lower field, the left middle field and the left lower field.  Equal chest expansion Course breath sounds throughout; Rales and rhonchi in Bilateral lower lung fields  Abdominal: Soft. Bowel sounds are normal. She exhibits no mass. There is no tenderness. There is no rebound and no guarding.  Fully reducible hernia palpable  Musculoskeletal: Normal range of motion. She exhibits no edema.  Neurological: She is alert.  Speech is clear and goal oriented Moves extremities without ataxia  Skin: Skin is warm and dry. She is not diaphoretic.  Flushed and hot to touch  Psychiatric: She has a normal mood and affect.  Nursing note and vitals reviewed.    ED Treatments / Results  Labs (all labs ordered are listed, but only abnormal results are displayed) Labs Reviewed  COMPREHENSIVE METABOLIC PANEL - Abnormal; Notable for the following:       Result Value   Sodium 127 (*)    Chloride 96 (*)    CO2 19 (*)    Glucose, Bld 258 (*)    Calcium 8.7 (*)    Albumin 3.4 (*)    Alkaline Phosphatase 164 (*)    All other components within normal limits  CBC WITH DIFFERENTIAL/PLATELET - Abnormal; Notable for the following:    RBC 5.18 (*)    All other components within normal limits  URINALYSIS, ROUTINE W REFLEX MICROSCOPIC - Abnormal; Notable for the following:    Color, Urine AMBER (*)     Glucose, UA >=500 (*)    Ketones, ur 80 (*)    Protein, ur 100 (*)    Nitrite POSITIVE (*)    Bacteria, UA FEW (*)    Squamous Epithelial / LPF 0-5 (*)    All other components within normal limits  CULTURE, BLOOD (ROUTINE X 2)  CULTURE, BLOOD (ROUTINE X 2)  URINE CULTURE  PROTIME-INR  LIPASE, BLOOD  INFLUENZA PANEL BY PCR (TYPE A & B)  I-STAT CG4 LACTIC ACID, ED    EKG  EKG Interpretation  Date/Time:  Tuesday Jan 14 2017 21:49:56 EDT Ventricular Rate:  91 PR Interval:    QRS Duration: 95 QT Interval:  374 QTC Calculation: 461 R Axis:   -70 Text Interpretation:  Sinus rhythm LAD, consider left anterior fascicular block No STEMI.  Confirmed by LONG MD,  JOSHUA [id number]) on 01/14/2017 9:54:42 PM       Radiology Dg Chest 2 View  Result Date: 01/14/2017 CLINICAL DATA:  Fever, lethargy and back pain tonight. EXAM: CHEST  2 VIEW COMPARISON:  06/27/2014 FINDINGS: There is confluent consolidation of the left lung base. Patchy opacity in the central right lung may represent an additional area of airspace consolidation. No pleural effusion. Normal pulmonary vasculature. Hilar, mediastinal and cardiac contours are unremarkable and unchanged. IMPRESSION: Consolidation in the left base and in the central right lung. This may represent pneumonia. Followup PA and lateral chest X-ray is recommended in 3-4 weeks following trial of antibiotic therapy to ensure resolution and exclude underlying malignancy. Electronically Signed   By: Ellery Plunk M.D.   On: 01/14/2017 21:38    Procedures Procedures (including critical care time)  Medications Ordered in ED Medications  acetaminophen (TYLENOL) tablet 650 mg (0 mg Oral Hold 01/14/17 2106)  cefTRIAXone (ROCEPHIN) 1 g in dextrose 5 % 50 mL IVPB (1 g Intravenous New Bag/Given 01/14/17 2312)  acetaminophen (TYLENOL) 500 MG tablet (1,000 mg  Given 01/14/17 2039)  sodium chloride 0.9 % bolus 1,000 mL (0 mLs Intravenous Stopped 01/14/17 2313)  ibuprofen  (ADVIL,MOTRIN) tablet 400 mg (400 mg Oral Given 01/14/17 2312)  azithromycin (ZITHROMAX) tablet 500 mg (500 mg Oral Given 01/14/17 2312)  sodium chloride 0.9 % bolus 1,000 mL (1,000 mLs Intravenous New Bag/Given 01/14/17 2317)     Initial Impression / Assessment and Plan / ED Course  I have reviewed the triage vital signs and the nursing notes.  Pertinent labs & imaging results that were available during my care of the patient were reviewed by me and considered in my medical decision making (see chart for details).  Clinical Course as of Jan 14 2330  Tue Jan 14, 2017  2308 Discussed with Dr. Toniann Fail who will admit.    [HM]  2311 Patient now hypoxic, sleeping at 88% on room air. Nasal cannula applied.  [HM]    Clinical Course User Index [HM] Dierdre Forth, PA-C    Patient with pneumonia on her chest x-ray. Hypoxia noted while sleeping on room air. Patient with significant dehydration likely secondary to her poor by mouth intake. Patient with elevated blood glucose and ketones however normal anion gap. I do not believe the patient is in DKA and that her electronic abnormalities are likely due to her dehydration. Patient has been given fluids. Normal lactic acid. Blood cultures are pending. Patient will be admitted for rehydration, trending of her glucose and antibiotics.  Final Clinical Impressions(s) / ED Diagnoses   Final diagnoses:  Febrile illness  Pneumonia of both lungs due to infectious organism, unspecified part of lung  Dehydration  Hyperglycemia  Hyponatremia  Decreased appetite    New Prescriptions New Prescriptions   No medications on file     Dierdre Forth, PA-C 01/14/17 2331    Maia Plan, MD 01/15/17 1102

## 2017-01-14 NOTE — ED Notes (Signed)
Patient transported to X-ray 

## 2017-01-14 NOTE — ED Triage Notes (Signed)
Patient has been ill since last Friday. +fever, no appetite. Adds that + cough and back pain, weakness.

## 2017-01-15 ENCOUNTER — Encounter (HOSPITAL_COMMUNITY): Payer: Self-pay | Admitting: Internal Medicine

## 2017-01-15 DIAGNOSIS — E860 Dehydration: Secondary | ICD-10-CM

## 2017-01-15 DIAGNOSIS — E871 Hypo-osmolality and hyponatremia: Secondary | ICD-10-CM

## 2017-01-15 DIAGNOSIS — E119 Type 2 diabetes mellitus without complications: Secondary | ICD-10-CM

## 2017-01-15 LAB — BASIC METABOLIC PANEL
Anion gap: 10 (ref 5–15)
BUN: 11 mg/dL (ref 6–20)
CALCIUM: 8.2 mg/dL — AB (ref 8.9–10.3)
CO2: 23 mmol/L (ref 22–32)
Chloride: 102 mmol/L (ref 101–111)
Creatinine, Ser: 0.63 mg/dL (ref 0.44–1.00)
GFR calc Af Amer: >60 mL/min (ref >60)
GFR calc non Af Amer: >60 mL/min (ref >60)
GLUCOSE: 323 mg/dL — AB (ref 65–99)
POTASSIUM: 3.6 mmol/L (ref 3.5–5.1)
SODIUM: 135 mmol/L (ref 135–145)

## 2017-01-15 LAB — PROCALCITONIN: PROCALCITONIN: 0.33 ng/mL

## 2017-01-15 LAB — CBC
HCT: 38 % (ref 36.0–46.0)
HEMOGLOBIN: 12.6 g/dL (ref 12.0–15.0)
MCH: 28.4 pg (ref 26.0–34.0)
MCHC: 33.2 g/dL (ref 30.0–36.0)
MCV: 85.6 fL (ref 78.0–100.0)
Platelets: 141 10*3/uL — ABNORMAL LOW (ref 150–400)
RBC: 4.44 MIL/uL (ref 3.87–5.11)
RDW: 12.6 % (ref 11.5–15.5)
WBC: 3.9 10*3/uL — ABNORMAL LOW (ref 4.0–10.5)

## 2017-01-15 LAB — STREP PNEUMONIAE URINARY ANTIGEN: Strep Pneumo Urinary Antigen: NEGATIVE

## 2017-01-15 LAB — HIV ANTIBODY (ROUTINE TESTING W REFLEX): HIV Screen 4th Generation wRfx: NONREACTIVE

## 2017-01-15 LAB — GLUCOSE, CAPILLARY
GLUCOSE-CAPILLARY: 250 mg/dL — AB (ref 65–99)
Glucose-Capillary: 297 mg/dL — ABNORMAL HIGH (ref 65–99)
Glucose-Capillary: 207 mg/dL — ABNORMAL HIGH (ref 65–99)

## 2017-01-15 LAB — INFLUENZA PANEL BY PCR (TYPE A & B)
Influenza A By PCR: POSITIVE — AB
Influenza B By PCR: NEGATIVE

## 2017-01-15 MED ORDER — ACETAMINOPHEN 325 MG PO TABS
650.0000 mg | ORAL_TABLET | Freq: Four times a day (QID) | ORAL | Status: DC | PRN
  Administered 2017-01-15: 650 mg via ORAL

## 2017-01-15 MED ORDER — ACETAMINOPHEN 650 MG RE SUPP: 650.0000 mg | Freq: Four times a day (QID) | RECTAL | Status: DC | PRN

## 2017-01-15 MED ORDER — OSELTAMIVIR PHOSPHATE 75 MG PO CAPS
75.0000 mg | ORAL_CAPSULE | Freq: Two times a day (BID) | ORAL | Status: AC
  Administered 2017-01-15 – 2017-01-20 (×10): 75 mg via ORAL
  Filled 2017-01-15 (×10): qty 1

## 2017-01-15 MED ORDER — ACETAMINOPHEN 325 MG PO TABS
650.0000 mg | ORAL_TABLET | ORAL | Status: DC | PRN
Start: 2017-01-15 — End: 2017-01-23
  Administered 2017-01-15 – 2017-01-21 (×7): 650 mg via ORAL
  Filled 2017-01-15 (×7): qty 2

## 2017-01-15 MED ORDER — IBUPROFEN 400 MG PO TABS
400.0000 mg | ORAL_TABLET | Freq: Three times a day (TID) | ORAL | Status: AC | PRN
  Administered 2017-01-15 – 2017-01-16 (×4): 400 mg via ORAL
  Filled 2017-01-15 (×5): qty 1

## 2017-01-15 MED ORDER — INSULIN ASPART 100 UNIT/ML ~~LOC~~ SOLN
0.0000 [IU] | Freq: Three times a day (TID) | SUBCUTANEOUS | Status: DC
  Administered 2017-01-15: 3 [IU] via SUBCUTANEOUS
  Administered 2017-01-15: 5 [IU] via SUBCUTANEOUS
  Administered 2017-01-15: 3 [IU] via SUBCUTANEOUS
  Administered 2017-01-16: 5 [IU] via SUBCUTANEOUS
  Administered 2017-01-16: 2 [IU] via SUBCUTANEOUS
  Administered 2017-01-16: 3 [IU] via SUBCUTANEOUS

## 2017-01-15 MED ORDER — INSULIN GLARGINE 100 UNIT/ML ~~LOC~~ SOLN
5.0000 [IU] | Freq: Every day | SUBCUTANEOUS | Status: DC
  Administered 2017-01-15 – 2017-01-16 (×2): 5 [IU] via SUBCUTANEOUS
  Filled 2017-01-15 (×2): qty 0.05

## 2017-01-15 MED ORDER — ACETAMINOPHEN 650 MG RE SUPP
650.0000 mg | Freq: Four times a day (QID) | RECTAL | Status: DC | PRN
Start: 2017-01-15 — End: 2017-01-15

## 2017-01-15 MED ORDER — ENOXAPARIN SODIUM 40 MG/0.4ML ~~LOC~~ SOLN
40.0000 mg | SUBCUTANEOUS | Status: DC
  Administered 2017-01-15 – 2017-01-23 (×9): 40 mg via SUBCUTANEOUS
  Filled 2017-01-15 (×8): qty 0.4

## 2017-01-15 MED ORDER — GUAIFENESIN-DM 100-10 MG/5ML PO SYRP
5.0000 mL | ORAL_SOLUTION | ORAL | Status: DC | PRN
  Administered 2017-01-16 – 2017-01-17 (×5): 5 mL via ORAL
  Filled 2017-01-15 (×5): qty 5

## 2017-01-15 MED ORDER — CEFTRIAXONE SODIUM 1 G IJ SOLR
1.0000 g | INTRAMUSCULAR | Status: DC
  Administered 2017-01-15 – 2017-01-16 (×2): 1 g via INTRAVENOUS
  Filled 2017-01-15 (×2): qty 10

## 2017-01-15 MED ORDER — ONDANSETRON HCL 4 MG PO TABS: 4.0000 mg | ORAL_TABLET | Freq: Four times a day (QID) | ORAL | Status: DC | PRN

## 2017-01-15 MED ORDER — ACETAMINOPHEN 325 MG PO TABS
650.0000 mg | ORAL_TABLET | Freq: Four times a day (QID) | ORAL | Status: DC | PRN
  Administered 2017-01-15: 650 mg via ORAL
  Filled 2017-01-15 (×2): qty 2

## 2017-01-15 MED ORDER — SODIUM CHLORIDE 0.9 % IV SOLN
INTRAVENOUS | Status: DC
  Administered 2017-01-15 (×2): via INTRAVENOUS

## 2017-01-15 MED ORDER — SODIUM CHLORIDE 0.9 % IV SOLN
INTRAVENOUS | Status: AC
  Administered 2017-01-16: 1000 mL via INTRAVENOUS

## 2017-01-15 MED ORDER — ONDANSETRON HCL 4 MG/2ML IJ SOLN: 4.0000 mg | Freq: Four times a day (QID) | INTRAMUSCULAR | Status: DC | PRN

## 2017-01-15 MED ORDER — SODIUM CHLORIDE 0.9 % IV BOLUS (SEPSIS)
500.0000 mL | Freq: Once | INTRAVENOUS | Status: AC
  Administered 2017-01-15: 500 mL via INTRAVENOUS

## 2017-01-15 MED ORDER — DEXTROSE 5 % IV SOLN
500.0000 mg | INTRAVENOUS | Status: DC
  Administered 2017-01-15: 500 mg via INTRAVENOUS
  Filled 2017-01-15 (×2): qty 500

## 2017-01-15 NOTE — Progress Notes (Signed)
Pt arrived on unit in no acute distress. She does not speak english; hence, video interpreting service was used. Assessment and vital signs completed.  During the process of completing admission questioner, RN found that pt had some cash on her.  Pt was asked if she would like to lock the cash up but she declined saying her sone is coming to pick it up.  She is in bed resting.  Will continue to monitor.

## 2017-01-15 NOTE — H&P (Signed)
History and Physical    Denise Mosley ZOX:[id number] DOB: 1961-02-23 DOA: 01/14/2017  PCP: No PCP Per Patient  Patient coming from: Home.  Chief Complaint: Shortness of breath and fever.  Spanish Nurse, learning disability used Stage manager.  HPI: Denise Mosley is a 56 y.o. female with history of diabetes mellitus type 2 on metformin has been having productive cough shortness of breath with back pain and weakness over the last 3 days. Patient's symptoms have been progressively worsening. Patient has been having poor appetite and felt weak. Patient felt weak and was unable to walk but did not loose function of the extremities.   ED Course: In the ER patient was found to be febrile and tachycardic with leukocytosis. Chest x-ray shows consolidation consistent with pneumonia. On exam patient appears nonfocal. But generally weak. Labs reveal normal anion gap with low bicarbonate. Patient was given fluid bolus for dehydration and started on antibiotics for community-acquired pneumonia after blood cultures were obtained.  Review of Systems: As per HPI, rest all negative.   Past Medical History:  Diagnosis Date  . Diabetes mellitus without complication (HCC)   . Hypertension     Past Surgical History:  Procedure Laterality Date  . CESAREAN SECTION    . CHOLECYSTECTOMY       reports that she has quit smoking. She has never used smokeless tobacco. She reports that she does not drink alcohol or use drugs.  No Known Allergies  Family History  Problem Relation Age of Onset  . Diabetes Mother   . Diabetes Father     Prior to Admission medications   Medication Sig Start Date End Date Taking? Authorizing Provider  acetaminophen (TYLENOL) 500 MG tablet Take 1,000 mg by mouth every 6 (six) hours as needed for mild pain.     Historical Provider, MD  cephALEXin (KEFLEX) 500 MG capsule Take 1 capsule (500 mg total) by mouth 4 (four) times daily. 11/20/14   Gilda Crease, MD    HYDROcodone-acetaminophen (NORCO/VICODIN) 5-325 MG per tablet Take 2 tablets by mouth every 4 (four) hours as needed for moderate pain. 11/20/14   Gilda Crease, MD  ibuprofen (ADVIL,MOTRIN) 600 MG tablet Take 1 tablet (600 mg total) by mouth every 6 (six) hours as needed for mild pain or moderate pain. 08/21/16   Antony Madura, PA-C  metFORMIN (GLUCOPHAGE) 500 MG tablet Take 1 tablet (500 mg total) by mouth 2 (two) times daily with a meal. 08/21/16   Antony Madura, PA-C  predniSONE (DELTASONE) 20 MG tablet Take 2 tablets (40 mg total) by mouth daily. Take 40 mg by mouth daily for 3 days, then  by mouth daily for 3 days, then  daily for 3 days 08/21/16   Antony Madura, PA-C  valACYclovir (VALTREX) 1000 MG tablet Take 1 tablet (1,000 mg total) by mouth 3 (three) times daily. 08/21/16   Antony Madura, PA-C    Physical Exam: Vitals:   01/14/17 2245 01/14/17 2300 01/14/17 2315 01/15/17 0039  BP: 105/64 103/66 120/83 110/63  Pulse: 85 84 80 75  Resp: Temp:    98 F (36.7 C)  TempSrc:      SpO2: (!) 88% (!) 87% 95% 95%      Constitutional: Moderately built and nourished. Vitals:   01/14/17 2245 01/14/17 2300 01/14/17 2315 01/15/17 0039  BP: 105/64 103/66 120/83 110/63  Pulse: 85 84 80 75  Resp: Temp:    98 F (36.7 C)  TempSrc:      SpO2: (!) 88% (!) 87% 95% 95%   Eyes: Anicteric. No pallor. ENMT: No discharge from the ears eyes nose and mouth. Neck: No JVD appreciated no neck rigidity. No mass felt. Respiratory: No rhonchi or crepitations. Cardiovascular: S1-S2 heard no murmurs appreciated. Abdomen: Soft nontender bowel sounds present.  Musculoskeletal: No edema. No joint effusion. Skin: No rash. Skin appears warm. Neurologic: Alert awake oriented to time place and person. Moves all extremities. Psychiatric: Appears normal. Normal affect.   Labs on Admission: I have personally reviewed following labs and imaging studies  CBC:  Recent Labs Lab  01/14/17 2042  WBC 5.1  NEUTROABS 3.2  HGB 14.9  HCT 43.9  MCV 84.7  PLT 153   Basic Metabolic Panel:  Recent Labs Lab 01/14/17 2042  NA 127*  K 3.7  CL 96*  CO2 19*  GLUCOSE 258*  BUN 11  CREATININE 0.57  CALCIUM 8.7*   GFR: CrCl cannot be calculated (Unknown ideal weight.). Liver Function Tests:  Recent Labs Lab 01/14/17 2042  AST 29  ALT 23  ALKPHOS 164*  BILITOT 0.3  PROT 8.0  ALBUMIN 3.4*    Recent Labs Lab 01/14/17 2042  LIPASE 18   No results for input(s): AMMONIA in the last 168 hours. Coagulation Profile:  Recent Labs Lab 01/14/17 2042  INR 0.98   Cardiac Enzymes: No results for input(s): CKTOTAL, CKMB, CKMBINDEX, TROPONINI in the last 168 hours. BNP (last 3 results) No results for input(s): PROBNP in the last 8760 hours. HbA1C: No results for input(s): HGBA1C in the last 72 hours. CBG: No results for input(s): GLUCAP in the last 168 hours. Lipid Profile: No results for input(s): CHOL, HDL, LDLCALC, TRIG, CHOLHDL, LDLDIRECT in the last 72 hours. Thyroid Function Tests: No results for input(s): TSH, T4TOTAL, FREET4, T3FREE, THYROIDAB in the last 72 hours. Anemia Panel: No results for input(s): VITAMINB12, FOLATE, FERRITIN, TIBC, IRON, RETICCTPCT in the last 72 hours. Urine analysis:    Component Value Date/Time   COLORURINE AMBER (A) 01/14/2017 2033   APPEARANCEUR CLEAR 01/14/2017 2033   LABSPEC 1.028 01/14/2017 2033   PHURINE 5.0 01/14/2017 2033   GLUCOSEU >=500 (A) 01/14/2017 2033   HGBUR NEGATIVE 01/14/2017 2033   BILIRUBINUR NEGATIVE 01/14/2017 2033   KETONESUR 80 (A) 01/14/2017 2033   PROTEINUR 100 (A) 01/14/2017 2033   UROBILINOGEN 0.2 11/20/2014 1408   NITRITE POSITIVE (A) 01/14/2017 2033   LEUKOCYTESUR NEGATIVE 01/14/2017 2033   Sepsis Labs: (procalcitonin:4,lacticidven:4) ) Recent Results (from the past 240 hour(s))  Blood culture (routine x 2)     Status: None (Preliminary result)   Collection Time:  01/14/17  8:27 PM  Result Value Ref Range Status   Specimen Description BLOOD RIGHT ARM  Final   Special Requests   Final    BOTTLES DRAWN AEROBIC AND ANAEROBIC Blood Culture adequate volume   Culture PENDING  Incomplete   Report Status PENDING  Incomplete  Blood culture (routine x 2)     Status: None (Preliminary result)   Collection Time: 01/14/17  8:35 PM  Result Value Ref Range Status   Specimen Description BLOOD LEFT ARM  Final   Special Requests IN PEDIATRIC BOTTLE Blood Culture adequate volume  Final   Culture PENDING  Incomplete   Report Status PENDING  Incomplete     Radiological Exams on Admission: Dg Chest 2 View  Result Date: 01/14/2017 CLINICAL DATA:  Fever, lethargy and back pain tonight. EXAM: CHEST  2 VIEW COMPARISON:  06/27/2014 FINDINGS: There is confluent consolidation of the left lung base. Patchy opacity in the central right lung may represent an additional area of airspace consolidation. No pleural effusion. Normal pulmonary vasculature. Hilar, mediastinal and cardiac contours are unremarkable and unchanged. IMPRESSION: Consolidation in the left base and in the central right lung. This may represent pneumonia. Followup PA and lateral chest X-ray is recommended in 3-4 weeks following trial of antibiotic therapy to ensure resolution and exclude underlying malignancy. Electronically Signed   By: Ellery Plunk M.D.   On: 01/14/2017 21:38    EKG: Independently reviewed. Normal sinus rhythm.  Assessment/Plan Principal Problem:   CAP (community acquired pneumonia) Active Problems:   Hyponatremia   Dehydration   Diabetes mellitus type 2, controlled (HCC)    1. Community-acquired pneumonia with possible developing sepsis - patient has been placed on ceftriaxone and Zithromax. Check influenza PCR urine for Legionella and strep antigen and follow cultures. Continue with hydration. 2. Dehydration - continue with hydration. Patient has been having poor oral intake since  fever started. Abdomen appears benign. 3. Diabetes mellitus type 2 uncontrolled with hyperglycemia - hemoglobin A1c unknown. Check hemoglobin A1c. We'll place patient on Lantus 5 units with sliding scale coverage. Hold metformin while inpatient. Closely follow CBGs.   DVT prophylaxis: Lovenox. Code Status: Full code.  Family Communication: Discussed with patient.  Disposition Plan: Home.  Consults called: None.  Admission status: Inpatient.    Eduard Clos MD [facility] Pager [phone].  If 7PM-7AM, please contact night-coverage [email or web address] Password [email or web address]  01/15/2017, 3:14 AM

## 2017-01-15 NOTE — Progress Notes (Signed)
Addendum  Since my earlier visit, as per RN, patient has had couple of episodes of high fever associated with chills. As per report, no history of recent infections or travel. Chronic low back pain which is unchanged.  I discussed with infectious disease M.D. on call who recommended that it may not be sufficient time on current antibiotics and since she is not unstable, would continue current IV ceftriaxone and azithromycin and consider change if she decompensates in some way. Blood pressure soft with SBP in the 90s but patient mentating well and in no obvious distress. Normal saline bolus 500 mL 1 dose. Discussed with RN.  Marcellus Scott, MD, FACP, FHM. [facility] Pager [phone]  If 7PM-7AM, please contact night-coverage [email or web address] Password [email or web address] 01/15/2017, 6:10 PM

## 2017-01-15 NOTE — Progress Notes (Signed)
Received call from MD [facility]- told to give patient a bolus and some more tylenol. Also told to watch patient closely tonight and call with any changes. Will pass on to night RN.

## 2017-01-15 NOTE — Progress Notes (Signed)
Patient states she feels much better and only a little short of breath while ambulating in room. Sp02 on room air while ambulating 93-96%

## 2017-01-15 NOTE — Progress Notes (Signed)
Patient has a fever of 101.4 and some chills. MD has been made aware- awaiting further orders. Will continue to monitor

## 2017-01-15 NOTE — Progress Notes (Signed)
PROGRESS NOTE   Denise Mosley  FTD:[id number]    DOB: 1960/11/22    DOA: 01/14/2017  PCP: No PCP Per Patient   I have briefly reviewed patients previous medical records in [facility].  Brief Narrative:  [name], lives with adult son, does not have a PCP, IADL, PMH of type II DM, HTN, former smoker, chronic back pain, presented to ED with 3 days history of productive cough, dyspnea, weakness, poor appetite and weakness. In ED, fever of 103.1, tachycardic 113/m, transient hypoxia 87-88 percent, normal lactate, sodium 127 and chest x-ray suggestive of lobar pneumonia. Admitted for community-acquired pneumonia.   Assessment & Plan:   Principal Problem:   CAP (community acquired pneumonia) Active Problems:   Hyponatremia   Dehydration   Diabetes mellitus type 2, controlled ([place])   1. Community-acquired pneumonia: HIV antibody, flu panel PCR, urine Legionella antigen: Pending. Urine pneumococcal antigen negative. Blood cultures 2: Negative to date. Treating empirically with IV ceftriaxone and azithromycin. Will need follow-up chest x-ray in 4 weeks to ensure resolution of pneumonia findings. 2. Sepsis secondary to pneumonia: Met sepsis criteria on admission. Sepsis physiology have improved. 3. Acute respiratory failure with hypoxia: Transient hypoxia noted in ED, likely due to CPAP. Resolved. 4. Dehydration with hyponatremia: Sodium 127 on admission which may be a combination of dehydration and hyperglycemia. Dehydration clinically improved. Continue additional day of gentle hydration. Hyponatremia resolved. 5. Uncontrolled type II DM: Placed on Lantus 5 units daily and NovoLog SSI. Follow A1c. May need further titration. Home metformin on hold. Needs close outpatient follow-up with a new PCP. His cast with patient and she verbalizes understanding. 6. Anemia and thrombocytopenia:? Related to acute infectious illness. Follow CBC in a.m. 7. Essential  hypertension: Controlled. Currently not on antihypertensives. 8. Chronic back pain   DVT prophylaxis: Lovenox Code Status: Full Family Communication: None at bedside Disposition: DC home when medically stable, possibly in 48 hours.   Consultants:  None   Procedures:  None  Antimicrobials:  IV ceftriaxone and azithromycin    Subjective: Spanish-speaking student nurse at bedside assisted with interpretation. Patient stated that she feels better. Mild dry cough. No chest pain or dyspnea. Chronic back pain.  ROS: Does not currently smoke. States that she lives with her 41 year old son at home and is independent of activities of daily living. Goes to pharmacy for prescription refills even though she does not have a physician.  Objective:  Vitals:   01/15/17 0939 01/15/17 0944 01/15/17 1000 01/15/17 1236  BP:   100/63   Pulse: 82 85 84   Resp:      Temp:   98.4 F (36.9 C) (!) 101.1 F (38.4 C)  TempSrc:   Oral Oral  SpO2: 97% 93% 95%   Weight:      Height:        Examination:  General exam: [name] sitting up comfortably in chair this morning. Does not look septic or toxic. Respiratory system: Slightly harsh breath sounds in the bases with few basal crackles. Rest of lung fields clear to auscultation. Respiratory effort normal. Cardiovascular system: S1 & S2 heard, RRR. No JVD, murmurs, rubs, gallops or clicks. No pedal edema. Telemetry: Sinus rhythm. Gastrointestinal system: Abdomen is nondistended, soft and nontender. No organomegaly or masses felt. Normal bowel sounds heard. Central nervous system: Alert and oriented. No focal neurological deficits. Extremities: Symmetric 5 x 5 power. Skin: No rashes, lesions or ulcers Psychiatry: Judgement and insight appear normal. Mood & affect appropriate.  Data Reviewed: I have personally reviewed following labs and imaging studies  CBC:  Recent Labs Lab 01/14/17 2042 01/15/17 0346  WBC 5.1 3.9*    NEUTROABS 3.2  --   HGB 14.9 12.6  HCT 43.9 38.0  MCV 84.7 85.6  PLT 153 562*   Basic Metabolic Panel:  Recent Labs Lab 01/14/17 2042 01/15/17 0346  NA 127* 135  K 3.7 3.6  CL 96* 102  CO2 19* 23  GLUCOSE 258* 323*  BUN 11 11  CREATININE 0.57 0.63  CALCIUM 8.7* 8.2*   Liver Function Tests:  Recent Labs Lab 01/14/17 2042  AST 29  ALT 23  ALKPHOS 164*  BILITOT 0.3  PROT 8.0  ALBUMIN 3.4*   Coagulation Profile:  Recent Labs Lab 01/14/17 2042  INR 0.98   HbA1C: No results for input(s): HGBA1C in the last 72 hours. CBG:  Recent Labs Lab 01/15/17 0854 01/15/17 1208  GLUCAP 250* 297*    Recent Results (from the past 240 hour(s))  Blood culture (routine x 2)     Status: None (Preliminary result)   Collection Time: 01/14/17  8:27 PM  Result Value Ref Range Status   Specimen Description BLOOD RIGHT ARM  Final   Special Requests   Final    BOTTLES DRAWN AEROBIC AND ANAEROBIC Blood Culture adequate volume   Culture NO GROWTH < 12 HOURS  Final   Report Status PENDING  Incomplete  Blood culture (routine x 2)     Status: None (Preliminary result)   Collection Time: 01/14/17  8:35 PM  Result Value Ref Range Status   Specimen Description BLOOD LEFT ARM  Final   Special Requests IN PEDIATRIC BOTTLE Blood Culture adequate volume  Final   Culture NO GROWTH < 12 HOURS  Final   Report Status PENDING  Incomplete         Radiology Studies: Dg Chest 2 View  Result Date: 01/14/2017 CLINICAL DATA:  Fever, lethargy and back pain tonight. EXAM: CHEST  2 VIEW COMPARISON:  06/27/2014 FINDINGS: There is confluent consolidation of the left lung base. Patchy opacity in the central right lung may represent an additional area of airspace consolidation. No pleural effusion. Normal pulmonary vasculature. Hilar, mediastinal and cardiac contours are unremarkable and unchanged. IMPRESSION: Consolidation in the left base and in the central right lung. This may represent pneumonia.  Followup PA and lateral chest X-ray is recommended in 3-4 weeks following trial of antibiotic therapy to ensure resolution and exclude underlying malignancy. Electronically Signed   By: Andreas Newport M.D.   On: 01/14/2017 21:38        Scheduled Meds: . enoxaparin (LOVENOX) injection  40 mg Subcutaneous Q24H  . insulin aspart  0-9 Units Subcutaneous TID WC  . insulin glargine  5 Units Subcutaneous Daily   Continuous Infusions: . sodium chloride 125 mL/hr at 01/15/17 1017  . azithromycin    . cefTRIAXone (ROCEPHIN)  IV       LOS: 1 day     Chancey Ringel, MD, FACP, FHM. [facility] Pager [phone]  If 7PM-7AM, please contact night-coverage [email or web address] Password [email or web address] 01/15/2017, 12:52 PM

## 2017-01-15 NOTE — Progress Notes (Signed)
Patient is having a temp of 102.3, b/p of 91/66, and O2 of 89. 2L of O2 has been added. MD has been paged. Awaiting new orders.

## 2017-01-15 NOTE — Progress Notes (Signed)
Results for Denise Mosley, Denise Mosley (MRN [id number]) as of 01/15/2017 12:07  Ref. Range 08/21/2016 02:38 08/21/2016 02:59 01/14/2017 20:42 01/15/2017 03:46  Glucose Latest Ref Range: 65 - 99 mg/dL 409 (H) 811 (H) 914 (H) 323 (H)  Noted that blood sugars continue to be greater than 180 mg/dl.  Recommend increasing Lantus to 15 units daily and may need to increase Novolog correction scale to MODERATE TID & HS. Will continue to monitor blood sugars while in the hospital.   Smith Mince RN BSN CDE Diabetes Coordinator Pager: [phone]  8am-5pm

## 2017-01-16 ENCOUNTER — Inpatient Hospital Stay (HOSPITAL_COMMUNITY): Payer: Medicaid Other

## 2017-01-16 DIAGNOSIS — Z9049 Acquired absence of other specified parts of digestive tract: Secondary | ICD-10-CM

## 2017-01-16 DIAGNOSIS — Z794 Long term (current) use of insulin: Secondary | ICD-10-CM

## 2017-01-16 DIAGNOSIS — J181 Lobar pneumonia, unspecified organism: Secondary | ICD-10-CM

## 2017-01-16 DIAGNOSIS — R8271 Bacteriuria: Secondary | ICD-10-CM

## 2017-01-16 DIAGNOSIS — J1108 Influenza due to unidentified influenza virus with specified pneumonia: Secondary | ICD-10-CM

## 2017-01-16 DIAGNOSIS — J101 Influenza due to other identified influenza virus with other respiratory manifestations: Secondary | ICD-10-CM

## 2017-01-16 LAB — GLUCOSE, CAPILLARY
GLUCOSE-CAPILLARY: 213 mg/dL — AB (ref 65–99)
GLUCOSE-CAPILLARY: 253 mg/dL — AB (ref 65–99)
GLUCOSE-CAPILLARY: 288 mg/dL — AB (ref 65–99)
Glucose-Capillary: 208 mg/dL — ABNORMAL HIGH (ref 65–99)
Glucose-Capillary: 174 mg/dL — ABNORMAL HIGH (ref 65–99)

## 2017-01-16 LAB — HEMOGLOBIN A1C
HEMOGLOBIN A1C: 12.3 % — AB (ref 4.8–5.6)
MEAN PLASMA GLUCOSE: 306 mg/dL

## 2017-01-16 LAB — LEGIONELLA PNEUMOPHILA SEROGP 1 UR AG: L. PNEUMOPHILA SEROGP 1 UR AG: NEGATIVE

## 2017-01-16 LAB — EXPECTORATED SPUTUM ASSESSMENT W GRAM STAIN, RFLX TO RESP C

## 2017-01-16 LAB — CBC
HCT: 37 % (ref 36.0–46.0)
Hemoglobin: 12.3 g/dL (ref 12.0–15.0)
MCH: 28.5 pg (ref 26.0–34.0)
MCHC: 33.2 g/dL (ref 30.0–36.0)
MCV: 85.6 fL (ref 78.0–100.0)
PLATELETS: 140 10*3/uL — AB (ref 150–400)
RBC: 4.32 MIL/uL (ref 3.87–5.11)
RDW: 12.6 % (ref 11.5–15.5)
WBC: 5.2 10*3/uL (ref 4.0–10.5)

## 2017-01-16 MED ORDER — INSULIN ASPART 100 UNIT/ML ~~LOC~~ SOLN
0.0000 [IU] | Freq: Three times a day (TID) | SUBCUTANEOUS | Status: DC
  Administered 2017-01-17 (×2): 3 [IU] via SUBCUTANEOUS
  Administered 2017-01-17: 8 [IU] via SUBCUTANEOUS
  Administered 2017-01-18: 5 [IU] via SUBCUTANEOUS
  Administered 2017-01-18: 8 [IU] via SUBCUTANEOUS
  Administered 2017-01-18 – 2017-01-19 (×2): 3 [IU] via SUBCUTANEOUS
  Administered 2017-01-19 – 2017-01-20 (×2): 2 [IU] via SUBCUTANEOUS
  Administered 2017-01-20: 5 [IU] via SUBCUTANEOUS
  Administered 2017-01-20: 3 [IU] via SUBCUTANEOUS
  Administered 2017-01-21: 2 [IU] via SUBCUTANEOUS
  Administered 2017-01-21 – 2017-01-23 (×6): 3 [IU] via SUBCUTANEOUS

## 2017-01-16 MED ORDER — SODIUM CHLORIDE 0.9 % IV SOLN
INTRAVENOUS | Status: DC
  Administered 2017-01-16: 13:00:00 via INTRAVENOUS

## 2017-01-16 MED ORDER — LIVING WELL WITH DIABETES BOOK - IN SPANISH
Freq: Once | Status: AC
  Administered 2017-01-16: 14:00:00
  Filled 2017-01-16: qty 1

## 2017-01-16 MED ORDER — INSULIN ASPART 100 UNIT/ML ~~LOC~~ SOLN
0.0000 [IU] | Freq: Every day | SUBCUTANEOUS | Status: DC
  Administered 2017-01-16 – 2017-01-17 (×2): 2 [IU] via SUBCUTANEOUS

## 2017-01-16 MED ORDER — SODIUM CHLORIDE 0.9 % IV SOLN
INTRAVENOUS | Status: DC
  Administered 2017-01-16 – 2017-01-17 (×2): via INTRAVENOUS

## 2017-01-16 MED ORDER — INSULIN GLARGINE 100 UNIT/ML ~~LOC~~ SOLN
15.0000 [IU] | Freq: Every day | SUBCUTANEOUS | Status: DC
  Administered 2017-01-17 – 2017-01-22 (×6): 15 [IU] via SUBCUTANEOUS
  Filled 2017-01-16 (×6): qty 0.15

## 2017-01-16 MED ORDER — INSULIN GLARGINE 100 UNIT/ML ~~LOC~~ SOLN
10.0000 [IU] | Freq: Once | SUBCUTANEOUS | Status: AC
  Administered 2017-01-16: 10 [IU] via SUBCUTANEOUS
  Filled 2017-01-16 (×2): qty 0.1

## 2017-01-16 NOTE — Consult Note (Addendum)
  [facility]  Date of Admission:  01/14/2017  Date of Consult:  01/16/2017  Reason for Consult: Pneumonia, Fever Referring Physician: Hongalgi  Impression/Recommendation Influenza Pneumonia  UCx+ (0-5 WBC/RBC) DM2, uncontrolled  Would Continue her ceftriaxone and tamiflu Stop azithro If further fever, consider CT chest.  If further fever, consider TB w/u?  Await her BCx and UCx (seems unlikely given her UA) She needs improved control of her DM  Comment- Persistent fever would fit her diagnosis of influenza.  This can be complicated by super-infection with staph. If she does not improve, would investigate for that.   Thank you so much for this interesting consult,   Jeffrey Hatcher (pager) [phone] [email or web address]  Denise Mosley is an 55 y.o. female.  HPI: 55 yo [place] (in [place] 6 years) F with hx of DM2 (~ 20 years), adm on 5-2 with 3 days of cough and SOB. In ED she had WBC 5.2, CXR showing: Multilobar pneumonia/bronchopneumonia on the left, and probable early involvement also in the right lung. And temp of 102.1 She has been persistently febrile in hospital.  She was started on ceftriaxone/azithro/tamiflu.   Past Medical History:  Diagnosis Date  . Diabetes mellitus without complication (HCC)   . Hypertension     Past Surgical History:  Procedure Laterality Date  . CESAREAN SECTION    . CHOLECYSTECTOMY       No Known Allergies  Medications:  Scheduled: . enoxaparin (LOVENOX) injection  40 mg Subcutaneous Q24H  . insulin aspart  0-9 Units Subcutaneous TID WC  . insulin glargine  5 Units Subcutaneous Daily  . oseltamivir  75 mg Oral BID    Abtx:  Anti-infectives    Start     Dose/Rate Route Frequency Ordered Stop   01/15/17 2230  oseltamivir (TAMIFLU) capsule 75 mg     75 mg Oral 2 times daily 01/15/17 2201 01/20/17 2159   01/15/17 2200  cefTRIAXone (ROCEPHIN) 1 g in dextrose 5 % 50 mL IVPB     1 g 100  mL/hr over 30 Minutes Intravenous Every 24 hours 01/15/17 0314 01/21/17 2159   01/15/17 2000  azithromycin (ZITHROMAX) 500 mg in dextrose 5 % 250 mL IVPB     500 mg 250 mL/hr over 60 Minutes Intravenous Every 24 hours 01/15/17 0314 01/21/17 1959   01/14/17 2215  cefTRIAXone (ROCEPHIN) 1 g in dextrose 5 % 50 mL IVPB     1 g 100 mL/hr over 30 Minutes Intravenous  Once 01/14/17 2212 01/14/17 2342   01/14/17 2215  azithromycin (ZITHROMAX) tablet 500 mg     500 mg Oral  Once 01/14/17 2212 01/14/17 2312      Total days of antibiotics: 1 ceftriaxone/azithro/tamiflu          Social History:  reports that she has quit smoking. She has never used smokeless tobacco. She reports that she does not drink alcohol or use drugs.  Family History  Problem Relation Age of Onset  . Diabetes Mother   . Diabetes Father     General ROS: no changes in vision. no ophtho in last year. decreased apetite. does not know A1C, peuritic pain. +cough, normal BM and urine. +neuropathy.  Please see HPI. 12 point ROS o/w (-)  Blood pressure (!) 102/57, pulse 85, temperature 99.1 F (37.3 C), temperature source Oral, resp. rate 16, height 5' (1.524 m), weight 72.4 kg (159 lb 11.2 oz), SpO2 98 %. General appearance: alert, cooperative and no distress Eyes: negative   findings: conjunctivae and sclerae normal and pupils equal, round, reactive to light and accomodation Throat: normal findings: oropharynx pink & moist without lesions or evidence of thrush Neck: no adenopathy and supple, symmetrical, trachea midline Lungs: diminished breath sounds anterior - bilateral Heart: regular rate and rhythm Abdomen: normal findings: bowel sounds normal and soft, non-tender Extremities: edema none and no diabetic foot lesions. grosly normal light touch BLE.    Results for orders placed or performed during the hospital encounter of 01/14/17 (from the past 48 hour(s))  Blood culture (routine x 2)     Status: None (Preliminary result)    Collection Time: 01/14/17  8:27 PM  Result Value Ref Range   Specimen Description BLOOD RIGHT ARM    Special Requests      BOTTLES DRAWN AEROBIC AND ANAEROBIC Blood Culture adequate volume   Culture NO GROWTH 2 DAYS    Report Status PENDING   Urinalysis, Routine w reflex microscopic     Status: Abnormal   Collection Time: 01/14/17  8:33 PM  Result Value Ref Range   Color, Urine AMBER (A) YELLOW    Comment: BIOCHEMICALS MAY BE AFFECTED BY COLOR   APPearance CLEAR CLEAR   Specific Gravity, Urine 1.028 1.005 - 1.030   pH 5.0 5.0 - 8.0   Glucose, UA >=500 (A) NEGATIVE mg/dL   Hgb urine dipstick NEGATIVE NEGATIVE   Bilirubin Urine NEGATIVE NEGATIVE   Ketones, ur 80 (A) NEGATIVE mg/dL   Protein, ur 100 (A) NEGATIVE mg/dL   Nitrite POSITIVE (A) NEGATIVE   Leukocytes, UA NEGATIVE NEGATIVE   RBC / HPF 0-5 0 - 5 RBC/hpf   WBC, UA 0-5 0 - 5 WBC/hpf   Bacteria, UA FEW (A) NONE SEEN   Squamous Epithelial / LPF 0-5 (A) NONE SEEN   Mucous PRESENT    Hyaline Casts, UA PRESENT   Blood culture (routine x 2)     Status: None (Preliminary result)   Collection Time: 01/14/17  8:35 PM  Result Value Ref Range   Specimen Description BLOOD LEFT ARM    Special Requests IN PEDIATRIC BOTTLE Blood Culture adequate volume    Culture NO GROWTH 2 DAYS    Report Status PENDING   Comprehensive metabolic panel     Status: Abnormal   Collection Time: 01/14/17  8:42 PM  Result Value Ref Range   Sodium 127 (L) 135 - 145 mmol/L   Potassium 3.7 3.5 - 5.1 mmol/L   Chloride 96 (L) 101 - 111 mmol/L   CO2 19 (L) 22 - 32 mmol/L   Glucose, Bld 258 (H) 65 - 99 mg/dL   BUN 11 6 - 20 mg/dL   Creatinine, Ser 0.57 0.44 - 1.00 mg/dL   Calcium 8.7 (L) 8.9 - 10.3 mg/dL   Total Protein 8.0 6.5 - 8.1 g/dL   Albumin 3.4 (L) 3.5 - 5.0 g/dL   AST 29 15 - 41 U/L   ALT 23 14 - 54 U/L   Alkaline Phosphatase 164 (H) 38 - 126 U/L   Total Bilirubin 0.3 0.3 - 1.2 mg/dL   GFR calc non Af Amer >60 >60 mL/min   GFR calc Af Amer  >60 >60 mL/min    Comment: (NOTE) The eGFR has been calculated using the CKD EPI equation. This calculation has not been validated in all clinical situations. eGFR's persistently <60 mL/min signify possible Chronic Kidney Disease.    Anion gap 12 5 - 15  CBC with Differential     Status: Abnormal     Collection Time: 01/14/17  8:42 PM  Result Value Ref Range   WBC 5.1 4.0 - 10.5 K/uL   RBC 5.18 (H) 3.87 - 5.11 MIL/uL   Hemoglobin 14.9 12.0 - 15.0 g/dL   HCT 43.9 36.0 - 46.0 %   MCV 84.7 78.0 - 100.0 fL   MCH 28.8 26.0 - 34.0 pg   MCHC 33.9 30.0 - 36.0 g/dL   RDW 12.7 11.5 - 15.5 %   Platelets 153 150 - 400 K/uL   Neutrophils Relative % 63 %   Neutro Abs 3.2 1.7 - 7.7 K/uL   Lymphocytes Relative 31 %   Lymphs Abs 1.6 0.7 - 4.0 K/uL   Monocytes Relative 6 %   Monocytes Absolute 0.3 0.1 - 1.0 K/uL   Eosinophils Relative 0 %   Eosinophils Absolute 0.0 0.0 - 0.7 K/uL   Basophils Relative 0 %   Basophils Absolute 0.0 0.0 - 0.1 K/uL  Protime-INR     Status: None   Collection Time: 01/14/17  8:42 PM  Result Value Ref Range   Prothrombin Time 13.0 11.4 - 15.2 seconds   INR 0.98   Lipase, blood     Status: None   Collection Time: 01/14/17  8:42 PM  Result Value Ref Range   Lipase 18 11 - 51 U/L  I-Stat CG4 Lactic Acid, ED     Status: None   Collection Time: 01/14/17  9:04 PM  Result Value Ref Range   Lactic Acid, Venous 1.41 0.5 - 1.9 mmol/L  Urine culture     Status: Abnormal (Preliminary result)   Collection Time: 01/14/17 10:50 PM  Result Value Ref Range   Specimen Description URINE, CLEAN CATCH    Special Requests NONE    Culture >=100,000 COLONIES/mL GRAM NEGATIVE RODS (A)    Report Status PENDING   Influenza panel by PCR (type A & B)     Status: Abnormal   Collection Time: 01/14/17 11:13 PM  Result Value Ref Range   Influenza A By PCR POSITIVE (A) NEGATIVE   Influenza B By PCR NEGATIVE NEGATIVE    Comment: (NOTE) The Xpert Xpress Flu assay is intended as an aid in  the diagnosis of  influenza and should not be used as a sole basis for treatment.  This  assay is FDA approved for nasopharyngeal swab specimens only. Nasal  washings and aspirates are unacceptable for Xpert Xpress Flu testing.   HIV antibody (Routine Testing)     Status: None   Collection Time: 01/15/17  3:46 AM  Result Value Ref Range   HIV Screen 4th Generation wRfx Non Reactive Non Reactive    Comment: (NOTE) Performed At: [facility] [address] [place], [place] [id number] [place] William F MD Ph:[phone]   Basic metabolic panel     Status: Abnormal   Collection Time: 01/15/17  3:46 AM  Result Value Ref Range   Sodium 135 135 - 145 mmol/L    Comment: DELTA CHECK NOTED   Potassium 3.6 3.5 - 5.1 mmol/L   Chloride 102 101 - 111 mmol/L   CO2 23 22 - 32 mmol/L   Glucose, Bld 323 (H) 65 - 99 mg/dL   BUN 11 6 - 20 mg/dL   Creatinine, Ser 0.63 0.44 - 1.00 mg/dL   Calcium 8.2 (L) 8.9 - 10.3 mg/dL   GFR calc non Af Amer >60 >60 mL/min   GFR calc Af Amer >60 >60 mL/min    Comment: (NOTE) The eGFR has been calculated using   the CKD EPI equation. This calculation has not been validated in all clinical situations. eGFR's persistently <60 mL/min signify possible Chronic Kidney Disease.    Anion gap 10 5 - 15  CBC     Status: Abnormal   Collection Time: 01/15/17  3:46 AM  Result Value Ref Range   WBC 3.9 (L) 4.0 - 10.5 K/uL   RBC 4.44 3.87 - 5.11 MIL/uL   Hemoglobin 12.6 12.0 - 15.0 g/dL   HCT 38.0 36.0 - 46.0 %   MCV 85.6 78.0 - 100.0 fL   MCH 28.4 26.0 - 34.0 pg   MCHC 33.2 30.0 - 36.0 g/dL   RDW 12.6 11.5 - 15.5 %   Platelets 141 (L) 150 - 400 K/uL  Hemoglobin A1c     Status: Abnormal   Collection Time: 01/15/17  6:38 AM  Result Value Ref Range   Hgb A1c MFr Bld 12.3 (H) 4.8 - 5.6 %    Comment: (NOTE)         Pre-diabetes: 5.7 - 6.4         Diabetes: >6.4         Glycemic control for adults with diabetes: <7.0    Mean Plasma Glucose 306 mg/dL    Comment:  (NOTE) Performed At: [facility] [address] [place], [place] [id number] Lindon Romp MD YK:[phone]   Glucose, capillary     Status: Abnormal   Collection Time: 01/15/17  8:54 AM  Result Value Ref Range   Glucose-Capillary 250 (H) 65 - 99 mg/dL  Glucose, capillary     Status: Abnormal   Collection Time: 01/15/17 12:08 PM  Result Value Ref Range   Glucose-Capillary 297 (H) 65 - 99 mg/dL  Glucose, capillary     Status: Abnormal   Collection Time: 01/15/17  6:05 PM  Result Value Ref Range   Glucose-Capillary 207 (H) 65 - 99 mg/dL  Procalcitonin - Baseline     Status: None   Collection Time: 01/15/17  6:13 PM  Result Value Ref Range   Procalcitonin 0.33 ng/mL    Comment:        Interpretation: PCT (Procalcitonin) <= 0.5 ng/mL: Systemic infection (sepsis) is not likely. Local bacterial infection is possible. (NOTE)         ICU PCT Algorithm               Non ICU PCT Algorithm    ----------------------------     ------------------------------         PCT < 0.25 ng/mL                 PCT < 0.1 ng/mL     Stopping of antibiotics            Stopping of antibiotics       strongly encouraged.               strongly encouraged.    ----------------------------     ------------------------------       PCT level decrease by               PCT < 0.25 ng/mL       >= 80% from peak PCT       OR PCT 0.25 - 0.5 ng/mL          Stopping of antibiotics  encouraged.     Stopping of antibiotics           encouraged.    ----------------------------     ------------------------------       PCT level decrease by              PCT >= 0.25 ng/mL       < 80% from peak PCT        AND PCT >= 0.5 ng/mL            Continuin g antibiotics                                              encouraged.       Continuing antibiotics            encouraged.    ----------------------------     ------------------------------     PCT level increase compared           PCT > 0.5 ng/mL         with peak PCT AND          PCT >= 0.5 ng/mL             Escalation of antibiotics                                          strongly encouraged.      Escalation of antibiotics        strongly encouraged.   Glucose, capillary     Status: Abnormal   Collection Time: 01/16/17 12:08 AM  Result Value Ref Range   Glucose-Capillary 253 (H) 65 - 99 mg/dL  CBC     Status: Abnormal   Collection Time: 01/16/17  3:05 AM  Result Value Ref Range   WBC 5.2 4.0 - 10.5 K/uL   RBC 4.32 3.87 - 5.11 MIL/uL   Hemoglobin 12.3 12.0 - 15.0 g/dL   HCT 37.0 36.0 - 46.0 %   MCV 85.6 78.0 - 100.0 fL   MCH 28.5 26.0 - 34.0 pg   MCHC 33.2 30.0 - 36.0 g/dL   RDW 12.6 11.5 - 15.5 %   Platelets 140 (L) 150 - 400 K/uL  Glucose, capillary     Status: Abnormal   Collection Time: 01/16/17  8:24 AM  Result Value Ref Range   Glucose-Capillary 208 (H) 65 - 99 mg/dL      Component Value Date/Time   SDES URINE, CLEAN CATCH 01/14/2017 2250   SPECREQUEST NONE 01/14/2017 2250   CULT >=100,000 COLONIES/mL GRAM NEGATIVE RODS (A) 01/14/2017 2250   REPTSTATUS PENDING 01/14/2017 2250   Dg Chest 2 View  Result Date: 01/16/2017 CLINICAL DATA:  55-year-old female with fever, pneumonia/ flu. EXAM: CHEST  2 VIEW COMPARISON:  01/14/2017 and earlier. FINDINGS: Confluent peribronchial opacity in the left lung with a lower lobe predominance, but opacity tracks peripherally probably into all 3 left side lobes. No pleural effusion. Early peribronchial opacity suspected about the hilum on the right. Right lung otherwise clear. No pneumothorax. Mediastinal contours are stable and within normal limits. Visualized tracheal air column is within normal limits. No acute osseous abnormality identified. Negative visible bowel gas pattern. IMPRESSION: Multilobar pneumonia/bronchopneumonia on the left, and probable early involvement also in the right lung. No pleural effusion.   Electronically Signed   By: H  Hall M.D.   On:  01/16/2017 12:07   Dg Chest 2 View  Result Date: 01/14/2017 CLINICAL DATA:  Fever, lethargy and back pain tonight. EXAM: CHEST  2 VIEW COMPARISON:  06/27/2014 FINDINGS: There is confluent consolidation of the left lung base. Patchy opacity in the central right lung may represent an additional area of airspace consolidation. No pleural effusion. Normal pulmonary vasculature. Hilar, mediastinal and cardiac contours are unremarkable and unchanged. IMPRESSION: Consolidation in the left base and in the central right lung. This may represent pneumonia. Followup PA and lateral chest X-ray is recommended in 3-4 weeks following trial of antibiotic therapy to ensure resolution and exclude underlying malignancy. Electronically Signed   By: Daniel R Mitchell M.D.   On: 01/14/2017 21:38   Recent Results (from the past 240 hour(s))  Blood culture (routine x 2)     Status: None (Preliminary result)   Collection Time: 01/14/17  8:27 PM  Result Value Ref Range Status   Specimen Description BLOOD RIGHT ARM  Final   Special Requests   Final    BOTTLES DRAWN AEROBIC AND ANAEROBIC Blood Culture adequate volume   Culture NO GROWTH 2 DAYS  Final   Report Status PENDING  Incomplete  Blood culture (routine x 2)     Status: None (Preliminary result)   Collection Time: 01/14/17  8:35 PM  Result Value Ref Range Status   Specimen Description BLOOD LEFT ARM  Final   Special Requests IN PEDIATRIC BOTTLE Blood Culture adequate volume  Final   Culture NO GROWTH 2 DAYS  Final   Report Status PENDING  Incomplete  Urine culture     Status: Abnormal (Preliminary result)   Collection Time: 01/14/17 10:50 PM  Result Value Ref Range Status   Specimen Description URINE, CLEAN CATCH  Final   Special Requests NONE  Final   Culture >=100,000 COLONIES/mL GRAM NEGATIVE RODS (A)  Final   Report Status PENDING  Incomplete      01/16/2017, 12:11 PM     LOS: 2 days    Records and images were personally reviewed where  available.  

## 2017-01-16 NOTE — Progress Notes (Signed)
   01/16/17 0840  Vitals  Temp (!) 103.1 F (39.5 C)  the above was pt temp. MD. Waymon AmatoHongalgi notified.

## 2017-01-16 NOTE — Progress Notes (Signed)
PROGRESS NOTE   Denise Mosley  HUT:[id number]    DOB: 1961-04-03    DOA: 01/14/2017  PCP: No PCP Per Patient   I have briefly reviewed patients previous medical [facility].  Brief Narrative:  [name], lives with adult son, does not have a PCP, IADL, PMH of type II DM, HTN, former smoker, chronic back pain, presented to ED with 3 days history of productive cough, dyspnea, weakness, poor appetite and weakness. In ED, fever of 103.1, tachycardic 113/m, transient hypoxia 87-88 percent, normal lactate, sodium 127 and chest x-ray suggestive of lobar pneumonia. Admitted for community-acquired pneumonia.Flu confirmed. ID consulted.   Assessment & Plan:   Principal Problem:   CAP (community acquired pneumonia) Active Problems:   Hyponatremia   Dehydration   Diabetes mellitus type 2, controlled ([place])   1. Community-acquired pneumonia: HIV antibody: Negative, flu panel PCR: Positive for influenza A, urine Legionella antigen: Negative. Urine pneumococcal antigen negative. Blood cultures 2: Negative to date. Treating empirically with IV ceftriaxone. Continue to spike high fevers hence ID consulted and indicated that ongoing fevers may be related to influenza. Azithromycin was discontinued. Will need follow-up chest x-ray in 4 weeks to ensure resolution of pneumonia findings. As per ID, if she continues to have further fever then consider CT chest, TB workup etc. 2. Influenza A: Tamiflu initiated. Continue antipyretics with Tylenol alternating with ibuprofen. Suspect fever may take a day or 2 to defervesce. 3. Sepsis secondary to pneumonia & influenza: Met sepsis criteria on admission. Sepsis physiology have improved. Antimicrobials as above. Continue gentle IV fluid hydration. 4. Acute respiratory failure with hypoxia: Transient hypoxia noted in ED, likely due to pneumonia and influenza. Personally reviewed chest x-ray from 5/3: Multi lobar  pneumonia/bronchopneumonia on the left and probable early involvement also in the right lung. Oxygen support and wean as tolerated. 5. Dehydration with hyponatremia: Sodium 127 on admission which may be a combination of dehydration and hyperglycemia. Dehydration clinically improved. Continue additional day of gentle hydration. Hyponatremia resolved. 6. Uncontrolled type II DM: Placed on Lantus 5 units daily and NovoLog SSI. A1c 12.3 suggesting poor outpatient control. Home metformin on hold. Needs close outpatient follow-up with a new PCP. Discussed with patient and she verbalizes understanding. Increase Lantus to 15 units daily and continue SSI. Monitor. 7. Anemia and thrombocytopenia:? Related to acute infectious illness. Leukopenia resolved. Thrombocytopenia stable. 8. Essential hypertension: Controlled. Currently not on antihypertensives. 9. Chronic back pain 10. Asymptomatic bacteriuria/Escherichia coli   DVT prophylaxis: Lovenox Code Status: Full Family Communication: None at bedside Disposition: DC home when medically stable, possibly in 48 hours.   Consultants:  Infectious disease  Procedures:  None  Antimicrobials:  IV ceftriaxone and azithromycin    Subjective: Spanish-speaking student nurse at bedside assisted with interpretation. Coughing more with intermittent yellow/white sputum, dyspnea, ongoing intermittent fevers. Denies chest pain. Stuffiness of nose.  ROS: Does not currently smoke. States that she lives with her 15 year old son at home and is independent of activities of daily living. Goes to pharmacy for prescription refills even though she does not have a physician.  Objective:  Vitals:   01/16/17 1247 01/16/17 1400 01/16/17 1600 01/16/17 1654  BP:  (!) 97/55    Pulse:  81    Resp:      Temp: 98.7 F (37.1 C) 99.5 F (37.5 C) (!) 100.8 F (38.2 C) (!) 100.4 F (38 C)  TempSrc: Oral Oral Oral Oral  SpO2:  92%    Weight:  Height:         Examination:  General exam: [name] sitting up comfortably in bed this morning. Does not look septic or toxic. Respiratory system: Slightly harsh breath sounds in the bases with few basal crackles. Rest of lung fields clear to auscultation. Respiratory effort normal. On oxygen via [place]. Cardiovascular system: S1 & S2 heard, RRR. No JVD, murmurs, rubs, gallops or clicks. No pedal edema. Gastrointestinal system: Abdomen is nondistended, soft and nontender. No organomegaly or masses felt. Normal bowel sounds heard. Central nervous system: Alert and oriented. No focal neurological deficits. Extremities: Symmetric 5 x 5 power. Skin: No rashes, lesions or ulcers Psychiatry: Judgement and insight appear normal. Mood & affect appropriate.     Data Reviewed: I have personally reviewed following labs and imaging studies  CBC:  Recent Labs Lab 01/14/17 2042 01/15/17 0346 01/16/17 0305  WBC 5.1 3.9* 5.2  NEUTROABS 3.2  --   --   HGB 14.9 12.6 12.3  HCT 43.9 38.0 37.0  MCV 84.7 85.6 85.6  PLT 153 141* 017*   Basic Metabolic Panel:  Recent Labs Lab 01/14/17 2042 01/15/17 0346  NA 127* 135  K 3.7 3.6  CL 96* 102  CO2 19* 23  GLUCOSE 258* 323*  BUN 11 11  CREATININE 0.57 0.63  CALCIUM 8.7* 8.2*   Liver Function Tests:  Recent Labs Lab 01/14/17 2042  AST 29  ALT 23  ALKPHOS 164*  BILITOT 0.3  PROT 8.0  ALBUMIN 3.4*   Coagulation Profile:  Recent Labs Lab 01/14/17 2042  INR 0.98   HbA1C:  Recent Labs  01/15/17 0638  HGBA1C 12.3*   CBG:  Recent Labs Lab 01/15/17 1208 01/15/17 1805 01/16/17 0008 01/16/17 0824 01/16/17 1246  GLUCAP 297* 207* 253* 208* 288*    Recent Results (from the past 240 hour(s))  Blood culture (routine x 2)     Status: None (Preliminary result)   Collection Time: 01/14/17  8:27 PM  Result Value Ref Range Status   Specimen Description BLOOD RIGHT ARM  Final   Special Requests   Final    BOTTLES DRAWN  AEROBIC AND ANAEROBIC Blood Culture adequate volume   Culture NO GROWTH 2 DAYS  Final   Report Status PENDING  Incomplete  Blood culture (routine x 2)     Status: None (Preliminary result)   Collection Time: 01/14/17  8:35 PM  Result Value Ref Range Status   Specimen Description BLOOD LEFT ARM  Final   Special Requests IN PEDIATRIC BOTTLE Blood Culture adequate volume  Final   Culture NO GROWTH 2 DAYS  Final   Report Status PENDING  Incomplete  Urine culture     Status: Abnormal (Preliminary result)   Collection Time: 01/14/17 10:50 PM  Result Value Ref Range Status   Specimen Description URINE, CLEAN CATCH  Final   Special Requests NONE  Final   Culture >=100,000 COLONIES/mL ESCHERICHIA COLI (A)  Final   Report Status PENDING  Incomplete  Culture, sputum-assessment     Status: None   Collection Time: 01/15/17  9:40 AM  Result Value Ref Range Status   Specimen Description EXPECTORATED SPUTUM  Final   Special Requests NONE  Final   Sputum evaluation   Final    Sputum specimen not acceptable for testing.  Please recollect.   Results Called to: Titus Dubin, RN AT [place] ON 01/16/17. Colin Rhein, MLT    Report Status 01/16/2017 FINAL  Final  Radiology Studies: Dg Chest 2 View  Result Date: 01/16/2017 CLINICAL DATA:  56 year old female with fever, pneumonia/ flu. EXAM: CHEST  2 VIEW COMPARISON:  01/14/2017 and earlier. FINDINGS: Confluent peribronchial opacity in the left lung with a lower lobe predominance, but opacity tracks peripherally probably into all 3 left side lobes. No pleural effusion. Early peribronchial opacity suspected about the hilum on the right. Right lung otherwise clear. No pneumothorax. Mediastinal contours are stable and within normal limits. Visualized tracheal air column is within normal limits. No acute osseous abnormality identified. Negative visible bowel gas pattern. IMPRESSION: Multilobar pneumonia/bronchopneumonia on the left, and probable early  involvement also in the right lung. No pleural effusion. Electronically Signed   By: Genevie Ann M.D.   On: 01/16/2017 12:07   Dg Chest 2 View  Result Date: 01/14/2017 CLINICAL DATA:  Fever, lethargy and back pain tonight. EXAM: CHEST  2 VIEW COMPARISON:  06/27/2014 FINDINGS: There is confluent consolidation of the left lung base. Patchy opacity in the central right lung may represent an additional area of airspace consolidation. No pleural effusion. Normal pulmonary vasculature. Hilar, mediastinal and cardiac contours are unremarkable and unchanged. IMPRESSION: Consolidation in the left base and in the central right lung. This may represent pneumonia. Followup PA and lateral chest X-ray is recommended in 3-4 weeks following trial of antibiotic therapy to ensure resolution and exclude underlying malignancy. Electronically Signed   By: Andreas Newport M.D.   On: 01/14/2017 21:38        Scheduled Meds: . enoxaparin (LOVENOX) injection  40 mg Subcutaneous Q24H  . insulin aspart  0-9 Units Subcutaneous TID WC  . insulin glargine  5 Units Subcutaneous Daily  . living well with diabetes book- in spanish   Does not apply Once  . oseltamivir  75 mg Oral BID   Continuous Infusions: . sodium chloride 125 mL/hr at 01/16/17 1249  . cefTRIAXone (ROCEPHIN)  IV 1 g (01/15/17 2119)     LOS: 2 days     Sherie Dobrowolski, MD, FACP, FHM. [facility] Pager [phone]  If 7PM-7AM, please contact night-coverage [email or web address] Password [email or web address] 01/16/2017, 5:19 PM

## 2017-01-16 NOTE — Progress Notes (Addendum)
Inpatient Diabetes Program Recommendations  AACE/ADA: New Consensus Statement on Inpatient Glycemic Control (2015)  Target Ranges:  Prepandial:   less than 140 mg/dL      Peak postprandial:   less than 180 mg/dL (1-2 hours)      Critically ill patients:  140 - 180 mg/dL   Lab Results  Component Value Date   GLUCAP 288 (H) 01/16/2017   HGBA1C 12.3 (H) 01/15/2017    Review of Glycemic Control Results for Denise Mosley, Denise Mosley (MRN [id number]) as of 01/16/2017 14:29  Ref. Range 01/15/2017 12:08 01/15/2017 18:05 01/16/2017 00:08 01/16/2017 08:24 01/16/2017 12:46  Glucose-Capillary Latest Ref Range: 65 - 99 mg/dL 829297 (H) 562207 (H) 130253 (H) 208 (H) 288 (H)   Noted that blood sugars continue to be greater than 180 mg/dl.  Recommend increasing Lantus to 15 units daily and may need to increase Novolog correction scale to MODERATE TID & HS. Text page sent to Dr. Waymon AmatoHongalgi. 3:20 Attempted to speak to pt. But pt sleeping, so gave Spanish written information regarding elevated A1c to family member @ bedside.  Thank you, Billy FischerJudy E. Daniyla Pfahler, RN, MSN, CDE  Diabetes Coordinator Inpatient Glycemic Control Team Team Pager [phone] (8am-5pm) 01/16/2017 2:29 PM

## 2017-01-16 NOTE — Progress Notes (Signed)
Pt. Experienced an extensive coughing spell with a moderate amount of pink tinged, thick, frothy sputum. Pt. States difficulty sleeping due to coughing spells. Pt. Is currently on 2L/min oxygen per nasal canula, O2 sat. Is 90-93%. Myrlene BrokerIreti Omoseebi, RN notified and will call MD for orders. Cleophas DunkerJennifer Sigmund Morera Student Nurse

## 2017-01-17 ENCOUNTER — Inpatient Hospital Stay: Payer: Self-pay

## 2017-01-17 LAB — CBC
HCT: 37.5 % (ref 36.0–46.0)
HEMOGLOBIN: 12.3 g/dL (ref 12.0–15.0)
MCH: 28.1 pg (ref 26.0–34.0)
MCHC: 32.8 g/dL (ref 30.0–36.0)
MCV: 85.8 fL (ref 78.0–100.0)
PLATELETS: 143 10*3/uL — AB (ref 150–400)
RBC: 4.37 MIL/uL (ref 3.87–5.11)
RDW: 13.2 % (ref 11.5–15.5)
WBC: 3.5 10*3/uL — ABNORMAL LOW (ref 4.0–10.5)

## 2017-01-17 LAB — URINE CULTURE

## 2017-01-17 LAB — GLUCOSE, CAPILLARY
GLUCOSE-CAPILLARY: 178 mg/dL — AB (ref 65–99)
GLUCOSE-CAPILLARY: 281 mg/dL — AB (ref 65–99)
Glucose-Capillary: 179 mg/dL — ABNORMAL HIGH (ref 65–99)
Glucose-Capillary: 212 mg/dL — ABNORMAL HIGH (ref 65–99)

## 2017-01-17 LAB — PROCALCITONIN: PROCALCITONIN: 0.9 ng/mL

## 2017-01-17 MED ORDER — BENZONATATE 100 MG PO CAPS
200.0000 mg | ORAL_CAPSULE | Freq: Three times a day (TID) | ORAL | Status: DC
  Administered 2017-01-17 – 2017-01-23 (×18): 200 mg via ORAL
  Filled 2017-01-17 (×18): qty 2

## 2017-01-17 MED ORDER — LEVOFLOXACIN 500 MG PO TABS
500.0000 mg | ORAL_TABLET | Freq: Every day | ORAL | Status: DC
  Administered 2017-01-18: 500 mg via ORAL
  Filled 2017-01-17: qty 1

## 2017-01-17 MED ORDER — INSULIN STARTER KIT- SYRINGES (ENGLISH)
1.0000 | Freq: Once | Status: AC
  Administered 2017-01-17: 1
  Filled 2017-01-17: qty 1

## 2017-01-17 MED ORDER — IBUPROFEN 400 MG PO TABS: 400.0000 mg | ORAL_TABLET | Freq: Three times a day (TID) | ORAL | Status: AC | PRN

## 2017-01-17 NOTE — Progress Notes (Signed)
INFECTIOUS DISEASE PROGRESS NOTE  ID: Denise Mosley is a 56 y.o. female with  Principal Problem:   CAP (community acquired pneumonia) Active Problems:   Hyponatremia   Dehydration   Diabetes mellitus type 2, controlled ([facility])  Subjective: No cough, no sob.   Abtx:  Anti-infectives    Start     Dose/Rate Route Frequency Ordered Stop   01/15/17 2230  oseltamivir (TAMIFLU) capsule 75 mg     75 mg Oral 2 times daily 01/15/17 2201 01/20/17 2159   01/15/17 2200  cefTRIAXone (ROCEPHIN) 1 g in dextrose 5 % 50 mL IVPB     1 g 100 mL/hr over 30 Minutes Intravenous Every 24 hours 01/15/17 0314 01/21/17 2159   01/15/17 2000  azithromycin (ZITHROMAX) 500 mg in dextrose 5 % 250 mL IVPB  Status:  Discontinued     500 mg 250 mL/hr over 60 Minutes Intravenous Every 24 hours 01/15/17 0314 01/16/17 1341   01/14/17 2215  cefTRIAXone (ROCEPHIN) 1 g in dextrose 5 % 50 mL IVPB     1 g 100 mL/hr over 30 Minutes Intravenous  Once 01/14/17 2212 01/14/17 2342   01/14/17 2215  azithromycin (ZITHROMAX) tablet 500 mg     500 mg Oral  Once 01/14/17 2212 01/14/17 2312      Medications:  Scheduled: . enoxaparin (LOVENOX) injection  40 mg Subcutaneous Q24H  . insulin aspart  0-15 Units Subcutaneous TID WC  . insulin aspart  0-5 Units Subcutaneous QHS  . insulin glargine  15 Units Subcutaneous QHS  . oseltamivir  75 mg Oral BID    Objective: Vital signs in last 24 hours: Temp:  [98.2 F (36.8 C)-101 F (38.3 C)] 99.8 F (37.7 C) (05/04 0537) Pulse Rate:  [73-94] 94 (05/04 0537) Resp:  [16-20] 20 (05/04 0537) BP: (91-98)/(49-55) 93/51 (05/04 0537) SpO2:  [91 %-98 %] 94 % (05/04 0537)   General appearance: alert, cooperative and no distress Resp: clear to auscultation bilaterally Cardio: regular rate and rhythm GI: normal findings: bowel sounds normal and soft, non-tender Extremities: edema none  Lab Results  Recent Labs  01/14/17 2042 01/15/17 0346 01/16/17 0305 01/17/17 0438    WBC 5.1 3.9* 5.2 3.5*  HGB 14.9 12.6 12.3 12.3  HCT 43.9 38.0 37.0 37.5  NA 127* 135  --   --   K 3.7 3.6  --   --   CL 96* 102  --   --   CO2 19* 23  --   --   BUN 11 11  --   --   CREATININE 0.57 0.63  --   --    Liver Panel  Recent Labs  01/14/17 2042  PROT 8.0  ALBUMIN 3.4*  AST 29  ALT 23  ALKPHOS 164*  BILITOT 0.3   Sedimentation Rate No results for input(s): ESRSEDRATE in the last 72 hours. C-Reactive Protein No results for input(s): CRP in the last 72 hours.  Microbiology: Recent Results (from the past 240 hour(s))  Blood culture (routine x 2)     Status: None (Preliminary result)   Collection Time: 01/14/17  8:27 PM  Result Value Ref Range Status   Specimen Description BLOOD RIGHT ARM  Final   Special Requests   Final    BOTTLES DRAWN AEROBIC AND ANAEROBIC Blood Culture adequate volume   Culture NO GROWTH 2 DAYS  Final   Report Status PENDING  Incomplete  Blood culture (routine x 2)     Status: None (Preliminary result)  Collection Time: 01/14/17  8:35 PM  Result Value Ref Range Status   Specimen Description BLOOD LEFT ARM  Final   Special Requests IN PEDIATRIC BOTTLE Blood Culture adequate volume  Final   Culture NO GROWTH 2 DAYS  Final   Report Status PENDING  Incomplete  Urine culture     Status: Abnormal   Collection Time: 01/14/17 10:50 PM  Result Value Ref Range Status   Specimen Description URINE, CLEAN CATCH  Final   Special Requests NONE  Final   Culture >=100,000 COLONIES/mL ESCHERICHIA COLI (A)  Final   Report Status 01/17/2017 FINAL  Final   Organism ID, Bacteria ESCHERICHIA COLI (A)  Final      Susceptibility   Escherichia coli - MIC*    AMPICILLIN >=32 RESISTANT Resistant     CEFAZOLIN <=4 SENSITIVE Sensitive     CEFTRIAXONE <=1 SENSITIVE Sensitive     CIPROFLOXACIN <=0.25 SENSITIVE Sensitive     GENTAMICIN >=16 RESISTANT Resistant     IMIPENEM 1 SENSITIVE Sensitive     NITROFURANTOIN <=16 SENSITIVE Sensitive     TRIMETH/SULFA  <=20 SENSITIVE Sensitive     AMPICILLIN/SULBACTAM 16 INTERMEDIATE Intermediate     PIP/TAZO <=4 SENSITIVE Sensitive     Extended ESBL NEGATIVE Sensitive     * >=100,000 COLONIES/mL ESCHERICHIA COLI  Culture, sputum-assessment     Status: None   Collection Time: 01/15/17  9:40 AM  Result Value Ref Range Status   Specimen Description EXPECTORATED SPUTUM  Final   Special Requests NONE  Final   Sputum evaluation   Final    Sputum specimen not acceptable for testing.  Please recollect.   Results Called to: Sharmon RevereI. OMOSEBEN, RN AT 1535 ON 01/16/17. Zachery Conch. JESSUP, MLT    Report Status 01/16/2017 FINAL  Final  Culture, expectorated sputum-assessment     Status: None   Collection Time: 01/16/17  5:43 PM  Result Value Ref Range Status   Specimen Description EXPECTORATED SPUTUM  Final   Special Requests NONE  Final   Sputum evaluation THIS SPECIMEN IS ACCEPTABLE FOR SPUTUM CULTURE  Final   Report Status 01/16/2017 FINAL  Final  Culture, respiratory (NON-Expectorated)     Status: None (Preliminary result)   Collection Time: 01/16/17  5:43 PM  Result Value Ref Range Status   Specimen Description EXPECTORATED SPUTUM  Final   Special Requests NONE Reflexed from [id number]  Final   Gram Stain   Final    ABUNDANT WBC PRESENT,BOTH PMN AND MONONUCLEAR RARE GRAM POSITIVE COCCI IN PAIRS RARE GRAM NEGATIVE RODS    Culture PENDING  Incomplete   Report Status PENDING  Incomplete    Studies/Results: Dg Chest 2 View  Result Date: 01/16/2017 CLINICAL DATA:  56 year old female with fever, pneumonia/ flu. EXAM: CHEST  2 VIEW COMPARISON:  01/14/2017 and earlier. FINDINGS: Confluent peribronchial opacity in the left lung with a lower lobe predominance, but opacity tracks peripherally probably into all 3 left side lobes. No pleural effusion. Early peribronchial opacity suspected about the hilum on the right. Right lung otherwise clear. No pneumothorax. Mediastinal contours are stable and within normal limits. Visualized  tracheal air column is within normal limits. No acute osseous abnormality identified. Negative visible bowel gas pattern. IMPRESSION: Multilobar pneumonia/bronchopneumonia on the left, and probable early involvement also in the right lung. No pleural effusion. Electronically Signed   By: Odessa FlemingH  Hall M.D.   On: 01/16/2017 12:07     Assessment/Plan: Pneumonia Influenza DM2, uncontrolled  Would change her to levaquin, tamiflu 5  days of tamiflu total 3 more days of levaquin FSG better She appears to be improving.   Available as needed.    Total days of antibiotics: 2 ceftriaxone/tamiflu         Johny Sax Infectious Diseases (pager) [phone] [email or web address] 01/17/2017, 11:06 AM  LOS: 3 days

## 2017-01-17 NOTE — Progress Notes (Signed)
Patient complaining of 10/10 lower back pain while half asleep in the bed. Paged Kirby,NP. No new orders at this time. Patient states that she will take PRN tylenol at this time. Will continue to monitor and treat per MD orders.

## 2017-01-17 NOTE — Progress Notes (Signed)
PROGRESS NOTE   Denise Mosley  NVB:[id number]    DOB: 08/05/1961    DOA: 01/14/2017  PCP: No PCP Per Patient   I have briefly reviewed patients previous medical records in [facility].  Brief Narrative:  [name], lives with adult son, does not have a PCP, IADL, PMH of type II DM, HTN, former smoker, chronic back pain, presented to ED with 3 days history of productive cough, dyspnea, weakness, poor appetite and weakness. In ED, fever of 103.1, tachycardic 113/m, transient hypoxia 87-88 percent, normal lactate, sodium 127 and chest x-ray suggestive of lobar pneumonia. Admitted for community-acquired pneumonia.Flu confirmed. ID consulted.   Assessment & Plan:   Principal Problem:   CAP (community acquired pneumonia) Active Problems:   Hyponatremia   Dehydration   Diabetes mellitus type 2, controlled ([place])   1. Community-acquired pneumonia: HIV antibody: Negative, flu panel PCR: Positive for influenza A, urine Legionella antigen: Negative. Urine pneumococcal antigen negative. Blood cultures 2: Negative to date. Despite appropriate antibiotics, continued to spike high fevers hence ID consulted and indicated that ongoing fevers may be related to influenza. Completed 3 days of IV ceftriaxone and 2 days of azithromycin. ID has changed to oral levofloxacin on 5/4 to complete 3 more days. Will need follow-up chest x-ray in 4 weeks to ensure resolution of pneumonia findings. Defervesced and clinically improved. 2. Influenza A: Tamiflu initiated. Continue antipyretics with Tylenol alternating with ibuprofen. Improving. Complete 5 days of Tamiflu. 3. Sepsis secondary to pneumonia & influenza: Met sepsis criteria on admission. Sepsis physiology have improved. Antimicrobials as above. DC IV fluids. 4. Acute respiratory failure with hypoxia: Transient hypoxia noted in ED, likely due to pneumonia and influenza. Personally reviewed chest x-ray from 5/3: Multi lobar  pneumonia/bronchopneumonia on the left and probable early involvement also in the right lung. Oxygen support and wean as tolerated. 5. Dehydration with hyponatremia: Sodium 127 on admission which may be a combination of dehydration and hyperglycemia. Dehydration resolved. Hyponatremia resolved. 6. Uncontrolled type II DM: A1c 12.3 suggesting poor outpatient control. Home metformin on hold. Needs close outpatient follow-up with a new PCP. Discussed with patient and she verbalizes understanding. Lantus started and increased Lantus to 15 units daily and continue SSI. Better. Patient willing to do insulins at home. As discussed with case management, they will be able to assist with insulin at discharge. 7. Anemia and thrombocytopenia:? Related to acute infectious illness. Leukopenia resolved. Thrombocytopenia stable. 8. Essential hypertension: Controlled. Currently not on antihypertensives. 9. Chronic back pain:, worsened somewhat due to coughing spells. Treat supportively. 10. Asymptomatic bacteriuria/Escherichia coli: Sensitive anyway to ceftriaxone & levofloxacin   DVT prophylaxis: Lovenox Code Status: Full Family Communication: None at bedside Disposition: DC home when medically stable, possibly in 24 hours.   Consultants:  Infectious disease  Procedures:  None  Antimicrobials:  IV ceftriaxone and azithromycin -discontinue Tamiflu Oral levofloxacin 5/4 >   Subjective: Patient interviewed in detail using video interpreter. Patient's church friend at bedside. Overall feels better. Fevers and dyspnea improved but have not completely resolved. Bothered by hacking cough with intermittent pink colored sputum. Reports back pain for >10 years, mostly right upper and mid back, does not follow with physicians, takes when necessary Tylenol at home, worsened in the last couple of days with coughing.  ROS: Does not currently smoke. States that she lives with her 55 year old son at home and is  independent of activities of daily living. Goes to pharmacy for prescription refills even though she does not have a  physician.  Objective:  Vitals:   01/16/17 2057 01/16/17 2330 01/17/17 0200 01/17/17 0537  BP: (!) 91/49 (!) 92/52 (!) 98/55 (!) 93/51  Pulse: 82 73 78 94  Resp:  16  20  Temp: 99.2 F (37.3 C) 98.2 F (36.8 C) 99.1 F (37.3 C) 99.8 F (37.7 C)  TempSrc: Oral Oral Oral Oral  SpO2:  98% 91% 94%  Weight:      Height:        Examination:  General exam: [name] sitting up comfortably in bed this morning. Does not look septic or toxic. Respiratory system: Improved breath sounds. Few basal crackles but otherwise clear to auscultation. Respiratory effort normal. On oxygen via [place]. Cardiovascular system: S1 & S2 heard, RRR. No JVD, murmurs, rubs, gallops or clicks. No pedal edema. Gastrointestinal system: Abdomen is nondistended, soft and nontender. No organomegaly or masses felt. Normal bowel sounds heard. Central nervous system: Alert and oriented. No focal neurological deficits. Extremities: Symmetric 5 x 5 power. Skin: No rashes, lesions or ulcers Psychiatry: Judgement and insight appear normal. Mood & affect appropriate.     Data Reviewed: I have personally reviewed following labs and imaging studies  CBC:  Recent Labs Lab 01/14/17 2042 01/15/17 0346 01/16/17 0305 01/17/17 0438  WBC 5.1 3.9* 5.2 3.5*  NEUTROABS 3.2  --   --   --   HGB 14.9 12.6 12.3 12.3  HCT 43.9 38.0 37.0 37.5  MCV 84.7 85.6 85.6 85.8  PLT 153 141* 140* 619*   Basic Metabolic Panel:  Recent Labs Lab 01/14/17 2042 01/15/17 0346  NA 127* 135  K 3.7 3.6  CL 96* 102  CO2 19* 23  GLUCOSE 258* 323*  BUN 11 11  CREATININE 0.57 0.63  CALCIUM 8.7* 8.2*   Liver Function Tests:  Recent Labs Lab 01/14/17 2042  AST 29  ALT 23  ALKPHOS 164*  BILITOT 0.3  PROT 8.0  ALBUMIN 3.4*   Coagulation Profile:  Recent Labs Lab 01/14/17 2042  INR 0.98    HbA1C:  Recent Labs  01/15/17 0638  HGBA1C 12.3*   CBG:  Recent Labs Lab 01/16/17 1246 01/16/17 1751 01/16/17 2327 01/17/17 0803 01/17/17 1220  GLUCAP 288* 174* 213* 178* 281*    Recent Results (from the past 240 hour(s))  Blood culture (routine x 2)     Status: None (Preliminary result)   Collection Time: 01/14/17  8:27 PM  Result Value Ref Range Status   Specimen Description BLOOD RIGHT ARM  Final   Special Requests   Final    BOTTLES DRAWN AEROBIC AND ANAEROBIC Blood Culture adequate volume   Culture NO GROWTH 2 DAYS  Final   Report Status PENDING  Incomplete  Blood culture (routine x 2)     Status: None (Preliminary result)   Collection Time: 01/14/17  8:35 PM  Result Value Ref Range Status   Specimen Description BLOOD LEFT ARM  Final   Special Requests IN PEDIATRIC BOTTLE Blood Culture adequate volume  Final   Culture NO GROWTH 2 DAYS  Final   Report Status PENDING  Incomplete  Urine culture     Status: Abnormal   Collection Time: 01/14/17 10:50 PM  Result Value Ref Range Status   Specimen Description URINE, CLEAN CATCH  Final   Special Requests NONE  Final   Culture >=100,000 COLONIES/mL ESCHERICHIA COLI (A)  Final   Report Status 01/17/2017 FINAL  Final   Organism ID, Bacteria ESCHERICHIA COLI (A)  Final  Susceptibility   Escherichia coli - MIC*    AMPICILLIN >=32 RESISTANT Resistant     CEFAZOLIN <=4 SENSITIVE Sensitive     CEFTRIAXONE <=1 SENSITIVE Sensitive     CIPROFLOXACIN <=0.25 SENSITIVE Sensitive     GENTAMICIN >=16 RESISTANT Resistant     IMIPENEM 1 SENSITIVE Sensitive     NITROFURANTOIN <=16 SENSITIVE Sensitive     TRIMETH/SULFA <=20 SENSITIVE Sensitive     AMPICILLIN/SULBACTAM 16 INTERMEDIATE Intermediate     PIP/TAZO <=4 SENSITIVE Sensitive     Extended ESBL NEGATIVE Sensitive     * >=100,000 COLONIES/mL ESCHERICHIA COLI  Culture, sputum-assessment     Status: None   Collection Time: 01/15/17  9:40 AM  Result Value Ref Range Status    Specimen Description EXPECTORATED SPUTUM  Final   Special Requests NONE  Final   Sputum evaluation   Final    Sputum specimen not acceptable for testing.  Please recollect.   Results Called to: Titus Dubin, RN AT [facility] ON 01/16/17. Colin Rhein, MLT    Report Status 01/16/2017 FINAL  Final  Culture, expectorated sputum-assessment     Status: None   Collection Time: 01/16/17  5:43 PM  Result Value Ref Range Status   Specimen Description EXPECTORATED SPUTUM  Final   Special Requests NONE  Final   Sputum evaluation THIS SPECIMEN IS ACCEPTABLE FOR SPUTUM CULTURE  Final   Report Status 01/16/2017 FINAL  Final  Culture, respiratory (NON-Expectorated)     Status: None (Preliminary result)   Collection Time: 01/16/17  5:43 PM  Result Value Ref Range Status   Specimen Description EXPECTORATED SPUTUM  Final   Special Requests NONE Reflexed from [id number]  Final   Gram Stain   Final    ABUNDANT WBC PRESENT,BOTH PMN AND MONONUCLEAR RARE GRAM POSITIVE COCCI IN PAIRS RARE GRAM NEGATIVE RODS    Culture PENDING  Incomplete   Report Status PENDING  Incomplete         Radiology Studies: Dg Chest 2 View  Result Date: 01/16/2017 CLINICAL DATA:  56 year old female with fever, pneumonia/ flu. EXAM: CHEST  2 VIEW COMPARISON:  01/14/2017 and earlier. FINDINGS: Confluent peribronchial opacity in the left lung with a lower lobe predominance, but opacity tracks peripherally probably into all 3 left side lobes. No pleural effusion. Early peribronchial opacity suspected about the hilum on the right. Right lung otherwise clear. No pneumothorax. Mediastinal contours are stable and within normal limits. Visualized tracheal air column is within normal limits. No acute osseous abnormality identified. Negative visible bowel gas pattern. IMPRESSION: Multilobar pneumonia/bronchopneumonia on the left, and probable early involvement also in the right lung. No pleural effusion. Electronically Signed   By: Genevie Ann M.D.   On:  01/16/2017 12:07        Scheduled Meds: . benzonatate  200 mg Oral TID  . enoxaparin (LOVENOX) injection  40 mg Subcutaneous Q24H  . insulin aspart  0-15 Units Subcutaneous TID WC  . insulin aspart  0-5 Units Subcutaneous QHS  . insulin glargine  15 Units Subcutaneous QHS  . insulin starter kit- syringes  1 kit Other Once  . [START ON 01/18/2017] levofloxacin  500 mg Oral Daily  . oseltamivir  75 mg Oral BID   Continuous Infusions:    LOS: 3 days     HONGALGI,ANAND, MD, FACP, FHM. [facility] Pager [phone]  If 7PM-7AM, please contact night-coverage [email or web address] Password [email or web address] 01/17/2017, 1:32 PM

## 2017-01-17 NOTE — Progress Notes (Addendum)
Inpatient Diabetes Program Recommendations  AACE/ADA: New Consensus Statement on Inpatient Glycemic Control (2015)  Target Ranges:  Prepandial:   less than 140 mg/dL      Peak postprandial:   less than 180 mg/dL (1-2 hours)      Critically ill patients:  140 - 180 mg/dL   Lab Results  Component Value Date   GLUCAP 178 (H) 01/17/2017   HGBA1C 12.3 (H) 01/15/2017    Review of Glycemic Control Results for Denise Mosley, [place] (MRN [id number]) as of 01/17/2017 11:06  Ref. Range 01/16/2017 08:24 01/16/2017 12:46 01/16/2017 17:51 01/16/2017 23:27 01/17/2017 08:03  Glucose-Capillary Latest Ref Range: 65 - 99 mg/dL 782208 (H) 956288 (H) 213174 (H) 213 (H) 178 (H)   Diabetes history: DM2 Outpatient Diabetes medications: Metformin Current orders for Inpatient glycemic control: Lantus 15 units qd +Novolog correction 0-15 units tid + 0-5 units hs  Inpatient Diabetes Program Recommendations:  Noted patient does not have insurance. If patient does not have resources for Lantus and D/C on insulin, may change to 70/30 insulin from [facility] 7 units bid. Nurses, please start training patient to give injections and watch Spanish patient teaching videos. Attempted to speak with pt yesterday and today but was sleeping soundly each time.  Thank you, Billy FischerJudy E. Liberta Gimpel, RN, MSN, CDE  Diabetes Coordinator Inpatient Glycemic Control Team Team Pager [phone] (8am-5pm) 01/17/2017 11:12 AM

## 2017-01-18 ENCOUNTER — Inpatient Hospital Stay (HOSPITAL_COMMUNITY): Payer: Medicaid Other

## 2017-01-18 DIAGNOSIS — M546 Pain in thoracic spine: Secondary | ICD-10-CM

## 2017-01-18 DIAGNOSIS — G8929 Other chronic pain: Secondary | ICD-10-CM

## 2017-01-18 LAB — GLUCOSE, CAPILLARY
GLUCOSE-CAPILLARY: 224 mg/dL — AB (ref 65–99)
GLUCOSE-CAPILLARY: 143 mg/dL — AB (ref 65–99)
Glucose-Capillary: 286 mg/dL — ABNORMAL HIGH (ref 65–99)
Glucose-Capillary: 165 mg/dL — ABNORMAL HIGH (ref 65–99)
Glucose-Capillary: 145 mg/dL — ABNORMAL HIGH (ref 65–99)

## 2017-01-18 LAB — CBC
HEMATOCRIT: 35.7 % — AB (ref 36.0–46.0)
HEMOGLOBIN: 11.8 g/dL — AB (ref 12.0–15.0)
MCH: 28.1 pg (ref 26.0–34.0)
MCHC: 33.1 g/dL (ref 30.0–36.0)
MCV: 85 fL (ref 78.0–100.0)
Platelets: 174 10*3/uL (ref 150–400)
RBC: 4.2 MIL/uL (ref 3.87–5.11)
RDW: 13.2 % (ref 11.5–15.5)
WBC: 5.7 10*3/uL (ref 4.0–10.5)

## 2017-01-18 MED ORDER — DEXTROSE 5 % IV SOLN
1.0000 g | Freq: Three times a day (TID) | INTRAVENOUS | Status: DC
  Administered 2017-01-18 – 2017-01-23 (×15): 1 g via INTRAVENOUS
  Filled 2017-01-18 (×16): qty 1

## 2017-01-18 MED ORDER — OXYMETAZOLINE HCL 0.05 % NA SOLN
1.0000 | Freq: Two times a day (BID) | NASAL | Status: AC
  Administered 2017-01-18 – 2017-01-20 (×5): 1 via NASAL
  Filled 2017-01-18: qty 15

## 2017-01-18 MED ORDER — VANCOMYCIN HCL 10 G IV SOLR
1500.0000 mg | Freq: Once | INTRAVENOUS | Status: AC
  Administered 2017-01-18: 1500 mg via INTRAVENOUS
  Filled 2017-01-18: qty 1500

## 2017-01-18 MED ORDER — VANCOMYCIN HCL 10 G IV SOLR
1250.0000 mg | Freq: Two times a day (BID) | INTRAVENOUS | Status: DC
  Administered 2017-01-19 – 2017-01-20 (×3): 1250 mg via INTRAVENOUS
  Filled 2017-01-18 (×4): qty 1250

## 2017-01-18 NOTE — Progress Notes (Signed)
Patient administered insulin injections to self tonight.Will continue to monitor.

## 2017-01-18 NOTE — Progress Notes (Signed)
SATURATION QUALIFICATIONS: (This note is used to comply with regulatory documentation for home oxygen)  Patient Saturations on 2.5 liters of oxygen at rest = 84%  Patient Saturations on 2 Liters of oxygen while Ambulating = 77%  Patient Saturations on 6 Liters of Oxygen at rest = 87%  Respiratory Therapy notified regarding patients oxygen saturation of 87% while on 6 liters of Oxygen NP,   Please briefly explain why patient needs home oxygen:

## 2017-01-18 NOTE — Progress Notes (Signed)
Patient speaks limited English use of interpreter line to communicate with patient.Discussed with patient how to check blood sugars and that she will be taking insulin injections at home.Asked patient if she has seen any diabetes education videos since admit.Patient stated ,' No ." This nurse showed patient diabetes education videos # [phone] and [phone].

## 2017-01-18 NOTE — Progress Notes (Signed)
Patient son at bedside speaks [place].Patient  Son used to interpret.Discussed with patient how to give insulin injections and sites.Per patient son patient already educated on how to to give injections.Patient to administer insulin injections to self tonight.

## 2017-01-18 NOTE — Progress Notes (Signed)
Pharmacy Antibiotic Note Denise Mosley is a 56 y.o. female admitted on 01/14/2017 with influenza pneumonia. Initially started on ceftriaxone and azithromycin but with minimal improvement pharmacy asked to broaden antibiotics to Cefepime and vancomycin.    Plan: 1. Cefepime 1 gram IV every 8 hours  2. Vancomycin 1500 mg IV x 1 now followed by 1250 mg every 12 hours starting on 5/6 am  3. If [facility]com[facility] continued [facility]; goal trough 15-20 4. BMP in am to assess renal function   Height: 5' (152.4 cm) Weight: 159 lb 11.2 oz (72.4 kg) IBW/kg (Calculated) : 45.5  Temp (24hrs), Avg:98.5 F (36.9 C), Min:98.3 F (36.8 C), Max:98.7 F (37.1 C)   Recent Labs Lab 01/14/17 2042 01/14/17 2104 01/15/17 0346 01/16/17 0305 01/17/17 0438 01/18/17 0522  WBC 5.1  --  3.9* 5.2 3.5* 5.7  CREATININE 0.57  --  0.63  --   --   --   LATICACIDVEN  --  1.41  --   --   --   --     Estimated Creatinine Clearance: 72.3 mL/min (by C-G formula based on SCr of 0.63 mg/dL).    No Known Allergies  Antimicrobials this admission:  5/5 cefepime >>   5/5 vancomycin >>  5/5 Levaquin x 1 dose 5/1 Ceftriaxone 5/3 5/1 Azithromycin  5/2 5/2 Tamiflu > 5/7   Microbiology results: 5/1 Strep pna: negative 5/1 Legionella: negative  5/1 BCx: ngtd 5/1 UCx: E.coli 5/2 Influenza PCR:  A positive 5/2 HIV: negative  5/3 Sputum: px  Thank you for allowing pharmacy to be a part of this patient's care.  Pollyann SamplesAndy Arush Gatliff, PharmD, BCPS 01/18/2017, 2:34 PM

## 2017-01-18 NOTE — Progress Notes (Addendum)
PROGRESS NOTE   Denise Mosley  YWV:[id number]    DOB: 29-Nov-1960    DOA: 01/14/2017  PCP: Patient, No Pcp Per   I have briefly reviewed patients previous medical records in [facility].  Brief Narrative:  [name], lives with adult son, does not have a PCP, IADL, PMH of type II DM, HTN, former smoker, chronic back pain, presented to ED with 3 days history of productive cough, dyspnea, weakness, poor appetite and weakness. In ED, fever of 103.1, tachycardic 113/m, transient hypoxia 87-88 percent, normal lactate, sodium 127 and chest x-ray suggestive of lobar pneumonia. Admitted for community-acquired pneumonia.Flu confirmed. ID consulted. Despite appropriate antibiotics (ceftriaxone, azithromycin and levofloxacin started today), does not seem to be improving and radiologically worsening and hypoxic. ID reconsulted.   Assessment & Plan:   Principal Problem:   CAP (community acquired pneumonia) Active Problems:   Hyponatremia   Dehydration   Diabetes mellitus type 2, controlled ([place])   1. Community-acquired pneumonia complicating influenza A: HIV antibody: Negative, flu panel PCR: Positive for influenza A, urine Legionella antigen: Negative. Urine pneumococcal antigen negative. Blood cultures 2: Negative to date. Despite appropriate antibiotics, continued to spike high fevers hence ID consulted and indicated that ongoing fevers may be related to influenza. Completed 3 days of IV ceftriaxone and 2 days of azithromycin. ID changed to oral levofloxacin-got first dose on 5/5. Look slightly worse today with increasing dyspnea and hypoxia. Chest x-ray also worsened. Reviewed chest x-ray personally. Discussed with ID, recommend stopping levofloxacin and changing to IV cefepime and vancomycin and will see patient 5/6. Will need follow-up chest x-ray in 4 weeks to ensure resolution of pneumonia findings. Defervesced and clinically improved. 2. Influenza A: Tamiflu  initiated. Continue antipyretics with Tylenol alternating with ibuprofen. Improving. Complete 5 days of Tamiflu. 3. Sepsis secondary to pneumonia & influenza: Met sepsis criteria on admission. Sepsis physiology have improved. Antimicrobials as above. DC IV fluids. 4. Acute respiratory failure with hypoxia: Repeat chest x-ray 5/5 personally reviewed. Progressive bilateral infiltrates. Clinically not volume overloaded. Treat pneumonia as above. Oxygen supplementation. If worsens, may need to transfer to stepdown/ICU and consulted CCM. Monitor closely. Currently on VM 50% and saturating 89%. 5. Dehydration with hyponatremia: Sodium 127 on admission which may be a combination of dehydration and hyperglycemia. Dehydration resolved. Hyponatremia resolved. 6. Uncontrolled type II DM: A1c 12.3 suggesting poor outpatient control. Home metformin on hold. Needs close outpatient follow-up with a new PCP. Discussed with patient and she verbalizes understanding. Lantus started and increased Lantus to 20 units QHS and continue SSI. Better. Patient willing to do insulins at home. As discussed with case management, they will be able to assist with insulin at discharge. 7. Anemia and thrombocytopenia:? Related to acute infectious illness. Leukopenia resolved. Thrombocytopenia resolved 8. Essential hypertension: Controlled. Currently not on antihypertensives. 9. Chronic back pain:, worsened somewhat due to coughing spells. Treat supportively. Due to persistent complain of back pain, obtained T-spine x-ray which showed no acute findings. No acute findings on local exam. 10. Asymptomatic bacteriuria/Escherichia coli: Sensitive anyway to ceftriaxone & levofloxacin   DVT prophylaxis: Lovenox Code Status: Full Family Communication: None at bedside Disposition: DC home when medically stable, not stable for discharge.   Consultants:  Infectious disease  Procedures:  None  Antimicrobials:  IV ceftriaxone and  azithromycin -discontinue Tamiflu Oral levofloxacin 5/4 > discontinued IV cefepime and vancomycin 5/5 >   Subjective: Patient interviewed using video interpreter. Fevers and cough better. Still dyspneic, especially with exertion. Ongoing bilateral upper  and mid back pain, right >left. Hypoxic both at rest and with activity while on oxygen.  ROS: Does not currently smoke. States that she lives with her 78 year old son at home and is independent of activities of daily living. Goes to pharmacy for prescription refills even though she does not have a physician.  Objective:  Vitals:   01/17/17 1913 01/17/17 2158 01/18/17 0558 01/18/17 1344  BP:  (!) 94/58 101/60 99/60  Pulse:  74 73 73  Resp:  _0 Temp: 98.3 F (36.8 C) 98.7 F (37.1 C) 98.3 F (36.8 C) 98.7 F (37.1 C)  TempSrc: Oral     SpO2:  90% 90% 100%  Weight:      Height:        Examination:  General exam: [name] sitting up comfortably in bed this morning. Does not look septic or toxic.Look slightly worse today than she did yesterday. Respiratory system: Reduced breath sounds bilaterally with scattered bilateral, left base > right. Respiratory effort normal. On oxygen via [place]. Cardiovascular system: S1 & S2 heard, RRR. No JVD, murmurs, rubs, gallops or clicks. No pedal edema. Gastrointestinal system: Abdomen is nondistended, soft and nontender. No organomegaly or masses felt. Normal bowel sounds heard. Central nervous system: Alert and oriented. No focal neurological deficits. Extremities: Symmetric 5 x 5 power. Skin: No rashes, lesions or ulcers Psychiatry: Judgement and insight appear normal. Mood & affect appropriate.  Musculoskeletal system:? Mild right thoracic/mid scapular paraspinal area spasm/tenderness.    Data Reviewed: I have personally reviewed following labs and imaging studies  CBC:  Recent Labs Lab 01/14/17 2042 01/15/17 0346 01/16/17 0305 01/17/17 0438 01/18/17 0522    WBC 5.1 3.9* 5.2 3.5* 5.7  NEUTROABS 3.2  --   --   --   --   HGB 14.9 12.6 12.3 12.3 11.8*  HCT 43.9 38.0 37.0 37.5 35.7*  MCV 84.7 85.6 85.6 85.8 85.0  PLT 153 141* 140* 143* 364   Basic Metabolic Panel:  Recent Labs Lab 01/14/17 2042 01/15/17 0346  NA 127* 135  K 3.7 3.6  CL 96* 102  CO2 19* 23  GLUCOSE 258* 323*  BUN 11 11  CREATININE 0.57 0.63  CALCIUM 8.7* 8.2*   Liver Function Tests:  Recent Labs Lab 01/14/17 2042  AST 29  ALT 23  ALKPHOS 164*  BILITOT 0.3  PROT 8.0  ALBUMIN 3.4*   Coagulation Profile:  Recent Labs Lab 01/14/17 2042  INR 0.98   HbA1C: No results for input(s): HGBA1C in the last 72 hours. CBG:  Recent Labs Lab 01/17/17 1220 01/17/17 1655 01/17/17 2156 01/18/17 0808 01/18/17 1226  GLUCAP 281* 179* 212* 224* 286*    Recent Results (from the past 240 hour(s))  Blood culture (routine x 2)     Status: None (Preliminary result)   Collection Time: 01/14/17  8:27 PM  Result Value Ref Range Status   Specimen Description BLOOD RIGHT ARM  Final   Special Requests   Final    BOTTLES DRAWN AEROBIC AND ANAEROBIC Blood Culture adequate volume   Culture NO GROWTH 4 DAYS  Final   Report Status PENDING  Incomplete  Blood culture (routine x 2)     Status: None (Preliminary result)   Collection Time: 01/14/17  8:35 PM  Result Value Ref Range Status   Specimen Description BLOOD LEFT ARM  Final   Special Requests IN PEDIATRIC BOTTLE Blood Culture adequate volume  Final   Culture NO GROWTH 4 DAYS  Final  Report Status PENDING  Incomplete  Urine culture     Status: Abnormal   Collection Time: 01/14/17 10:50 PM  Result Value Ref Range Status   Specimen Description URINE, CLEAN CATCH  Final   Special Requests NONE  Final   Culture >=100,000 COLONIES/mL ESCHERICHIA COLI (A)  Final   Report Status 01/17/2017 FINAL  Final   Organism ID, Bacteria ESCHERICHIA COLI (A)  Final      Susceptibility   Escherichia coli - MIC*    AMPICILLIN >=32  RESISTANT Resistant     CEFAZOLIN <=4 SENSITIVE Sensitive     CEFTRIAXONE <=1 SENSITIVE Sensitive     CIPROFLOXACIN <=0.25 SENSITIVE Sensitive     GENTAMICIN >=16 RESISTANT Resistant     IMIPENEM 1 SENSITIVE Sensitive     NITROFURANTOIN <=16 SENSITIVE Sensitive     TRIMETH/SULFA <=20 SENSITIVE Sensitive     AMPICILLIN/SULBACTAM 16 INTERMEDIATE Intermediate     PIP/TAZO <=4 SENSITIVE Sensitive     Extended ESBL NEGATIVE Sensitive     * >=100,000 COLONIES/mL ESCHERICHIA COLI  Culture, sputum-assessment     Status: None   Collection Time: 01/15/17  9:40 AM  Result Value Ref Range Status   Specimen Description EXPECTORATED SPUTUM  Final   Special Requests NONE  Final   Sputum evaluation   Final    Sputum specimen not acceptable for testing.  Please recollect.   Results Called to: Titus Dubin, RN AT [place] ON 01/16/17. Colin Rhein, MLT    Report Status 01/16/2017 FINAL  Final  Culture, expectorated sputum-assessment     Status: None   Collection Time: 01/16/17  5:43 PM  Result Value Ref Range Status   Specimen Description EXPECTORATED SPUTUM  Final   Special Requests NONE  Final   Sputum evaluation THIS SPECIMEN IS ACCEPTABLE FOR SPUTUM CULTURE  Final   Report Status 01/16/2017 FINAL  Final  Culture, respiratory (NON-Expectorated)     Status: None (Preliminary result)   Collection Time: 01/16/17  5:43 PM  Result Value Ref Range Status   Specimen Description EXPECTORATED SPUTUM  Final   Special Requests NONE Reflexed from [id number]  Final   Gram Stain   Final    ABUNDANT WBC PRESENT,BOTH PMN AND MONONUCLEAR RARE GRAM POSITIVE COCCI IN PAIRS RARE GRAM NEGATIVE RODS    Culture CULTURE REINCUBATED FOR BETTER GROWTH  Final   Report Status PENDING  Incomplete         Radiology Studies: Dg Chest 2 View  Result Date: 01/18/2017 CLINICAL DATA:  Followup pneumonia. EXAM: CHEST  2 VIEW COMPARISON:  01/16/2017 FINDINGS: Mild cardiac enlargement. Multifocal airspace densities are identified  throughout the left lung. Less severe multifocal patchy opacities are noted within the right upper lobe and right base. When compared with the previous exam aeration of both lungs appears diminished. IMPRESSION: 1. Persistent and progressive bilateral pneumonia. Electronically Signed   By: Kerby Moors M.D.   On: 01/18/2017 13:26   Dg Thoracic Spine W/swimmers  Result Date: 01/18/2017 CLINICAL DATA:  Back pain. EXAM: THORACIC SPINE - 3 VIEWS COMPARISON:  Radiographs of Jan 16, 2017. FINDINGS: No fracture or spondylolisthesis is noted. 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 IMPRESSION: Mild degenerative changes are noted in lower thoracic spine. No acute abnormality is noted. Electronically Signed   By: Marijo Conception, M.D.   On: 01/18/2017 13:27        Scheduled Meds: . benzonatate  200 mg Oral TID  .  enoxaparin (LOVENOX) injection  40 mg Subcutaneous Q24H  . insulin aspart  0-15 Units Subcutaneous TID WC  . insulin aspart  0-5 Units Subcutaneous QHS  . insulin glargine  15 Units Subcutaneous QHS  . oseltamivir  75 mg Oral BID  . oxymetazoline  1 spray Each Nare BID   Continuous Infusions:    LOS: 4 days     ,, MD, FACP, FHM. [facility] Pager [phone]  If 7PM-7AM, please contact night-coverage [email or web address] Password [email or web address] 01/18/2017, 2:00 PM

## 2017-01-19 ENCOUNTER — Inpatient Hospital Stay (HOSPITAL_COMMUNITY): Payer: Medicaid Other

## 2017-01-19 DIAGNOSIS — Z87891 Personal history of nicotine dependence: Secondary | ICD-10-CM

## 2017-01-19 DIAGNOSIS — Z6831 Body mass index (BMI) 31.0-31.9, adult: Secondary | ICD-10-CM

## 2017-01-19 DIAGNOSIS — J09X2 Influenza due to identified novel influenza A virus with other respiratory manifestations: Secondary | ICD-10-CM

## 2017-01-19 DIAGNOSIS — R0902 Hypoxemia: Secondary | ICD-10-CM

## 2017-01-19 DIAGNOSIS — B9789 Other viral agents as the cause of diseases classified elsewhere: Secondary | ICD-10-CM

## 2017-01-19 DIAGNOSIS — A491 Streptococcal infection, unspecified site: Secondary | ICD-10-CM

## 2017-01-19 DIAGNOSIS — J1000 Influenza due to other identified influenza virus with unspecified type of pneumonia: Secondary | ICD-10-CM

## 2017-01-19 DIAGNOSIS — Z833 Family history of diabetes mellitus: Secondary | ICD-10-CM

## 2017-01-19 DIAGNOSIS — J111 Influenza due to unidentified influenza virus with other respiratory manifestations: Secondary | ICD-10-CM

## 2017-01-19 DIAGNOSIS — J9601 Acute respiratory failure with hypoxia: Secondary | ICD-10-CM

## 2017-01-19 DIAGNOSIS — J189 Pneumonia, unspecified organism: Secondary | ICD-10-CM

## 2017-01-19 LAB — BASIC METABOLIC PANEL
ANION GAP: 9 (ref 5–15)
ANION GAP: 8 (ref 5–15)
BUN: <5 mg/dL — ABNORMAL LOW (ref 6–20)
BUN: <5 mg/dL — ABNORMAL LOW (ref 6–20)
CHLORIDE: 102 mmol/L (ref 101–111)
CHLORIDE: 101 mmol/L (ref 101–111)
CO2: 28 mmol/L (ref 22–32)
CO2: 28 mmol/L (ref 22–32)
Calcium: 8.4 mg/dL — ABNORMAL LOW (ref 8.9–10.3)
Calcium: 8.5 mg/dL — ABNORMAL LOW (ref 8.9–10.3)
Creatinine, Ser: 0.55 mg/dL (ref 0.44–1.00)
Creatinine, Ser: 0.55 mg/dL (ref 0.44–1.00)
GFR calc non Af Amer: >60 mL/min (ref >60)
GFR calc non Af Amer: >60 mL/min (ref >60)
GLUCOSE: 141 mg/dL — AB (ref 65–99)
Glucose, Bld: 219 mg/dL — ABNORMAL HIGH (ref 65–99)
POTASSIUM: 2.3 mmol/L — AB (ref 3.5–5.1)
POTASSIUM: 3 mmol/L — AB (ref 3.5–5.1)
SODIUM: 137 mmol/L (ref 135–145)
Sodium: 139 mmol/L (ref 135–145)

## 2017-01-19 LAB — BLOOD GAS, ARTERIAL
ACID-BASE EXCESS: 4.2 mmol/L — AB (ref 0.0–2.0)
Bicarbonate: 27.8 mmol/L (ref 20.0–28.0)
Drawn by: 283381
FIO2: 50
O2 SAT: 93.7 %
PATIENT TEMPERATURE: 98.6
PCO2 ART: 39.1 mmHg (ref 32.0–48.0)
pH, Arterial: 7.466 — ABNORMAL HIGH (ref 7.350–7.450)
pO2, Arterial: 67 mmHg — ABNORMAL LOW (ref 83.0–108.0)

## 2017-01-19 LAB — CULTURE, BLOOD (ROUTINE X 2)
CULTURE: NO GROWTH
CULTURE: NO GROWTH
SPECIAL REQUESTS: ADEQUATE
Special Requests: ADEQUATE

## 2017-01-19 LAB — GLUCOSE, CAPILLARY
GLUCOSE-CAPILLARY: 134 mg/dL — AB (ref 65–99)
GLUCOSE-CAPILLARY: 164 mg/dL — AB (ref 65–99)
Glucose-Capillary: 168 mg/dL — ABNORMAL HIGH (ref 65–99)
Glucose-Capillary: 186 mg/dL — ABNORMAL HIGH (ref 65–99)

## 2017-01-19 LAB — PROCALCITONIN: Procalcitonin: 0.29 ng/mL

## 2017-01-19 LAB — MAGNESIUM: Magnesium: 1.9 mg/dL (ref 1.7–2.4)

## 2017-01-19 MED ORDER — ALBUTEROL SULFATE (2.5 MG/3ML) 0.083% IN NEBU
2.5000 mg | INHALATION_SOLUTION | RESPIRATORY_TRACT | Status: DC | PRN
  Administered 2017-01-20: 2.5 mg via RESPIRATORY_TRACT
  Filled 2017-01-19: qty 3

## 2017-01-19 MED ORDER — POTASSIUM CHLORIDE CRYS ER 20 MEQ PO TBCR: 40.0000 meq | EXTENDED_RELEASE_TABLET | Freq: Two times a day (BID) | ORAL | Status: DC

## 2017-01-19 MED ORDER — POTASSIUM CHLORIDE CRYS ER 20 MEQ PO TBCR
40.0000 meq | EXTENDED_RELEASE_TABLET | Freq: Once | ORAL | Status: AC
  Administered 2017-01-19: 40 meq via ORAL
  Filled 2017-01-19: qty 2

## 2017-01-19 MED ORDER — POTASSIUM CHLORIDE 10 MEQ/100ML IV SOLN
10.0000 meq | INTRAVENOUS | Status: DC
  Filled 2017-01-19: qty 100

## 2017-01-19 MED ORDER — POTASSIUM CHLORIDE IN NACL 40-0.9 MEQ/L-% IV SOLN
INTRAVENOUS | Status: DC
  Administered 2017-01-19 (×2): 50 mL/h via INTRAVENOUS
  Filled 2017-01-19 (×2): qty 1000

## 2017-01-19 MED ORDER — POTASSIUM CHLORIDE CRYS ER 20 MEQ PO TBCR
40.0000 meq | EXTENDED_RELEASE_TABLET | Freq: Two times a day (BID) | ORAL | Status: DC
  Administered 2017-01-19: 40 meq via ORAL
  Filled 2017-01-19: qty 2

## 2017-01-19 MED ORDER — FUROSEMIDE 10 MG/ML IJ SOLN
40.0000 mg | Freq: Once | INTRAMUSCULAR | Status: AC
  Administered 2017-01-19: 40 mg via INTRAVENOUS
  Filled 2017-01-19: qty 4

## 2017-01-19 MED ORDER — BUDESONIDE 0.5 MG/2ML IN SUSP
0.5000 mg | Freq: Two times a day (BID) | RESPIRATORY_TRACT | Status: DC
  Administered 2017-01-19 – 2017-01-22 (×7): 0.5 mg via RESPIRATORY_TRACT
  Filled 2017-01-19 (×7): qty 2

## 2017-01-19 MED ORDER — IBUPROFEN 400 MG PO TABS: 400.0000 mg | ORAL_TABLET | Freq: Three times a day (TID) | ORAL | Status: AC | PRN

## 2017-01-19 MED ORDER — IPRATROPIUM-ALBUTEROL 0.5-2.5 (3) MG/3ML IN SOLN
3.0000 mL | Freq: Four times a day (QID) | RESPIRATORY_TRACT | Status: DC
  Administered 2017-01-19 – 2017-01-20 (×4): 3 mL via RESPIRATORY_TRACT
  Filled 2017-01-19 (×4): qty 3

## 2017-01-19 NOTE — Progress Notes (Signed)
[facility]  Date of Admission:  01/14/2017    Total days of antibiotics 6        Day 5 oseltamivir        Day 2 vancomycin        Day 2 cefepime  Principal Problem:   CAP (community acquired pneumonia) Active Problems:   Hyponatremia   Dehydration   Diabetes mellitus type 2, controlled ([facility])   . benzonatate  200 mg Oral TID  . enoxaparin (LOVENOX) injection  40 mg Subcutaneous Q24H  . insulin aspart  0-15 Units Subcutaneous TID WC  . insulin aspart  0-5 Units Subcutaneous QHS  . insulin glargine  15 Units Subcutaneous QHS  . oseltamivir  75 mg Oral BID  . oxymetazoline  1 spray Each Nare BID    SUBJECTIVE: She indicates that her cough has improved but she is feeling more short of breath. She has had worsening hypoxia over the past 24 hours.  Review of Systems: Review of Systems  Constitutional: Negative for chills, diaphoresis and fever.  Respiratory: Positive for cough, sputum production and shortness of breath.   Cardiovascular: Negative for chest pain.    Past Medical History:  Diagnosis Date  . Diabetes mellitus without complication (HCC)   . Hypertension     Social History  Substance Use Topics  . Smoking status: Former Games developer  . Smokeless tobacco: Never Used     Comment: Quit 4 years ago  . Alcohol use No    Family History  Problem Relation Age of Onset  . Diabetes Mother   . Diabetes Father    No Known Allergies  OBJECTIVE: Vitals:   01/18/17 0558 01/18/17 1344 01/18/17 2213 01/19/17 0445  BP: 101/60 99/60 118/65 105/60  Pulse: 73 73 76 69  Resp: 18 20 (!) 24 18  Temp: 98.3 F (36.8 C) 98.7 F (37.1 C) 99.3 F (37.4 C) 99.4 F (37.4 C)  TempSrc:   Oral   SpO2: 90% 100% 94% 91%  Weight:      Height:       Body mass index is 31.19 kg/m.  Physical Exam  Constitutional:  She is resting quietly in bed.  HENT:  Mouth/Throat: No oropharyngeal exudate.  Cardiovascular: Normal rate and regular rhythm.    No murmur heard. Pulmonary/Chest: Effort normal. She has no wheezes. She has rales.  Bibasilar crackles posteriorly.    Lab Results Lab Results  Component Value Date   WBC 5.7 01/18/2017   HGB 11.8 (L) 01/18/2017   HCT 35.7 (L) 01/18/2017   MCV 85.0 01/18/2017   PLT 174 01/18/2017    Lab Results  Component Value Date   CREATININE 0.55 01/19/2017   BUN <5 (L) 01/19/2017   NA 139 01/19/2017   K 2.3 (LL) 01/19/2017   CL 102 01/19/2017   CO2 28 01/19/2017    Lab Results  Component Value Date   ALT 23 01/14/2017   AST 29 01/14/2017   ALKPHOS 164 (H) 01/14/2017   BILITOT 0.3 01/14/2017     Microbiology: Recent Results (from the past 240 hour(s))  Blood culture (routine x 2)     Status: None   Collection Time: 01/14/17  8:27 PM  Result Value Ref Range Status   Specimen Description BLOOD RIGHT ARM  Final   Special Requests   Final    BOTTLES DRAWN AEROBIC AND ANAEROBIC Blood Culture adequate volume   Culture NO GROWTH 5 DAYS  Final   Report Status 01/19/2017 FINAL  Final  Blood culture (routine x 2)     Status: None   Collection Time: 01/14/17  8:35 PM  Result Value Ref Range Status   Specimen Description BLOOD LEFT ARM  Final   Special Requests IN PEDIATRIC BOTTLE Blood Culture adequate volume  Final   Culture NO GROWTH 5 DAYS  Final   Report Status 01/19/2017 FINAL  Final  Urine culture     Status: Abnormal   Collection Time: 01/14/17 10:50 PM  Result Value Ref Range Status   Specimen Description URINE, CLEAN CATCH  Final   Special Requests NONE  Final   Culture >=100,000 COLONIES/mL ESCHERICHIA COLI (A)  Final   Report Status 01/17/2017 FINAL  Final   Organism ID, Bacteria ESCHERICHIA COLI (A)  Final      Susceptibility   Escherichia coli - MIC*    AMPICILLIN >=32 RESISTANT Resistant     CEFAZOLIN <=4 SENSITIVE Sensitive     CEFTRIAXONE <=1 SENSITIVE Sensitive     CIPROFLOXACIN <=0.25 SENSITIVE Sensitive     GENTAMICIN >=16 RESISTANT Resistant     IMIPENEM  1 SENSITIVE Sensitive     NITROFURANTOIN <=16 SENSITIVE Sensitive     TRIMETH/SULFA <=20 SENSITIVE Sensitive     AMPICILLIN/SULBACTAM 16 INTERMEDIATE Intermediate     PIP/TAZO <=4 SENSITIVE Sensitive     Extended ESBL NEGATIVE Sensitive     * >=100,000 COLONIES/mL ESCHERICHIA COLI  Culture, sputum-assessment     Status: None   Collection Time: 01/15/17  9:40 AM  Result Value Ref Range Status   Specimen Description EXPECTORATED SPUTUM  Final   Special Requests NONE  Final   Sputum evaluation   Final    Sputum specimen not acceptable for testing.  Please recollect.   Results Called to: Sharmon RevereI. OMOSEBEN, RN AT 1535 ON 01/16/17. Zachery Conch. JESSUP, MLT    Report Status 01/16/2017 FINAL  Final  Culture, expectorated sputum-assessment     Status: None   Collection Time: 01/16/17  5:43 PM  Result Value Ref Range Status   Specimen Description EXPECTORATED SPUTUM  Final   Special Requests NONE  Final   Sputum evaluation THIS SPECIMEN IS ACCEPTABLE FOR SPUTUM CULTURE  Final   Report Status 01/16/2017 FINAL  Final  Culture, respiratory (NON-Expectorated)     Status: None (Preliminary result)   Collection Time: 01/16/17  5:43 PM  Result Value Ref Range Status   Specimen Description EXPECTORATED SPUTUM  Final   Special Requests NONE Reflexed from [id number]  Final   Gram Stain   Final    ABUNDANT WBC PRESENT,BOTH PMN AND MONONUCLEAR RARE GRAM POSITIVE COCCI IN PAIRS RARE GRAM NEGATIVE RODS    Culture   Final    MODERATE STREPTOCOCCUS MITIS/ORALIS RARE CANDIDA ALBICANS SUSCEPTIBILITIES TO FOLLOW    Report Status PENDING  Incomplete   Legionella urinary antigen negative Pneumococcal urinary antigen negative HIV antibody negative  ASSESSMENT: She has progressive bilateral pneumonia. She has confirmed influenza A. I suspect that her worsening hypoxia is due to secondary bacterial superinfection with Streptococcus mitis/oralis. I will continue current antibiotic therapy pending further observation and final  susceptibilities for her Streptococcus.  PLAN: 1. Continue current antibiotics pending final sputum culture results  Cliffton AstersJohn Jozeph Persing, MD [facility] [phone] pager   [phone] cell 01/19/2017, 2:01 PM

## 2017-01-19 NOTE — Progress Notes (Signed)
Received critical K+ 2.3. Notified on call MD

## 2017-01-19 NOTE — Consult Note (Signed)
Name: Denise Mosley MRN: [id number] DOB: 09/07/1961    ADMISSION DATE:  01/14/2017 CONSULTATION DATE: 5/6  REFERRING MD :  [facility]  CHIEF COMPLAINT:  SOB  BRIEF PATIENT DESCRIPTION: WNWD female  SIGNIFICANT EVENTS  5/6 increased fio2 needs  STUDIES:     HISTORY OF PRESENT ILLNESS:   56 yo spanish female who does not speak english and was admitted 5/2 with BASDZ , CAP + flu a and strep mitis/oralis. Treated with vancomycin/cefepime/tamiflu. She actually was worse 5/6 and required 50% FM and PCCM was consulted. She was given lasix and has improved with diuresis. CxR is unchanged but does show significant BASDZ. If she doesn't respond to current treatment she may need to go to the unit. We will follow closely.  PAST MEDICAL HISTORY :   has a past medical history of Diabetes mellitus without complication (HCC) and Hypertension.  has a past surgical history that includes Cesarean section and Cholecystectomy. Prior to Admission medications   Medication Sig Start Date End Date Taking? Authorizing Provider  acetaminophen (TYLENOL) 500 MG tablet Take 1,000 mg by mouth every 6 (six) hours as needed for mild pain.    Yes [provider]  cephALEXin (KEFLEX) 500 MG capsule Take 1 capsule (500 mg total) by mouth 4 (four) times daily. Patient not taking: Reported on 01/15/2017 11/20/14   Gilda Crease, MD  HYDROcodone-acetaminophen (NORCO/VICODIN) 5-325 MG per tablet Take 2 tablets by mouth every 4 (four) hours as needed for moderate pain. Patient not taking: Reported on 01/15/2017 11/20/14   Gilda Crease, MD  ibuprofen (ADVIL,MOTRIN) 600 MG tablet Take 1 tablet (600 mg total) by mouth every 6 (six) hours as needed for mild pain or moderate pain. Patient not taking: Reported on 01/15/2017 08/21/16   Antony Madura, PA-C  metFORMIN (GLUCOPHAGE) 500 MG tablet Take 1 tablet (500 mg total) by mouth 2 (two) times daily with a meal. Patient not taking: Reported on 01/15/2017 08/21/16    Antony Madura, PA-C  predniSONE (DELTASONE) 20 MG tablet Take 2 tablets (40 mg total) by mouth daily. Take 40 mg by mouth daily for 3 days, then 20mg  by mouth daily for 3 days, then 10mg  daily for 3 days Patient not taking: Reported on 01/15/2017 08/21/16   Antony Madura, PA-C  valACYclovir (VALTREX) 1000 MG tablet Take 1 tablet (1,000 mg total) by mouth 3 (three) times daily. Patient not taking: Reported on 01/15/2017 08/21/16   Antony Madura, PA-C   No Known Allergies  FAMILY HISTORY:  family history includes Diabetes in her father and mother. SOCIAL HISTORY:  reports that she has quit smoking. She has never used smokeless tobacco. She reports that she does not drink alcohol or use drugs.  REVIEW OF SYSTEMS:   10 point review of system taken, please see HPI for positives and negatives.  SUBJECTIVE:  NAD   VITAL SIGNS: Temp:  [98.2 F (36.8 C)-99.4 F (37.4 C)] 98.2 F (36.8 C) (05/06 1546) Pulse Rate:  [69-80] 80 (05/06 1546) Resp:  [18-24] 20 (05/06 1546) BP: (105-118)/(60-65) 108/63 (05/06 1546) SpO2:  [91 %-100 %] 100 % (05/06 1546) FiO2 (%):  [45 %] 45 % (05/05 2213)  PHYSICAL EXAMINATION: General:  **WNWD female NAD Neuro:  Intact HEENT:  No jvd / lan Cardiovascular:  HSR RRR Lungs:  Decreased bs with mild rhonhi Abdomen:  Obese , soft +bs Musculoskeletal:  Intact Skin:  Warm and dry   Recent Labs Lab 01/15/17 0346 01/19/17 0441 01/19/17 1349  NA 135 139  137  K 3.6 2.3* 3.0*  CL 102 102 101  CO2 23 28 28   BUN 11 <5* <5*  CREATININE 0.63 0.55 0.55  GLUCOSE 323* 141* 219*    Recent Labs Lab 01/16/17 0305 01/17/17 0438 01/18/17 0522  HGB 12.3 12.3 11.8*  HCT 37.0 37.5 35.7*  WBC 5.2 3.5* 5.7  PLT 140* 143* 174   Dg Chest 2 View  Result Date: 01/19/2017 CLINICAL DATA:  Fever cough and back pain for 1 week.  Hypoxia. EXAM: CHEST  2 VIEW COMPARISON:  01/18/2017 FINDINGS: Bilateral airspace opacities, left-greater-than-right, are unchanged. Cardiomediastinal  silhouette is unchanged. No pleural effusion pneumothorax. This is a low volume film. IMPRESSION: Unchanged chest radiograph with bilateral airspace opacities, left-greater-than-right. Electronically Signed   By: Harmon Pier M.D.   On: 01/19/2017 12:26   Dg Chest 2 View  Result Date: 01/18/2017 CLINICAL DATA:  Followup pneumonia. EXAM: CHEST  2 VIEW COMPARISON:  01/16/2017 FINDINGS: Mild cardiac enlargement. Multifocal airspace densities are identified throughout the left lung. Less severe multifocal patchy opacities are noted within the right upper lobe and right base. When compared with the previous exam aeration of both lungs appears diminished. IMPRESSION: 1. Persistent and progressive bilateral pneumonia. Electronically Signed   By: Signa Kell M.D.   On: 01/18/2017 13:26   Dg Thoracic Spine W/swimmers  Result Date: 01/18/2017 CLINICAL DATA:  Back pain. EXAM: THORACIC SPINE - 3 VIEWS COMPARISON:  Radiographs of Jan 16, 2017. FINDINGS: No fracture or spondylolisthesis is noted. 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 IMPRESSION: Mild degenerative changes are noted in lower thoracic spine. No acute abnormality is noted. Electronically Signed   By: Lupita Raider, M.D.   On: 01/18/2017 13:27    ASSESSMENT     Acute respiratory failure with hypoxia (HCC) in setting of BASDZ, CAP   CAP (community acquired pneumonia):   Hyponatremia   Dehydration   Diabetes mellitus type 2, controlled [facility])  Discussion: 56 yo spanish female who does not speak english and was admitted 5/2 with BASDZ , CAP + flu a and strep mitis/oralis. Treated with vancomycin/cefepime/tamiflu. She actually was worse 5/6 and required 50% FM and PCCM was consulted. She was given lasix and has improved with diuresis. CxR is unchanged but does show significant BASDZ. If she doesn't respond to current treatment she may need to go to the unit. We will follow  closely.    PLAN:  Agree with abx and antivirals Lasix as tolerated If refractory to treatment will need to go to unit.   Brett Canales Minor ACNP [facility] PCCM Pager [phone] till 3 pm If no answer page [phone] 01/19/2017, 4:25 PM  ATTENDING NOTE / ATTESTATION NOTE :   I have discussed the case with the resident/APP  Brett Canales Minor NP  I agree with the resident/APP's  history, physical examination, assessment, and plans.    I have edited the above note and modified it according to our agreed history, physical examination, assessment and plan.   56 yo spanish female who does not speak english and was admitted 5/2 with dyspena and was diagnosed with  CAP + flu a and strep mitis/oralis. Treated with vancomycin/cefepime/tamiflu. She actually was worse 5/6 and required 50% FM and PCCM was consulted. She was given lasix and has improved with diuresis.   Vitals:  Vitals:   01/18/17 1344 01/18/17 2213 01/19/17 0445 01/19/17 1546  BP: 99/60 118/65 105/60 108/63  Pulse: 73 76  69 80  Resp: 20 (!) 24 18 20   Temp: 98.7 F (37.1 C) 99.3 F (37.4 C) 99.4 F (37.4 C) 98.2 F (36.8 C)  TempSrc:  Oral    SpO2: 100% 94% 91% 100%  Weight:      Height:        Constitutional/General: well-nourished, well-developed,comfortable, in mild distress  Body mass index is 31.19 kg/m. Wt Readings from Last 3 Encounters:  01/16/17 72.4 kg (159 lb 11.2 oz)  04/13/14 90.7 kg (200 lb)    HEENT: PERLA, anicteric sclerae. (-) Oral thrush. On VM   Neck: No masses. Midline trachea. No JVD, (-) LAD. (-) bruits appreciated.  Respiratory/Chest: Grossly normal chest. (-) deformity. (-) Accessory muscle use.  Symmetric expansion. Diminished BS on both lower lung zones. (-) wheezing,rhonchi Crackles mid-bases (-) egophony  Cardiovascular: Regular rate and  rhythm, heart sounds normal, no murmur or gallops,  Trace peripheral edema  Gastrointestinal:  Normal bowel sounds. Soft, non-tender. No  hepatosplenomegaly.  (-) masses.   Musculoskeletal:  Normal muscle tone.   Extremities: Grossly normal. (-) clubbing, cyanosis.  (-) edema  Skin: (-) rash,lesions seen.   Neurological/Psychiatric : sedated, intubated. CN grossly intact. (-) lateralizing signs.     CBC Recent Labs     01/17/17  0438  01/18/17  0522  WBC  3.5*  5.7  HGB  12.3  11.8*  HCT  37.5  35.7*  PLT  143*  174    Coag's No results for input(s): APTT, INR in the last 72 hours.  BMET Recent Labs     01/19/17  0441  01/19/17  1349  NA  139  137  K  2.3*  3.0*  CL  102  101  CO2  28  28  BUN  <5*  <5*  CREATININE  0.55  0.55  GLUCOSE  141*  219*    Electrolytes Recent Labs     01/19/17  0441  01/19/17  1349  CALCIUM  8.4*  8.5*  MG  1.9   --     Sepsis Markers Recent Labs     01/17/17  0438  01/19/17  0441  PROCALCITON  0.90  0.29    ABG Recent Labs     01/19/17  1138  PHART  7.466*  PCO2ART  39.1  PO2ART  67.0*    Liver Enzymes No results for input(s): AST, ALT, ALKPHOS, BILITOT, ALBUMIN in the last 72 hours.  Cardiac Enzymes No results for input(s): TROPONINI, PROBNP in the last 72 hours.  Glucose Recent Labs     01/18/17  1705  01/18/17  2059  01/18/17  2228  01/19/17  0815  01/19/17  1246  01/19/17  1653  GLUCAP  165*  143*  145*  134*  168*  186*    Imaging Dg Chest 2 View  Result Date: 01/19/2017 CLINICAL DATA:  Fever cough and back pain for 1 week.  Hypoxia. EXAM: CHEST  2 VIEW COMPARISON:  01/18/2017 FINDINGS: Bilateral airspace opacities, left-greater-than-right, are unchanged. Cardiomediastinal silhouette is unchanged. No pleural effusion pneumothorax. This is a low volume film. IMPRESSION: Unchanged chest radiograph with bilateral airspace opacities, left-greater-than-right. Electronically Signed   By: Harmon PierJeffrey  Hu M.D.   On: 01/19/2017 12:26   Dg Chest 2 View  Result Date: 01/18/2017 CLINICAL DATA:  Followup pneumonia. EXAM: CHEST  2 VIEW  COMPARISON:  01/16/2017 FINDINGS: Mild cardiac enlargement. Multifocal airspace densities are identified throughout the left lung. Less severe multifocal patchy opacities are noted  within the right upper lobe and right base. When compared with the previous exam aeration of both lungs appears diminished. IMPRESSION: 1. Persistent and progressive bilateral pneumonia. Electronically Signed   By: Signa Kell M.D.   On: 01/18/2017 13:26   Dg Thoracic Spine W/swimmers  Result Date: 01/18/2017 CLINICAL DATA:  Back pain. EXAM: THORACIC SPINE - 3 VIEWS COMPARISON:  Radiographs of Jan 16, 2017. FINDINGS: No fracture or spondylolisthesis is noted. 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 IMPRESSION: Mild degenerative changes are noted in lower thoracic spine. No acute abnormality is noted. Electronically Signed   By: Lupita Raider, M.D.   On: 01/18/2017 13:27    Assessment/Plan : Acute Hypoxemic Respiratory Failure 2/2 CAP + Influenza PNA + Acute Pulmonary edema - I think the initial presentation was pneumonia but her hypoxemia worsened because of pulmonary edema. She was on 50% Ventimask when I saw her and she was not in distress. - She needs aggressive diuresis but we also need to make sure her potassium is corrected. - Continue broad-spectrum antibiotics and Tamiflu per infectious disease. - Stop IV fluids. - Check 2-D echo. - If she gets worse, consider BiPAP therapy. May need to be transferred to stepdown unit.   Plan d/w Dr. Waymon Amato.    Pollie Meyer, MD 01/19/2017, 8:04 PM [facility] Pager [phone] After 3 pm or if no answer, call [phone]

## 2017-01-19 NOTE — Progress Notes (Addendum)
PROGRESS NOTE   Denise Mosley  ZCH:[id number]    DOB: [birth year]    DOA: 01/14/2017  PCP: Patient, No Pcp Per   I have briefly reviewed patients previous medical records in [facility].  Brief Narrative:  [name], lives with adult son, does not have a PCP, IADL, PMH of type II DM, HTN, former smoker, chronic back pain, presented to ED with 3 days history of productive cough, dyspnea, weakness, poor appetite and weakness. In ED, fever of 103.1, tachycardic 113/m, transient hypoxia 87-88 percent, normal lactate, sodium 127 and chest x-ray suggestive of lobar pneumonia. Admitted for community-acquired pneumonia.Flu confirmed. Despite appropriate antibiotics (ceftriaxone, azithromycin > levofloxacin), worsening clinically and radiologically. ID and CCM consulted.   Assessment & Plan:   Principal Problem:   CAP (community acquired pneumonia) Active Problems:   Hyponatremia   Dehydration   Diabetes mellitus type 2, controlled ([place])   1. Community-acquired pneumonia complicating influenza A: HIV antibody, urine Legionella and pneumococcal antigen: Negative, flu panel PCR: Positive for influenza A, Blood cultures 2: Negative to date. Despite appropriate antimicrobial regimen (Tamiflu, IV Rocephin/azithromycin >levofloxacin), continued to worsen clinically and radiologically. ID consulted and broadened antibiotics to IV cefepime and Zosyn and suspect worsening hypoxia is due to secondary bacterial superinfection with Streptococcus mitis/oralis. Due to progressive hypoxia, pulmonology consulted. Sputum culture: Streptococcus mitis/Oralis> sensitivities pending 2. Influenza A: Tamiflu initiated. Defervesced. Complete 5 days of Tamiflu. 3. Sepsis secondary to pneumonia & influenza: Met sepsis criteria on admission. Sepsis physiology have improved.  4. Acute respiratory failure with hypoxia/? ARDS: Progressive hypoxia despite treatment for pneumonia and flu. Currently  on 50% Ventimask saturating in the high 80s. No respiratory distress. Chest x-ray 5/6 reviewed personally: Extensive bilateral infiltrates, left >right. Pulmonology consulted and discussed with them. ABG: PH 7.47, PCO2 39, PO2 67, bicarbonate 27.8 and oxygen saturation 93.7% on 50% Ventimask. IV Lasix 40 mg 1 after oral potassium replacement. Follow closely, if worsens, will need transfer to ICU for ventilatory support. Patient and son aware. 5. Dehydration with hyponatremia: Sodium 127 on admission which may be a combination of dehydration and hyperglycemia. Dehydration resolved. Hyponatremia resolved. Discontinued IV fluids. 6. Uncontrolled type II DM: A1c 12.3 suggesting poor outpatient control. Home metformin on hold. Needs close outpatient follow-up with a new PCP. Discussed with patient and she verbalizes understanding. Continue Lantus 15 units QHS and continue SSI. Better. Patient willing to do insulins at home. As discussed with case management, they will be able to assist with insulin at discharge. 7. Anemia and thrombocytopenia:? Related to acute infectious illness. Leukopenia resolved. Thrombocytopenia resolved 8. Essential hypertension: Controlled. Currently not on antihypertensives. 9. Chronic back pain:, worsened somewhat due to coughing spells. Treat supportively. Due to persistent complain of back pain, obtained T-spine x-ray which showed no acute findings. No acute findings on local exam. 10. Asymptomatic bacteriuria/Escherichia coli: Sensitive anyway to ceftriaxone & levofloxacin 11. Hypokalemia: Replace aggressively and follow. Magnesium normal.   DVT prophylaxis: Lovenox Code Status: Full Family Communication: Discussed in detail with patient's son via phone. Updated care and answered questions. Disposition: DC home when medically stable, not stable for discharge.   Consultants:  Infectious disease Pulmonology  Procedures:  None  Antimicrobials:  IV ceftriaxone and  azithromycin -discontinued Tamiflu Oral levofloxacin 5/4 > discontinued IV cefepime and vancomycin 5/5 >   Subjective: Patient again interviewed via video interpreter. Fever and cough better. Dyspnea not improved and moderate. Appetite okay. Right >left back pain on coughing or deep inspiration.  ROS: Does  not currently smoke. States that she lives with her 32 year old son at home and is independent of activities of daily living. Goes to pharmacy for prescription refills even though she does not have a physician.  Objective:  Vitals:   01/18/17 0558 01/18/17 1344 01/18/17 2213 01/19/17 0445  BP: 101/60 99/60 118/65 105/60  Pulse: 73 73 76 69  Resp: 18 20 (!) 24 18  Temp: 98.3 F (36.8 C) 98.7 F (37.1 C) 99.3 F (37.4 C) 99.4 F (37.4 C)  TempSrc:   Oral   SpO2: 90% 100% 94% 91%  Weight:      Height:        Examination:  General exam: [name] sitting up comfortably in reclining chair this morning. Does not appear in any respiratory distress. Respiratory system: Harsh and decreased breath sounds bilaterally with crackles left >right. No wheezing or rhonchi. Respiratory effort normal. On oxygen via 50% Ventimask Cardiovascular system: S1 & S2 heard, RRR. No JVD, murmurs, rubs, gallops or clicks. No pedal edema. Telemetry: Sinus rhythm. Gastrointestinal system: Abdomen is nondistended, soft and nontender. No organomegaly or masses felt. Normal bowel sounds heard. Central nervous system: Alert and oriented. No focal neurological deficits. Extremities: Symmetric 5 x 5 power. Skin: No rashes, lesions or ulcers Psychiatry: Judgement and insight appear normal. Mood & affect appropriate.  Musculoskeletal system:? Mild right thoracic/mid scapular paraspinal area spasm/tenderness.    Data Reviewed: I have personally reviewed following labs and imaging studies  CBC:  Recent Labs Lab 01/14/17 2042 01/15/17 0346 01/16/17 0305 01/17/17 0438 01/18/17 0522    WBC 5.1 3.9* 5.2 3.5* 5.7  NEUTROABS 3.2  --   --   --   --   HGB 14.9 12.6 12.3 12.3 11.8*  HCT 43.9 38.0 37.0 37.5 35.7*  MCV 84.7 85.6 85.6 85.8 85.0  PLT 153 141* 140* 143* 163   Basic Metabolic Panel:  Recent Labs Lab 01/14/17 2042 01/15/17 0346 01/19/17 0441 01/19/17 1349  NA 127* 135 139 137  K 3.7 3.6 2.3* 3.0*  CL 96* 102 102 101  CO2 19* _0 GLUCOSE 258* 323* 141* 219*  BUN 11 11 <5* <5*  CREATININE 0.57 0.63 0.55 0.55  CALCIUM 8.7* 8.2* 8.4* 8.5*  MG  --   --  1.9  --    Liver Function Tests:  Recent Labs Lab 01/14/17 2042  AST 29  ALT 23  ALKPHOS 164*  BILITOT 0.3  PROT 8.0  ALBUMIN 3.4*   Coagulation Profile:  Recent Labs Lab 01/14/17 2042  INR 0.98   HbA1C: No results for input(s): HGBA1C in the last 72 hours. CBG:  Recent Labs Lab 01/18/17 1705 01/18/17 2059 01/18/17 2228 01/19/17 0815 01/19/17 1246  GLUCAP 165* 143* 145* 134* 168*    Recent Results (from the past 240 hour(s))  Blood culture (routine x 2)     Status: None   Collection Time: 01/14/17  8:27 PM  Result Value Ref Range Status   Specimen Description BLOOD RIGHT ARM  Final   Special Requests   Final    BOTTLES DRAWN AEROBIC AND ANAEROBIC Blood Culture adequate volume   Culture NO GROWTH 5 DAYS  Final   Report Status 01/19/2017 FINAL  Final  Blood culture (routine x 2)     Status: None   Collection Time: 01/14/17  8:35 PM  Result Value Ref Range Status   Specimen Description BLOOD LEFT ARM  Final   Special Requests IN PEDIATRIC BOTTLE Blood Culture adequate  volume  Final   Culture NO GROWTH 5 DAYS  Final   Report Status 01/19/2017 FINAL  Final  Urine culture     Status: Abnormal   Collection Time: 01/14/17 10:50 PM  Result Value Ref Range Status   Specimen Description URINE, CLEAN CATCH  Final   Special Requests NONE  Final   Culture >=100,000 COLONIES/mL ESCHERICHIA COLI (A)  Final   Report Status 01/17/2017 FINAL  Final   Organism ID, Bacteria  ESCHERICHIA COLI (A)  Final      Susceptibility   Escherichia coli - MIC*    AMPICILLIN >=32 RESISTANT Resistant     CEFAZOLIN <=4 SENSITIVE Sensitive     CEFTRIAXONE <=1 SENSITIVE Sensitive     CIPROFLOXACIN <=0.25 SENSITIVE Sensitive     GENTAMICIN >=16 RESISTANT Resistant     IMIPENEM 1 SENSITIVE Sensitive     NITROFURANTOIN <=16 SENSITIVE Sensitive     TRIMETH/SULFA <=20 SENSITIVE Sensitive     AMPICILLIN/SULBACTAM 16 INTERMEDIATE Intermediate     PIP/TAZO <=4 SENSITIVE Sensitive     Extended ESBL NEGATIVE Sensitive     * >=100,000 COLONIES/mL ESCHERICHIA COLI  Culture, sputum-assessment     Status: None   Collection Time: 01/15/17  9:40 AM  Result Value Ref Range Status   Specimen Description EXPECTORATED SPUTUM  Final   Special Requests NONE  Final   Sputum evaluation   Final    Sputum specimen not acceptable for testing.  Please recollect.   Results Called to: Titus Dubin, RN AT [facility] ON 01/16/17. Colin Rhein, MLT    Report Status 01/16/2017 FINAL  Final  Culture, expectorated sputum-assessment     Status: None   Collection Time: 01/16/17  5:43 PM  Result Value Ref Range Status   Specimen Description EXPECTORATED SPUTUM  Final   Special Requests NONE  Final   Sputum evaluation THIS SPECIMEN IS ACCEPTABLE FOR SPUTUM CULTURE  Final   Report Status 01/16/2017 FINAL  Final  Culture, respiratory (NON-Expectorated)     Status: None (Preliminary result)   Collection Time: 01/16/17  5:43 PM  Result Value Ref Range Status   Specimen Description EXPECTORATED SPUTUM  Final   Special Requests NONE Reflexed from [id number]  Final   Gram Stain   Final    ABUNDANT WBC PRESENT,BOTH PMN AND MONONUCLEAR RARE GRAM POSITIVE COCCI IN PAIRS RARE GRAM NEGATIVE RODS    Culture   Final    MODERATE STREPTOCOCCUS MITIS/ORALIS RARE CANDIDA ALBICANS SUSCEPTIBILITIES TO FOLLOW    Report Status PENDING  Incomplete         Radiology Studies: Dg Chest 2 View  Result Date: 01/19/2017 CLINICAL  DATA:  Fever cough and back pain for 1 week.  Hypoxia. EXAM: CHEST  2 VIEW COMPARISON:  01/18/2017 FINDINGS: Bilateral airspace opacities, left-greater-than-right, are unchanged. Cardiomediastinal silhouette is unchanged. No pleural effusion pneumothorax. This is a low volume film. IMPRESSION: Unchanged chest radiograph with bilateral airspace opacities, left-greater-than-right. Electronically Signed   By: Margarette Canada M.D.   On: 01/19/2017 12:26   Dg Chest 2 View  Result Date: 01/18/2017 CLINICAL DATA:  Followup pneumonia. EXAM: CHEST  2 VIEW COMPARISON:  01/16/2017 FINDINGS: Mild cardiac enlargement. Multifocal airspace densities are identified throughout the left lung. Less severe multifocal patchy opacities are noted within the right upper lobe and right base. When compared with the previous exam aeration of both lungs appears diminished. IMPRESSION: 1. Persistent and progressive bilateral pneumonia. Electronically Signed   By: Kerby Moors M.D.   On: 01/18/2017  13:26   Dg Thoracic Spine W/swimmers  Result Date: 01/18/2017 CLINICAL DATA:  Back pain. EXAM: THORACIC SPINE - 3 VIEWS COMPARISON:  Radiographs of Jan 16, 2017. FINDINGS: No fracture or spondylolisthesis is noted. 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 IMPRESSION: Mild degenerative changes are noted in lower thoracic spine. No acute abnormality is noted. Electronically Signed   By: Marijo Conception, M.D.   On: 01/18/2017 13:27        Scheduled Meds: . benzonatate  200 mg Oral TID  . enoxaparin (LOVENOX) injection  40 mg Subcutaneous Q24H  . insulin aspart  0-15 Units Subcutaneous TID WC  . insulin aspart  0-5 Units Subcutaneous QHS  . insulin glargine  15 Units Subcutaneous QHS  . oseltamivir  75 mg Oral BID  . oxymetazoline  1 spray Each Nare BID   Continuous Infusions: . ceFEPime (MAXIPIME) IV Stopped (01/19/17 0810)  . vancomycin Stopped (01/19/17 0721)      LOS: 5 days     Glenden Rossell, MD, FACP, FHM. [facility] Pager [phone]  If 7PM-7AM, please contact night-coverage [email or web address] Password [email or web address] 01/19/2017, 3:15 PM

## 2017-01-20 ENCOUNTER — Inpatient Hospital Stay (HOSPITAL_COMMUNITY): Payer: Medicaid Other

## 2017-01-20 DIAGNOSIS — B371 Pulmonary candidiasis: Secondary | ICD-10-CM

## 2017-01-20 DIAGNOSIS — J1100 Influenza due to unidentified influenza virus with unspecified type of pneumonia: Secondary | ICD-10-CM

## 2017-01-20 DIAGNOSIS — R7881 Bacteremia: Secondary | ICD-10-CM

## 2017-01-20 DIAGNOSIS — J810 Acute pulmonary edema: Secondary | ICD-10-CM

## 2017-01-20 DIAGNOSIS — J9811 Atelectasis: Secondary | ICD-10-CM

## 2017-01-20 LAB — CBC
HEMATOCRIT: 38.3 % (ref 36.0–46.0)
Hemoglobin: 12.7 g/dL (ref 12.0–15.0)
MCH: 28.4 pg (ref 26.0–34.0)
MCHC: 33.2 g/dL (ref 30.0–36.0)
MCV: 85.7 fL (ref 78.0–100.0)
Platelets: 243 10*3/uL (ref 150–400)
RBC: 4.47 MIL/uL (ref 3.87–5.11)
RDW: 13.3 % (ref 11.5–15.5)
WBC: 5.5 10*3/uL (ref 4.0–10.5)

## 2017-01-20 LAB — CULTURE, RESPIRATORY

## 2017-01-20 LAB — BASIC METABOLIC PANEL
ANION GAP: 10 (ref 5–15)
BUN: 5 mg/dL — AB (ref 6–20)
CHLORIDE: 104 mmol/L (ref 101–111)
CO2: 25 mmol/L (ref 22–32)
Calcium: 8.6 mg/dL — ABNORMAL LOW (ref 8.9–10.3)
Creatinine, Ser: 0.44 mg/dL (ref 0.44–1.00)
Glucose, Bld: 155 mg/dL — ABNORMAL HIGH (ref 65–99)
POTASSIUM: 3 mmol/L — AB (ref 3.5–5.1)
Sodium: 139 mmol/L (ref 135–145)

## 2017-01-20 LAB — GLUCOSE, CAPILLARY
GLUCOSE-CAPILLARY: 130 mg/dL — AB (ref 65–99)
GLUCOSE-CAPILLARY: 155 mg/dL — AB (ref 65–99)
Glucose-Capillary: 219 mg/dL — ABNORMAL HIGH (ref 65–99)
Glucose-Capillary: 163 mg/dL — ABNORMAL HIGH (ref 65–99)

## 2017-01-20 LAB — MAGNESIUM: MAGNESIUM: 1.9 mg/dL (ref 1.7–2.4)

## 2017-01-20 LAB — PHOSPHORUS: PHOSPHORUS: 3.5 mg/dL (ref 2.5–4.6)

## 2017-01-20 LAB — BRAIN NATRIURETIC PEPTIDE: B Natriuretic Peptide: 15.4 pg/mL (ref 0.0–100.0)

## 2017-01-20 LAB — CULTURE, RESPIRATORY W GRAM STAIN

## 2017-01-20 MED ORDER — POTASSIUM CHLORIDE CRYS ER 20 MEQ PO TBCR: 40.0000 meq | EXTENDED_RELEASE_TABLET | Freq: Two times a day (BID) | ORAL | Status: DC

## 2017-01-20 MED ORDER — POTASSIUM CHLORIDE CRYS ER 20 MEQ PO TBCR
40.0000 meq | EXTENDED_RELEASE_TABLET | Freq: Once | ORAL | Status: AC
  Administered 2017-01-20: 40 meq via ORAL
  Filled 2017-01-20: qty 2

## 2017-01-20 MED ORDER — FUROSEMIDE 10 MG/ML IJ SOLN
40.0000 mg | Freq: Two times a day (BID) | INTRAMUSCULAR | Status: DC
  Administered 2017-01-20 – 2017-01-23 (×7): 40 mg via INTRAVENOUS
  Filled 2017-01-20 (×7): qty 4

## 2017-01-20 MED ORDER — POTASSIUM CHLORIDE CRYS ER 20 MEQ PO TBCR: 80.0000 meq | EXTENDED_RELEASE_TABLET | Freq: Every day | ORAL | Status: DC

## 2017-01-20 MED ORDER — IPRATROPIUM-ALBUTEROL 0.5-2.5 (3) MG/3ML IN SOLN: 3.0000 mL | RESPIRATORY_TRACT | Status: DC | PRN

## 2017-01-20 NOTE — Progress Notes (Signed)
INFECTIOUS DISEASE PROGRESS NOTE  ID: Denise Mosley is a 56 y.o. female with  Principal Problem:   CAP (community acquired pneumonia) Active Problems:   Hyponatremia   Dehydration   Diabetes mellitus type 2, controlled (HCC)   Acute respiratory failure with hypoxia (HCC) in setting of BASDZ, CAP   Flu  Subjective: Without complaints. Feels like she is breathing better.  Denies cough.   Abtx:  Anti-infectives    Start     Dose/Rate Route Frequency Ordered Stop   01/19/17 0500  vancomycin (VANCOCIN) 1,250 mg in sodium chloride 0.9 % 250 mL IVPB     1,250 mg 166.7 mL/hr over 90 Minutes Intravenous Every 12 hours 01/18/17 1429     01/18/17 1430  ceFEPIme (MAXIPIME) 1 g in dextrose 5 % 50 mL IVPB     1 g 100 mL/hr over 30 Minutes Intravenous Every 8 hours 01/18/17 1428     01/18/17 1430  vancomycin (VANCOCIN) 1,500 mg in sodium chloride 0.9 % 500 mL IVPB     1,500 mg 250 mL/hr over 120 Minutes Intravenous  Once 01/18/17 1428 01/18/17 1731   01/18/17 1000  levofloxacin (LEVAQUIN) tablet 500 mg  Status:  Discontinued     500 mg Oral Daily 01/17/17 1110 01/18/17 1400   01/15/17 2230  oseltamivir (TAMIFLU) capsule 75 mg     75 mg Oral 2 times daily 01/15/17 2201 01/20/17 0930   01/15/17 2200  cefTRIAXone (ROCEPHIN) 1 g in dextrose 5 % 50 mL IVPB  Status:  Discontinued     1 g 100 mL/hr over 30 Minutes Intravenous Every 24 hours 01/15/17 0314 01/17/17 1110   01/15/17 2000  azithromycin (ZITHROMAX) 500 mg in dextrose 5 % 250 mL IVPB  Status:  Discontinued     500 mg 250 mL/hr over 60 Minutes Intravenous Every 24 hours 01/15/17 0314 01/16/17 1341   01/14/17 2215  cefTRIAXone (ROCEPHIN) 1 g in dextrose 5 % 50 mL IVPB     1 g 100 mL/hr over 30 Minutes Intravenous  Once 01/14/17 2212 01/14/17 2342   01/14/17 2215  azithromycin (ZITHROMAX) tablet 500 mg     500 mg Oral  Once 01/14/17 2212 01/14/17 2312      Medications:  Scheduled: . benzonatate  200 mg Oral TID  . budesonide  (PULMICORT) nebulizer solution  0.5 mg Nebulization BID  . enoxaparin (LOVENOX) injection  40 mg Subcutaneous Q24H  . furosemide  40 mg Intravenous BID  . insulin aspart  0-15 Units Subcutaneous TID WC  . insulin aspart  0-5 Units Subcutaneous QHS  . insulin glargine  15 Units Subcutaneous QHS  . ipratropium-albuterol  3 mL Nebulization QID  . oxymetazoline  1 spray Each Nare BID  . [START ON 01/21/2017] potassium chloride  40 mEq Oral BID    Objective: Vital signs in last 24 hours: Temp:  [98.1 F (36.7 C)-98.3 F (36.8 C)] 98.3 F (36.8 C) (05/07 0523) Pulse Rate:  [80-86] 81 (05/07 0523) Resp:  [18-20] 18 (05/07 0523) BP: (104-111)/(54-63) 111/62 (05/07 0523) SpO2:  [92 %-100 %] 92 % (05/07 1015) FiO2 (%):  [50 %] 50 % (05/07 0819) Weight:  [81.3 kg (179 lb 3.7 oz)] 81.3 kg (179 lb 3.7 oz) (05/07 0523)   General appearance: alert, cooperative and no distress Resp: rhonchi bilaterally Cardio: regular rate and rhythm GI: normal findings: bowel sounds normal and soft, non-tender Extremities: edema none   Lab Results  Recent Labs  01/18/17 0522  01/19/17 1349 01/20/17  0702  WBC 5.7  --   --  5.5  HGB 11.8*  --   --  12.7  HCT 35.7*  --   --  38.3  NA  --   < > 137 139  K  --   < > 3.0* 3.0*  CL  --   < > 101 104  CO2  --   < > 28 25  BUN  --   < > <5* 5*  CREATININE  --   < > 0.55 0.44  < > = values in this interval not displayed. Liver Panel No results for input(s): PROT, ALBUMIN, AST, ALT, ALKPHOS, BILITOT, BILIDIR, IBILI in the last 72 hours. Sedimentation Rate No results for input(s): ESRSEDRATE in the last 72 hours. C-Reactive Protein No results for input(s): CRP in the last 72 hours.  Microbiology: Recent Results (from the past 240 hour(s))  Blood culture (routine x 2)     Status: None   Collection Time: 01/14/17  8:27 PM  Result Value Ref Range Status   Specimen Description BLOOD RIGHT ARM  Final   Special Requests   Final    BOTTLES DRAWN AEROBIC  AND ANAEROBIC Blood Culture adequate volume   Culture NO GROWTH 5 DAYS  Final   Report Status 01/19/2017 FINAL  Final  Blood culture (routine x 2)     Status: None   Collection Time: 01/14/17  8:35 PM  Result Value Ref Range Status   Specimen Description BLOOD LEFT ARM  Final   Special Requests IN PEDIATRIC BOTTLE Blood Culture adequate volume  Final   Culture NO GROWTH 5 DAYS  Final   Report Status 01/19/2017 FINAL  Final  Urine culture     Status: Abnormal   Collection Time: 01/14/17 10:50 PM  Result Value Ref Range Status   Specimen Description URINE, CLEAN CATCH  Final   Special Requests NONE  Final   Culture >=100,000 COLONIES/mL ESCHERICHIA COLI (A)  Final   Report Status 01/17/2017 FINAL  Final   Organism ID, Bacteria ESCHERICHIA COLI (A)  Final      Susceptibility   Escherichia coli - MIC*    AMPICILLIN >=32 RESISTANT Resistant     CEFAZOLIN <=4 SENSITIVE Sensitive     CEFTRIAXONE <=1 SENSITIVE Sensitive     CIPROFLOXACIN <=0.25 SENSITIVE Sensitive     GENTAMICIN >=16 RESISTANT Resistant     IMIPENEM 1 SENSITIVE Sensitive     NITROFURANTOIN <=16 SENSITIVE Sensitive     TRIMETH/SULFA <=20 SENSITIVE Sensitive     AMPICILLIN/SULBACTAM 16 INTERMEDIATE Intermediate     PIP/TAZO <=4 SENSITIVE Sensitive     Extended ESBL NEGATIVE Sensitive     * >=100,000 COLONIES/mL ESCHERICHIA COLI  Culture, sputum-assessment     Status: None   Collection Time: 01/15/17  9:40 AM  Result Value Ref Range Status   Specimen Description EXPECTORATED SPUTUM  Final   Special Requests NONE  Final   Sputum evaluation   Final    Sputum specimen not acceptable for testing.  Please recollect.   Results Called to: Sharmon Revere, RN AT 1535 ON 01/16/17. Zachery Conch, MLT    Report Status 01/16/2017 FINAL  Final  Culture, expectorated sputum-assessment     Status: None   Collection Time: 01/16/17  5:43 PM  Result Value Ref Range Status   Specimen Description EXPECTORATED SPUTUM  Final   Special Requests  NONE  Final   Sputum evaluation THIS SPECIMEN IS ACCEPTABLE FOR SPUTUM CULTURE  Final  Report Status 01/16/2017 FINAL  Final  Culture, respiratory (NON-Expectorated)     Status: None   Collection Time: 01/16/17  5:43 PM  Result Value Ref Range Status   Specimen Description EXPECTORATED SPUTUM  Final   Special Requests NONE Reflexed from [id number]  Final   Gram Stain   Final    ABUNDANT WBC PRESENT,BOTH PMN AND MONONUCLEAR RARE GRAM POSITIVE COCCI IN PAIRS RARE GRAM NEGATIVE RODS    Culture   Final    MODERATE STREPTOCOCCUS MITIS/ORALIS RARE CANDIDA ALBICANS PURE CULTURE OF THESE TWO ORGANISMS    Report Status 01/20/2017 FINAL  Final    Studies/Results: Dg Chest 2 View  Result Date: 01/20/2017 CLINICAL DATA:  Hypoxia EXAM: CHEST  2 VIEW COMPARISON:  01/19/2017 FINDINGS: Airspace disease continues throughout the left lung. Patchy opacities in the right lung. No real change since prior study. Heart is borderline in size. No effusions. IMPRESSION: Stable bilateral airspace disease, left greater than right. Electronically Signed   By: Charlett NoseKevin  Dover M.D.   On: 01/20/2017 08:15   Dg Chest 2 View  Result Date: 01/19/2017 CLINICAL DATA:  Fever cough and back pain for 1 week.  Hypoxia. EXAM: CHEST  2 VIEW COMPARISON:  01/18/2017 FINDINGS: Bilateral airspace opacities, left-greater-than-right, are unchanged. Cardiomediastinal silhouette is unchanged. No pleural effusion pneumothorax. This is a low volume film. IMPRESSION: Unchanged chest radiograph with bilateral airspace opacities, left-greater-than-right. Electronically Signed   By: Harmon PierJeffrey  Hu M.D.   On: 01/19/2017 12:26   Dg Chest 2 View  Result Date: 01/18/2017 CLINICAL DATA:  Followup pneumonia. EXAM: CHEST  2 VIEW COMPARISON:  01/16/2017 FINDINGS: Mild cardiac enlargement. Multifocal airspace densities are identified throughout the left lung. Less severe multifocal patchy opacities are noted within the right upper lobe and right base. When  compared with the previous exam aeration of both lungs appears diminished. IMPRESSION: 1. Persistent and progressive bilateral pneumonia. Electronically Signed   By: Signa Kellaylor  Stroud M.D.   On: 01/18/2017 13:26   Dg Thoracic Spine W/swimmers  Result Date: 01/18/2017 CLINICAL DATA:  Back pain. EXAM: THORACIC SPINE - 3 VIEWS COMPARISON:  Radiographs of Jan 16, 2017. FINDINGS: No fracture or spondylolisthesis is noted. 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 IMPRESSION: Mild degenerative changes are noted in lower thoracic spine. No acute abnormality is noted. Electronically Signed   By: Lupita RaiderJames  Green Jr, M.D.   On: 01/18/2017 13:27     Assessment/Plan: CAP Influenza A Strep mitis and Candida in sputum (suspect oral contaminant)  Total days of antibiotics: 7 (oseltamivir 7, vanco/cefepime 3) Would stop vanco with no MRSA CXR unchanged.  oseltamivir ends today          Johny SaxJeffrey Shanoah Asbill Infectious Diseases (pager) [phone] [email or web address] 01/20/2017, 11:39 AM  LOS: 6 days

## 2017-01-20 NOTE — Progress Notes (Addendum)
Inpatient Diabetes Program Recommendations  AACE/ADA: New Consensus Statement on Inpatient Glycemic Control (2015)  Target Ranges:  Prepandial:   less than 140 mg/dL      Peak postprandial:   less than 180 mg/dL (1-2 hours)      Critically ill patients:  140 - 180 mg/dL   Lab Results  Component Value Date   GLUCAP 219 (H) 01/20/2017   HGBA1C 12.3 (H) 01/15/2017    Review of Glycemic Control Results for Denise Mosley, Denise Mosley (MRN [id number]) as of 01/20/2017 14:38  Ref. Range 01/19/2017 12:46 01/19/2017 16:53 01/19/2017 21:35 01/20/2017 08:39 01/20/2017 12:21  Glucose-Capillary Latest Ref Range: 65 - 99 mg/dL 409168 (H) 811186 (H) 914164 (H) 130 (H) 219 (H)   Inpatient Diabetes Program Recommendations:  Spoke with patient @ bedside @ length using STRATUS for interpretation. Patient states willingness to take insulin injections and reviewed using insulin syringe. Patient was able to return demonstration of how to draw up insulin and administer. RN Edgardo RoysGreta states patient did well giving her own insulin injection @ noon and has been administering over the weekend. Patient has a Relion meter @ home and strips to use to check CBGs. Reviewed hypoglycemia with patient along with using new vial of insulin every 28 days. Patient able to give correction information about what to do for hypoglycemia. Reviewed basic nutrition information with patient regarding drinks with sugar and carbohydrates. Due to pt does not have insurance, please consider 70/30 insulin 10 units bid from [employer] (approx 14 units basal + 6 units meal coverage).  Thank you, Billy FischerJudy E. Orry Sigl, RN, MSN, CDE  Diabetes Coordinator Inpatient Glycemic Control Team Team Pager [phone] (8am-5pm) 01/20/2017 2:46 PM

## 2017-01-20 NOTE — Progress Notes (Addendum)
PROGRESS NOTE   Denise Mosley  PYP:[id number]    DOB: September 08, 1961    DOA: 01/14/2017  PCP: Patient, No Pcp Per   I have briefly reviewed patients previous medical records in [facility].  Brief Narrative:  [name], lives with adult son, does not have a PCP, IADL, PMH of type II DM, HTN, former smoker, chronic back pain, presented to ED with 3 days history of productive cough, dyspnea, weakness, poor appetite and weakness. In ED, fever of 103.1, tachycardic 113/m, transient hypoxia 87-88 percent, normal lactate, sodium 127 and chest x-ray suggestive of lobar pneumonia. Admitted for community-acquired pneumonia.Flu confirmed. Despite appropriate antibiotics (ceftriaxone, azithromycin > levofloxacin), worsening clinically and radiologically. ID and CCM consulted.Slowly improving.   Assessment & Plan:   Principal Problem:   CAP (community acquired pneumonia) Active Problems:   Hyponatremia   Dehydration   Diabetes mellitus type 2, controlled ([place])   Acute respiratory failure with hypoxia (HCC) in setting of BASDZ, CAP   Flu   Acute pulmonary edema (HCC)   Atelectasis   1. Community-acquired pneumonia complicating influenza A: HIV antibody, urine Legionella and pneumococcal antigen: Negative, flu panel PCR: Positive for influenza A, Blood cultures 2: Negative. Despite appropriate antimicrobial regimen (Tamiflu, IV Rocephin/azithromycin >levofloxacin), continued to worsen clinically and radiologically. ID consulted and broadened antibiotics to IV cefepime and Zosyn and suspect worsening hypoxia is due to secondary bacterial superinfection with Streptococcus mitis/oralis Vs this being oral contamination. Sputum culture: Streptococcus mitis/Oralis> sensitivities pending. Pulmonology and ID follow-up appreciated. Vancomycin discontinued 5/7. 2. Influenza A: Tamiflu initiated. Defervesced. Completes 5 days of Tamiflu on 5/7. 3. Sepsis secondary to pneumonia &  influenza: Met sepsis criteria on admission. Sepsis physiology have improved.  4. ? Acute pulmonary edema: Lasix. Follow 2-D echo. 5. Acute respiratory failure with hypoxia/? ARDS: Progressive hypoxia despite treatment for pneumonia and flu. Suspect due to multilobar pneumonia, influenza and? Pulmonary edema. Received Lasix 40 mg IV 1. Chest x-ray 5/7 unchanged. Improving and down to 10 L high flow nasal cannula oxygen. Wean as tolerated but may need home oxygen. If worsens, transferred to stepdown or ICU and call CCM. 6. Dehydration with hyponatremia: Resolved. 7. Uncontrolled type II DM: A1c 12.3 suggesting poor outpatient control. Home metformin on hold. Needs close outpatient follow-up with a new PCP. Discussed with patient and she verbalizes understanding. Continue Lantus 15 units QHS and continue SSI. Better. Patient willing to do insulins at home. As discussed with case management, they will be able to assist with insulin at discharge > consider Lantus/SSI (if CM can assist) Vs 70/30 insulin at DC. 8. Anemia and thrombocytopenia: CBC has normalized on 4/7. 9. Essential hypertension: Controlled. Currently not on antihypertensives. 10. Chronic back pain:, worsened somewhat due to coughing spells. Treat supportively. Due to persistent complain of back pain, obtained T-spine x-ray which showed no acute findings. No acute findings on local exam. Improved. 11. Asymptomatic bacteriuria/Escherichia coli: Sensitive anyway to ceftriaxone & levofloxacin 12. Hypokalemia: Replace aggressively and follow. Magnesium normal.   DVT prophylaxis: Lovenox Code Status: Full Family Communication: Discussed in detail with patient's son at bedside. Updated care and answered questions. Disposition: DC home when medically stable, not stable for discharge.   Consultants:  Infectious disease Pulmonology  Procedures:  None  Antimicrobials:  IV ceftriaxone and azithromycin -discontinued Tamiflu Oral  levofloxacin 5/4 > discontinued IV cefepime > IV vancomycin >discontinued   Subjective: Patient again interviewed via video interpreter. Dyspnea improved. Fevers resolved. Cough improved. Back pain only when laying in  bed or with coughing. Appetite good.  ROS: Does not currently smoke. States that she lives with her 27 year old son at home and is independent of activities of daily living. Goes to pharmacy for prescription refills even though she does not have a physician.  Objective:  Vitals:   01/20/17 0819 01/20/17 1015 01/20/17 1304 01/20/17 1412  BP:    107/67  Pulse:    79  Resp:    18  Temp:      TempSrc:      SpO2: 93% 92% 95% 93%  Weight:      Height:        Examination:  General exam: [name] sitting up comfortably in bed this morning. Does not appear in any respiratory distress. Respiratory system: Improved breath sounds. Few crackles bilaterally but left > right base. No rhonchi or wheezing. No wheezing or rhonchi. Respiratory effort normal.  Cardiovascular system: S1 & S2 heard, RRR. No JVD, murmurs, rubs, gallops or clicks. No pedal edema. Telemetry: Sinus rhythm. Gastrointestinal system: Abdomen is nondistended, soft and nontender. No organomegaly or masses felt. Normal bowel sounds heard. Central nervous system: Alert and oriented. No focal neurological deficits. Extremities: Symmetric 5 x 5 power. Skin: No rashes, lesions or ulcers Psychiatry: Judgement and insight appear normal. Mood & affect appropriate.  Musculoskeletal system:? Mild right thoracic/mid scapular paraspinal area spasm/tenderness.    Data Reviewed: I have personally reviewed following labs and imaging studies  CBC:  Recent Labs Lab 01/14/17 2042 01/15/17 0346 01/16/17 0305 01/17/17 0438 01/18/17 0522 01/20/17 0702  WBC 5.1 3.9* 5.2 3.5* 5.7 5.5  NEUTROABS 3.2  --   --   --   --   --   HGB 14.9 12.6 12.3 12.3 11.8* 12.7  HCT 43.9 38.0 37.0 37.5 35.7* 38.3  MCV  84.7 85.6 85.6 85.8 85.0 85.7  PLT 153 141* 140* 143* 174 076   Basic Metabolic Panel:  Recent Labs Lab 01/14/17 2042 01/15/17 0346 01/19/17 0441 01/19/17 1349 01/20/17 0702  NA 127* 135 139 137 139  K 3.7 3.6 2.3* 3.0* 3.0*  CL 96* 102 102 101 104  CO2 19* 23 28 28 25   GLUCOSE 258* 323* 141* 219* 155*  BUN 11 11 <5* <5* 5*  CREATININE 0.57 0.63 0.55 0.55 0.44  CALCIUM 8.7* 8.2* 8.4* 8.5* 8.6*  MG  --   --  1.9  --  1.9  PHOS  --   --   --   --  3.5   Liver Function Tests:  Recent Labs Lab 01/14/17 2042  AST 29  ALT 23  ALKPHOS 164*  BILITOT 0.3  PROT 8.0  ALBUMIN 3.4*   Coagulation Profile:  Recent Labs Lab 01/14/17 2042  INR 0.98   CBG:  Recent Labs Lab 01/19/17 1246 01/19/17 1653 01/19/17 2135 01/20/17 0839 01/20/17 1221  GLUCAP 168* 186* 164* 130* 219*    Recent Results (from the past 240 hour(s))  Blood culture (routine x 2)     Status: None   Collection Time: 01/14/17  8:27 PM  Result Value Ref Range Status   Specimen Description BLOOD RIGHT ARM  Final   Special Requests   Final    BOTTLES DRAWN AEROBIC AND ANAEROBIC Blood Culture adequate volume   Culture NO GROWTH 5 DAYS  Final   Report Status 01/19/2017 FINAL  Final  Blood culture (routine x 2)     Status: None   Collection Time: 01/14/17  8:35 PM  Result Value Ref Range  Status   Specimen Description BLOOD LEFT ARM  Final   Special Requests IN PEDIATRIC BOTTLE Blood Culture adequate volume  Final   Culture NO GROWTH 5 DAYS  Final   Report Status 01/19/2017 FINAL  Final  Urine culture     Status: Abnormal   Collection Time: 01/14/17 10:50 PM  Result Value Ref Range Status   Specimen Description URINE, CLEAN CATCH  Final   Special Requests NONE  Final   Culture >=100,000 COLONIES/mL ESCHERICHIA COLI (A)  Final   Report Status 01/17/2017 FINAL  Final   Organism ID, Bacteria ESCHERICHIA COLI (A)  Final      Susceptibility   Escherichia coli - MIC*    AMPICILLIN >=32 RESISTANT  Resistant     CEFAZOLIN <=4 SENSITIVE Sensitive     CEFTRIAXONE <=1 SENSITIVE Sensitive     CIPROFLOXACIN <=0.25 SENSITIVE Sensitive     GENTAMICIN >=16 RESISTANT Resistant     IMIPENEM 1 SENSITIVE Sensitive     NITROFURANTOIN <=16 SENSITIVE Sensitive     TRIMETH/SULFA <=20 SENSITIVE Sensitive     AMPICILLIN/SULBACTAM 16 INTERMEDIATE Intermediate     PIP/TAZO <=4 SENSITIVE Sensitive     Extended ESBL NEGATIVE Sensitive     * >=100,000 COLONIES/mL ESCHERICHIA COLI  Culture, sputum-assessment     Status: None   Collection Time: 01/15/17  9:40 AM  Result Value Ref Range Status   Specimen Description EXPECTORATED SPUTUM  Final   Special Requests NONE  Final   Sputum evaluation   Final    Sputum specimen not acceptable for testing.  Please recollect.   Results Called to: Titus Dubin, RN AT [place] ON 01/16/17. Colin Rhein, MLT    Report Status 01/16/2017 FINAL  Final  Culture, expectorated sputum-assessment     Status: None   Collection Time: 01/16/17  5:43 PM  Result Value Ref Range Status   Specimen Description EXPECTORATED SPUTUM  Final   Special Requests NONE  Final   Sputum evaluation THIS SPECIMEN IS ACCEPTABLE FOR SPUTUM CULTURE  Final   Report Status 01/16/2017 FINAL  Final  Culture, respiratory (NON-Expectorated)     Status: None   Collection Time: 01/16/17  5:43 PM  Result Value Ref Range Status   Specimen Description EXPECTORATED SPUTUM  Final   Special Requests NONE Reflexed from [id number]  Final   Gram Stain   Final    ABUNDANT WBC PRESENT,BOTH PMN AND MONONUCLEAR RARE GRAM POSITIVE COCCI IN PAIRS RARE GRAM NEGATIVE RODS    Culture   Final    MODERATE STREPTOCOCCUS MITIS/ORALIS RARE CANDIDA ALBICANS PURE CULTURE OF THESE TWO ORGANISMS    Report Status 01/20/2017 FINAL  Final         Radiology Studies: Dg Chest 2 View  Result Date: 01/20/2017 CLINICAL DATA:  Hypoxia EXAM: CHEST  2 VIEW COMPARISON:  01/19/2017 FINDINGS: Airspace disease continues throughout the left  lung. Patchy opacities in the right lung. No real change since prior study. Heart is borderline in size. No effusions. IMPRESSION: Stable bilateral airspace disease, left greater than right. Electronically Signed   By: Rolm Baptise M.D.   On: 01/20/2017 08:15   Dg Chest 2 View  Result Date: 01/19/2017 CLINICAL DATA:  Fever cough and back pain for 1 week.  Hypoxia. EXAM: CHEST  2 VIEW COMPARISON:  01/18/2017 FINDINGS: Bilateral airspace opacities, left-greater-than-right, are unchanged. Cardiomediastinal silhouette is unchanged. No pleural effusion pneumothorax. This is a low volume film. IMPRESSION: Unchanged chest radiograph with bilateral airspace opacities, left-greater-than-right. Electronically Signed  By: Margarette Canada M.D.   On: 01/19/2017 12:26        Scheduled Meds: . benzonatate  200 mg Oral TID  . budesonide (PULMICORT) nebulizer solution  0.5 mg Nebulization BID  . enoxaparin (LOVENOX) injection  40 mg Subcutaneous Q24H  . furosemide  40 mg Intravenous BID  . insulin aspart  0-15 Units Subcutaneous TID WC  . insulin aspart  0-5 Units Subcutaneous QHS  . insulin glargine  15 Units Subcutaneous QHS  . ipratropium-albuterol  3 mL Nebulization QID  . oxymetazoline  1 spray Each Nare BID  . [START ON 01/21/2017] potassium chloride  40 mEq Oral BID   Continuous Infusions: . ceFEPime (MAXIPIME) IV Stopped (01/20/17 1342)     LOS: 6 days     HONGALGI,ANAND, MD, FACP, FHM. [facility] Pager [phone]  If 7PM-7AM, please contact night-coverage [email or web address] Password [email or web address] 01/20/2017, 3:41 PM

## 2017-01-20 NOTE — Progress Notes (Signed)
Name: Denise Mosley MRN: [id number] DOB: 1961/07/13    ADMISSION DATE:  01/14/2017 CONSULTATION DATE: 5/6  REFERRING MD :  [facility]  CHIEF COMPLAINT:  SOB   SIGNIFICANT EVENTS  5/6 increased fio2 needs  STUDIES:  CXR 5/7 > stable L > R airsace disease.   HISTORY OF PRESENT ILLNESS:   56 yo spanish female who does not speak english and was admitted 5/2 with BASDZ , CAP + flu a and strep mitis/oralis. Treated with vancomycin/cefepime/tamiflu. She actually was worse 5/6 and required 50% FM and PCCM was consulted. She was given lasix and has improved with diuresis. CxR is unchanged but does show significant BASDZ. If she doesn't respond to current treatment she may need to go to the unit. We will follow closely.   SUBJECTIVE:  Eating breakfast, visiting with close friend from church.  No distress.  Wants to get up and walk to the bathroom.   VITAL SIGNS: Temp:  [98.1 F (36.7 C)-98.3 F (36.8 C)] 98.3 F (36.8 C) (05/07 0523) Pulse Rate:  [80-86] 81 (05/07 0523) Resp:  [18-20] 18 (05/07 0523) BP: (104-111)/(54-63) 111/62 (05/07 0523) SpO2:  [92 %-100 %] 93 % (05/07 0819) FiO2 (%):  [50 %] 50 % (05/07 0819) Weight:  [81.3 kg (179 lb 3.7 oz)] 81.3 kg (179 lb 3.7 oz) (05/07 0523)  PHYSICAL EXAMINATION: General:  WNWD female, eating breakfast, no distress. Neuro:  Intact, no deficits. HEENT:  No jvd / lan. Cardiovascular:  RRR. Lungs:  Diminished bilateral bases. Abdomen:  Obese , soft +bs. Musculoskeletal:  Intact. Skin:  Warm and dry.   Recent Labs Lab 01/19/17 0441 01/19/17 1349 01/20/17 0702  NA 139 137 139  K 2.3* 3.0* 3.0*  CL 102 101 104  CO2 28 28 25   BUN <5* <5* 5*  CREATININE 0.55 0.55 0.44  GLUCOSE 141* 219* 155*    Recent Labs Lab 01/17/17 0438 01/18/17 0522 01/20/17 0702  HGB 12.3 11.8* 12.7  HCT 37.5 35.7* 38.3  WBC 3.5* 5.7 5.5  PLT 143* 174 243   Dg Chest 2 View  Result Date: 01/20/2017 CLINICAL DATA:  Hypoxia EXAM: CHEST  2 VIEW  COMPARISON:  01/19/2017 FINDINGS: Airspace disease continues throughout the left lung. Patchy opacities in the right lung. No real change since prior study. Heart is borderline in size. No effusions. IMPRESSION: Stable bilateral airspace disease, left greater than right. Electronically Signed   By: Charlett Nose M.D.   On: 01/20/2017 08:15   Dg Chest 2 View  Result Date: 01/19/2017 CLINICAL DATA:  Fever cough and back pain for 1 week.  Hypoxia. EXAM: CHEST  2 VIEW COMPARISON:  01/18/2017 FINDINGS: Bilateral airspace opacities, left-greater-than-right, are unchanged. Cardiomediastinal silhouette is unchanged. No pleural effusion pneumothorax. This is a low volume film. IMPRESSION: Unchanged chest radiograph with bilateral airspace opacities, left-greater-than-right. Electronically Signed   By: Harmon Pier M.D.   On: 01/19/2017 12:26   Dg Chest 2 View  Result Date: 01/18/2017 CLINICAL DATA:  Followup pneumonia. EXAM: CHEST  2 VIEW COMPARISON:  01/16/2017 FINDINGS: Mild cardiac enlargement. Multifocal airspace densities are identified throughout the left lung. Less severe multifocal patchy opacities are noted within the right upper lobe and right base. When compared with the previous exam aeration of both lungs appears diminished. IMPRESSION: 1. Persistent and progressive bilateral pneumonia. Electronically Signed   By: Signa Kell M.D.   On: 01/18/2017 13:26   Dg Thoracic Spine W/swimmers  Result Date: 01/18/2017 CLINICAL DATA:  Back pain. EXAM: THORACIC  SPINE - 3 VIEWS COMPARISON:  Radiographs of Jan 16, 2017. FINDINGS: No fracture or spondylolisthesis is noted. 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 IMPRESSION: Mild degenerative changes are noted in lower thoracic spine. No acute abnormality is noted. Electronically Signed   By: Lupita RaiderJames  Green Jr, M.D.   On: 01/18/2017 13:27    Discussion: 56 yo spanish female who does not speak  english and was admitted 5/1 with BASDZ , CAP + flu a and strep mitis/oralis. Treated with vancomycin/cefepime/tamiflu. She actually was worse 5/6 and required 50% FM and PCCM was consulted. She was given lasix and has improved with diuresis. CXR is unchanged but does show significant BASDZ (L > R).  ASSESSMENT / PLAN:  Acute respiratory failure with hypoxia (HCC) in setting of BASDZ, CAP, flu A. PLAN: Continue supplemental O2 as needed to maintain SpO2 > 92%. Continue with abx and tamiflu. Continue lasix, goal neg balance (currently +2.1L).  I have started 40mg  lasix BID (with 40mEq potassium daily BID supplementation). Pulmonary hygiene. Assess echo.   Rutherford Guysahul Desai, [place] - [facility] [place] [facility] Pager: [phone]  or [phone] 01/20/2017, 9:40 AM  Attending Note:  56 year old female with acute respiratory failure and hypoxemia due to flu A and CAP.  Patient was extubated and transferred out.  On exam, diffuse crackles L>R.  I reviewed CXR myself, infiltrate noted.  Discussed with PCCM-NP.  Hypoxemia:  - Titrate O2 for sat of 88-92%  - May transiently need home O2.  - 10 L HFNC  CAP:  - Continue abx   - F/u on cultures  Flu A  - Tamiflu  Pulmonary hygienes:  - IS per RT protocol  Pulmonary edema:  - Lasix ordered  - BMET  - Replace electrolytes as indicated  PCCM will follow  Patient seen and examined, agree with above note.  I dictated the care and orders written for this patient under my direction.  Alyson ReedyYacoub, Jelisa [place] G, MD [phone]

## 2017-01-20 NOTE — Progress Notes (Signed)
During night pt has 1 occurrence when pt desaturated to 88, wheezing present on auscultation, RT called to assess pt, PRN breathing tx given. Wheezing resolved after breathing treatment, pt saturation in 93-96% range.  Pt denied chest pain and SOB.

## 2017-01-21 ENCOUNTER — Inpatient Hospital Stay (HOSPITAL_COMMUNITY): Payer: Medicaid Other

## 2017-01-21 ENCOUNTER — Inpatient Hospital Stay: Payer: Self-pay

## 2017-01-21 DIAGNOSIS — J189 Pneumonia, unspecified organism: Secondary | ICD-10-CM

## 2017-01-21 DIAGNOSIS — Z9981 Dependence on supplemental oxygen: Secondary | ICD-10-CM

## 2017-01-21 DIAGNOSIS — I5031 Acute diastolic (congestive) heart failure: Secondary | ICD-10-CM

## 2017-01-21 LAB — BASIC METABOLIC PANEL
ANION GAP: 12 (ref 5–15)
BUN: 10 mg/dL (ref 6–20)
CO2: 26 mmol/L (ref 22–32)
Calcium: 9.1 mg/dL (ref 8.9–10.3)
Chloride: 101 mmol/L (ref 101–111)
Creatinine, Ser: 0.48 mg/dL (ref 0.44–1.00)
GFR calc Af Amer: >60 mL/min (ref >60)
Glucose, Bld: 160 mg/dL — ABNORMAL HIGH (ref 65–99)
POTASSIUM: 3.2 mmol/L — AB (ref 3.5–5.1)
SODIUM: 139 mmol/L (ref 135–145)

## 2017-01-21 LAB — ECHOCARDIOGRAM COMPLETE
HEIGHTINCHES: 60 in
Weight: 2811.31 oz

## 2017-01-21 LAB — GLUCOSE, CAPILLARY
GLUCOSE-CAPILLARY: 142 mg/dL — AB (ref 65–99)
GLUCOSE-CAPILLARY: 200 mg/dL — AB (ref 65–99)
GLUCOSE-CAPILLARY: 105 mg/dL — AB (ref 65–99)
GLUCOSE-CAPILLARY: 163 mg/dL — AB (ref 65–99)

## 2017-01-21 MED ORDER — POTASSIUM CHLORIDE CRYS ER 20 MEQ PO TBCR
40.0000 meq | EXTENDED_RELEASE_TABLET | Freq: Two times a day (BID) | ORAL | Status: AC
  Administered 2017-01-21 – 2017-01-22 (×4): 40 meq via ORAL
  Filled 2017-01-21 (×4): qty 2

## 2017-01-21 NOTE — Progress Notes (Addendum)
Pharmacy Antibiotic Note Denise Mosley is a 56 y.o. female admitted on 01/14/2017 with influenza A,  pneumonia. Initially started on ceftriaxone and azithromycin but with minimal improvement was broadened to Cefepime and vancomycin. Vancomycin d/c'd on 5/7 and continuing cefepime or PO levaquin to complete 10 day course per ID recommendations. Completed tamiflu. Afebrile, WBC wnl. Renal function stable.   Plan: -Cefepime 1 gm IV every 8 hours -F/u renal function, clinical progress -Complete 10 days total therapy with cefepime or switch to Levaquin PO per ID  Height: 5' (152.4 cm) Weight: 175 lb 11.3 oz (79.7 kg) IBW/kg (Calculated) : 45.5  Temp (24hrs), Avg:98.9 F (37.2 C), Min:98.4 F (36.9 C), Max:99.4 F (37.4 C)   Recent Labs Lab 01/14/17 2104 01/15/17 0346 01/16/17 0305 01/17/17 0438 01/18/17 0522 01/19/17 0441 01/19/17 1349 01/20/17 0702 01/21/17 0619  WBC  --  3.9* 5.2 3.5* 5.7  --   --  5.5  --   CREATININE  --  0.63  --   --   --  0.55 0.55 0.44 0.48  LATICACIDVEN 1.41  --   --   --   --   --   --   --   --     Estimated Creatinine Clearance: 76 mL/min (by C-G formula based on SCr of 0.48 mg/dL).    No Known Allergies  Antimicrobials this admission:  5/5 cefepime >>   5/5 vancomycin >> 5/7 5/5 Levaquin x 1 dose 5/1 Ceftriaxone 5/3 5/1 Azithromycin  5/2 5/2 Tamiflu > 5/7   Microbiology results: 5/1 BCx: neg 5/1 UCx: E.coli (asxs) 5/1 Strep pna: negative 5/1 Legionella: negative  5/2 Influenza A positive 5/2 HIV negative  5/3 Sputum: strep mitis, rare candida albicans (ID suspects contaminant)  Babs BertinHaley Shelley Pooley, PharmD, BCPS Clinical Pharmacist 01/21/2017 3:49 PM

## 2017-01-21 NOTE — Progress Notes (Signed)
Name: Natesha Hassey MRN: [id number] DOB: 08-28-1961    ADMISSION DATE:  01/14/2017 CONSULTATION DATE: 5/6  REFERRING MD :  [facility]  CHIEF COMPLAINT:  SOB   SIGNIFICANT EVENTS  5/6 increased fio2 needs  STUDIES:  CXR 5/7 > stable L > R airsace disease.  HISTORY OF PRESENT ILLNESS:   56 yo spanish female who does not speak english and was admitted 5/2 with BASDZ , CAP + flu a and strep mitis/oralis. Treated with vancomycin/cefepime/tamiflu. She actually was worse 5/6 and required 50% FM and PCCM was consulted. She was given lasix and has improved with diuresis. CxR is unchanged but does show significant BASDZ. If she doesn't respond to current treatment she may need to go to the unit. We will follow closely.  SUBJECTIVE:  Currently receiving pulmicort neb.  Was previously on 5L HF [place] with SpO2 high 80's.  VITAL SIGNS: Temp:  [98.4 F (36.9 C)-99.4 F (37.4 C)] 99.4 F (37.4 C) (05/08 0444) Pulse Rate:  [70-91] 91 (05/08 0444) Resp:  [16-20] 20 (05/08 0444) BP: (96-107)/(58-67) 96/58 (05/08 0444) SpO2:  [90 %-95 %] 90 % (05/08 0947) Weight:  [79.7 kg (175 lb 11.3 oz)] 79.7 kg (175 lb 11.3 oz) (05/08 0545)  PHYSICAL EXAMINATION: General:  WNWD female, visiting with friend, no distress. Neuro:  Intact, no deficits. HEENT:  No jvd / lan. Cardiovascular:  RRR. Lungs:  Diminished bilateral bases. Abdomen:  Obese , soft +bs. Musculoskeletal:  Intact. Skin:  Warm and dry.   Recent Labs Lab 01/19/17 1349 01/20/17 0702 01/21/17 0619  NA 137 139 139  K 3.0* 3.0* 3.2*  CL 101 104 101  CO2 28 25 26   BUN <5* 5* 10  CREATININE 0.55 0.44 0.48  GLUCOSE 219* 155* 160*    Recent Labs Lab 01/17/17 0438 01/18/17 0522 01/20/17 0702  HGB 12.3 11.8* 12.7  HCT 37.5 35.7* 38.3  WBC 3.5* 5.7 5.5  PLT 143* 174 243   Dg Chest 2 View  Result Date: 01/20/2017 CLINICAL DATA:  Hypoxia EXAM: CHEST  2 VIEW COMPARISON:  01/19/2017 FINDINGS: Airspace disease continues throughout the  left lung. Patchy opacities in the right lung. No real change since prior study. Heart is borderline in size. No effusions. IMPRESSION: Stable bilateral airspace disease, left greater than right. Electronically Signed   By: Charlett Nose M.D.   On: 01/20/2017 08:15   Dg Chest 2 View  Result Date: 01/19/2017 CLINICAL DATA:  Fever cough and back pain for 1 week.  Hypoxia. EXAM: CHEST  2 VIEW COMPARISON:  01/18/2017 FINDINGS: Bilateral airspace opacities, left-greater-than-right, are unchanged. Cardiomediastinal silhouette is unchanged. No pleural effusion pneumothorax. This is a low volume film. IMPRESSION: Unchanged chest radiograph with bilateral airspace opacities, left-greater-than-right. Electronically Signed   By: Harmon Pier M.D.   On: 01/19/2017 12:26    Discussion: 56 yo spanish female who does not speak english and was admitted 5/1 with BASDZ , CAP + flu a and strep mitis/oralis. Treated with vancomycin/cefepime/tamiflu. She actually was worse 5/6 and required 50% FM and PCCM was consulted. She was given lasix and has improved with diuresis. CXR is unchanged but does show significant BASDZ (L > R).  ASSESSMENT / PLAN:  Acute respiratory failure with hypoxia (HCC) in setting of BASDZ, CAP, flu A.  S/p tamiflu x 5 days. PLAN: Continue supplemental O2 as needed to maintain SpO2 > 92%. Needs ambulatory desat study prior to d/c to assess for home O2 needs. Continue with cefepime. Continue lasix, goal neg  balance (currently +2.1L).  I have started 40mg  lasix BID (with 40mEq potassium daily BID supplementation). Pulmonary hygiene. Assess echo.   Rutherford Guysahul Desai, [place] Sidonie Dickens- [facility] [place] [facility] Pager: [phone]  or [phone] 01/21/2017, 10:06 AM  Attending Note:  56 year old female with acute respiratory failure and hypoxemia due to flu A and CAP.  Patient was extubated and transferred out of the ICU but remains with very high O2 need.  ON exam diffuse crackles  L>R.  I reviewed CXR myself, infiltrate noted.  Discussed with PCCM-NP.  Hypoxemia:             - Titrate O2 for sat of 88-92%             - Suspect will need home O2 transiently.             - Down to 5 L HFNC  CAP:             - Continue abx for now             - F/u on cultures  Flu A             - Tamiflu complete  Pulmonary hygienes:             - IS per RT protocol  Pulmonary edema:             - Continue lasix as ordered             - BMET in AM             - Replace electrolytes as indicated  PCCM will follow  Patient seen and examined, agree with above note.  I dictated the care and orders written for this patient under my direction.  Alyson ReedyYacoub, Ashonte Angelucci G, MD [phone]

## 2017-01-21 NOTE — Progress Notes (Signed)
INFECTIOUS DISEASE PROGRESS NOTE  ID: Denise Mosley is a 56 y.o. female with  Principal Problem:   CAP (community acquired pneumonia) Active Problems:   Hyponatremia   Dehydration   Diabetes mellitus type 2, controlled (HCC)   Acute respiratory failure with hypoxia (HCC) in setting of BASDZ, CAP   Flu   Acute pulmonary edema (HCC)   Atelectasis  Subjective: Feels close to normal, breathing improved. Cough productive of clear sputum is improving. She denies any fevers, chills, or diaphoresis. She is still requiring supplemental O2.  Abtx:  Anti-infectives    Start     Dose/Rate Route Frequency Ordered Stop   01/19/17 0500  vancomycin (VANCOCIN) 1,250 mg in sodium chloride 0.9 % 250 mL IVPB  Status:  Discontinued     1,250 mg 166.7 mL/hr over 90 Minutes Intravenous Every 12 hours 01/18/17 1429 01/20/17 1154   01/18/17 1430  ceFEPIme (MAXIPIME) 1 g in dextrose 5 % 50 mL IVPB     1 g 100 mL/hr over 30 Minutes Intravenous Every 8 hours 01/18/17 1428     01/18/17 1430  vancomycin (VANCOCIN) 1,500 mg in sodium chloride 0.9 % 500 mL IVPB     1,500 mg 250 mL/hr over 120 Minutes Intravenous  Once 01/18/17 1428 01/18/17 1731   01/18/17 1000  levofloxacin (LEVAQUIN) tablet 500 mg  Status:  Discontinued     500 mg Oral Daily 01/17/17 1110 01/18/17 1400   01/15/17 2230  oseltamivir (TAMIFLU) capsule 75 mg     75 mg Oral 2 times daily 01/15/17 2201 01/20/17 0930   01/15/17 2200  cefTRIAXone (ROCEPHIN) 1 g in dextrose 5 % 50 mL IVPB  Status:  Discontinued     1 g 100 mL/hr over 30 Minutes Intravenous Every 24 hours 01/15/17 0314 01/17/17 1110   01/15/17 2000  azithromycin (ZITHROMAX) 500 mg in dextrose 5 % 250 mL IVPB  Status:  Discontinued     500 mg 250 mL/hr over 60 Minutes Intravenous Every 24 hours 01/15/17 0314 01/16/17 1341   01/14/17 2215  cefTRIAXone (ROCEPHIN) 1 g in dextrose 5 % 50 mL IVPB     1 g 100 mL/hr over 30 Minutes Intravenous  Once 01/14/17 2212 01/14/17 2342   01/14/17 2215  azithromycin (ZITHROMAX) tablet 500 mg     500 mg Oral  Once 01/14/17 2212 01/14/17 2312      Medications:  Scheduled: . benzonatate  200 mg Oral TID  . budesonide (PULMICORT) nebulizer solution  0.5 mg Nebulization BID  . enoxaparin (LOVENOX) injection  40 mg Subcutaneous Q24H  . furosemide  40 mg Intravenous BID  . insulin aspart  0-15 Units Subcutaneous TID WC  . insulin aspart  0-5 Units Subcutaneous QHS  . insulin glargine  15 Units Subcutaneous QHS  . potassium chloride  40 mEq Oral BID    Objective: Vital signs in last 24 hours: Temp:  [98.4 F (36.9 C)-99.4 F (37.4 C)] 99.4 F (37.4 C) (05/08 0444) Pulse Rate:  [70-91] 91 (05/08 0444) Resp:  [16-20] 20 (05/08 0444) BP: (96-107)/(58-67) 96/58 (05/08 0444) SpO2:  [90 %-95 %] 90 % (05/08 0947) Weight:  [175 lb 11.3 oz (79.7 kg)] 175 lb 11.3 oz (79.7 kg) (05/08 0545)   General: resting in bed, no acute distress, on supplemental O2 Cardiac: RRR, no rubs, murmurs or gallops Pulm: faint basilar crackles Abd: soft, nontender, nondistended, BS present Ext: warm and well perfused, no pedal edema Neuro: alert and oriented X3    Lab  Results  Recent Labs  01/20/17 0702 01/21/17 0619  WBC 5.5  --   HGB 12.7  --   HCT 38.3  --   NA 139 139  K 3.0* 3.2*  CL 104 101  CO2 25 26  BUN 5* 10  CREATININE 0.44 0.48   Liver Panel No results for input(s): PROT, ALBUMIN, AST, ALT, ALKPHOS, BILITOT, BILIDIR, IBILI in the last 72 hours. Sedimentation Rate No results for input(s): ESRSEDRATE in the last 72 hours. C-Reactive Protein No results for input(s): CRP in the last 72 hours.  Microbiology: Recent Results (from the past 240 hour(s))  Blood culture (routine x 2)     Status: None   Collection Time: 01/14/17  8:27 PM  Result Value Ref Range Status   Specimen Description BLOOD RIGHT ARM  Final   Special Requests   Final    BOTTLES DRAWN AEROBIC AND ANAEROBIC Blood Culture adequate volume   Culture NO  GROWTH 5 DAYS  Final   Report Status 01/19/2017 FINAL  Final  Blood culture (routine x 2)     Status: None   Collection Time: 01/14/17  8:35 PM  Result Value Ref Range Status   Specimen Description BLOOD LEFT ARM  Final   Special Requests IN PEDIATRIC BOTTLE Blood Culture adequate volume  Final   Culture NO GROWTH 5 DAYS  Final   Report Status 01/19/2017 FINAL  Final  Urine culture     Status: Abnormal   Collection Time: 01/14/17 10:50 PM  Result Value Ref Range Status   Specimen Description URINE, CLEAN CATCH  Final   Special Requests NONE  Final   Culture >=100,000 COLONIES/mL ESCHERICHIA COLI (A)  Final   Report Status 01/17/2017 FINAL  Final   Organism ID, Bacteria ESCHERICHIA COLI (A)  Final      Susceptibility   Escherichia coli - MIC*    AMPICILLIN >=32 RESISTANT Resistant     CEFAZOLIN <=4 SENSITIVE Sensitive     CEFTRIAXONE <=1 SENSITIVE Sensitive     CIPROFLOXACIN <=0.25 SENSITIVE Sensitive     GENTAMICIN >=16 RESISTANT Resistant     IMIPENEM 1 SENSITIVE Sensitive     NITROFURANTOIN <=16 SENSITIVE Sensitive     TRIMETH/SULFA <=20 SENSITIVE Sensitive     AMPICILLIN/SULBACTAM 16 INTERMEDIATE Intermediate     PIP/TAZO <=4 SENSITIVE Sensitive     Extended ESBL NEGATIVE Sensitive     * >=100,000 COLONIES/mL ESCHERICHIA COLI  Culture, sputum-assessment     Status: None   Collection Time: 01/15/17  9:40 AM  Result Value Ref Range Status   Specimen Description EXPECTORATED SPUTUM  Final   Special Requests NONE  Final   Sputum evaluation   Final    Sputum specimen not acceptable for testing.  Please recollect.   Results Called to: Sharmon Revere, RN AT 1535 ON 01/16/17. Zachery Conch, MLT    Report Status 01/16/2017 FINAL  Final  Culture, expectorated sputum-assessment     Status: None   Collection Time: 01/16/17  5:43 PM  Result Value Ref Range Status   Specimen Description EXPECTORATED SPUTUM  Final   Special Requests NONE  Final   Sputum evaluation THIS SPECIMEN IS ACCEPTABLE  FOR SPUTUM CULTURE  Final   Report Status 01/16/2017 FINAL  Final  Culture, respiratory (NON-Expectorated)     Status: None   Collection Time: 01/16/17  5:43 PM  Result Value Ref Range Status   Specimen Description EXPECTORATED SPUTUM  Final   Special Requests NONE Reflexed from [id number]  Final  Gram Stain   Final    ABUNDANT WBC PRESENT,BOTH PMN AND MONONUCLEAR RARE GRAM POSITIVE COCCI IN PAIRS RARE GRAM NEGATIVE RODS    Culture   Final    MODERATE STREPTOCOCCUS MITIS/ORALIS RARE CANDIDA ALBICANS PURE CULTURE OF THESE TWO ORGANISMS    Report Status 01/20/2017 FINAL  Final    Studies/Results: Dg Chest 2 View  Result Date: 01/20/2017 CLINICAL DATA:  Hypoxia EXAM: CHEST  2 VIEW COMPARISON:  01/19/2017 FINDINGS: Airspace disease continues throughout the left lung. Patchy opacities in the right lung. No real change since prior study. Heart is borderline in size. No effusions. IMPRESSION: Stable bilateral airspace disease, left greater than right. Electronically Signed   By: Charlett Nose M.D.   On: 01/20/2017 08:15   Dg Chest 2 View  Result Date: 01/19/2017 CLINICAL DATA:  Fever cough and back pain for 1 week.  Hypoxia. EXAM: CHEST  2 VIEW COMPARISON:  01/18/2017 FINDINGS: Bilateral airspace opacities, left-greater-than-right, are unchanged. Cardiomediastinal silhouette is unchanged. No pleural effusion pneumothorax. This is a low volume film. IMPRESSION: Unchanged chest radiograph with bilateral airspace opacities, left-greater-than-right. Electronically Signed   By: Harmon Pier M.D.   On: 01/19/2017 12:26     Assessment/Plan: CAP Influenza A Strep mitis and Candida in sputum (suspect oral contaminant)  Total days of antibiotics: 7 (oseltamivir 7, vanco/cefepime 3) Completed Oseltamavir course. Vancomycin d/c'ed yesterday.  Continue to Cefepime until day 10 or discharge, otherwise can change to Levaquin if discharged before day 10 to complete a 10 day total course.         Darreld Mclean Internal Medicine PGY-2 [email or web address] 01/21/2017, 12:01 PM  LOS: 7 days

## 2017-01-21 NOTE — Progress Notes (Signed)
MD was informed about patient BP=98/59. He said that iti is OK to give the scheduled Lasix IV this evening. Will continue to monitor.

## 2017-01-21 NOTE — Progress Notes (Signed)
Patient on 7L HFNC, her sat O2 was 88%. Staff increased at 9L with satO2 - 91%. MD made aware. Will continue to monitor.

## 2017-01-21 NOTE — Progress Notes (Signed)
  Echocardiogram 2D Echocardiogram has been performed.  Nolon RodBrown, Tony 01/21/2017, 12:35 PM

## 2017-01-21 NOTE — Progress Notes (Addendum)
PROGRESS NOTE   Denise Mosley  JOA:[id number]    DOB: Oct 21, 1960    DOA: 01/14/2017  PCP: Patient, No Pcp Per   I have briefly reviewed patients previous medical records in [facility].  Brief Narrative:  [name], lives with adult son, does not have a PCP, IADL, PMH of type II DM, HTN, former smoker, chronic back pain, presented to ED with 3 days history of productive cough, dyspnea, weakness, poor appetite and weakness. In ED, fever of 103.1, tachycardic 113/m, transient hypoxia 87-88 percent, normal lactate, sodium 127 and chest x-ray suggestive of lobar pneumonia. Admitted for community-acquired pneumonia.Flu confirmed. Despite appropriate antibiotics (ceftriaxone, azithromycin > levofloxacin), worsening clinically and radiologically. ID and CCM consulted.Slowly improving.   Assessment & Plan:   Principal Problem:   CAP (community acquired pneumonia) Active Problems:   Hyponatremia   Dehydration   Diabetes mellitus type 2, controlled ([place])   Acute respiratory failure with hypoxia (HCC) in setting of BASDZ, CAP   Influenza due to identified novel influenza A virus with other respiratory manifestations   Acute pulmonary edema (HCC)   Atelectasis   Pneumonia of both lungs due to infectious organism   1. Community-acquired pneumonia complicating influenza A: HIV antibody, urine Legionella and pneumococcal antigen: Negative, flu panel PCR: Positive for influenza A, Blood cultures 2: Negative. Despite appropriate antimicrobial regimen (Tamiflu, IV Rocephin/azithromycin >levofloxacin), continued to worsen clinically and radiologically.So broadened antibiotic coverage to vancomycin, cefepime and azithromycin, ID consult appreciated, vancomycin has been stopped yesterday, to continue with cefepime for total of 10 days(or can be transitioned to by mouth Levaquin if she is discharged) , sputum culture growing Streptococcus Mitis and Candida ,most likely related  to contamination, 2. Influenza A: Currently afebrile, treated with Tamiflu 3. Sepsis secondary to pneumonia & influenza: Met sepsis criteria on admission. Sepsis physiology have improved.  4. Acute diastolic CHF: This is most likely contributing to respiratory failure, 2-D echo with grade 1 diastolic dysfunction and preserved EF, continue with Lasix 5. Acute respiratory failure with hypoxia: Progressive hypoxia despite treatment for pneumonia and flu. Suspect due to multilobar pneumonia, influenza and? Pulmonary edema. Continues to improve, today on 5 L nasal cannula, continue with the pneumonia treatment and diuresis.  6. Dehydration with hyponatremia: Resolved. 7. Uncontrolled type II DM: A1c 12.3 suggesting poor outpatient control. Home metformin on hold. Needs close outpatient follow-up with a new PCP. Discussed with patient and she verbalizes understanding. Continue Lantus 15 units QHS and continue SSI. Better. Patient willing to do insulins at home. As discussed with case management, they will be able to assist with insulin at discharge > consider Lantus/SSI (if CM can assist) Vs 70/30 insulin at DC. Will consult nutrition service for by 2-D location 8. Anemia and thrombocytopenia: CBC has normalized on 4/7. 9. Essential hypertension: Controlled. Currently not on antihypertensives. 10. Chronic back pain:, worsened somewhat due to coughing spells. Treat supportively. Due to persistent complain of back pain, obtained T-spine x-ray which showed no acute findings. No acute findings on local exam. Improved. 11. Asymptomatic bacteriuria/Escherichia coli: Sensitive anyway to ceftriaxone & levofloxacin Hypokalemia: Repleted, recheck in a.m.  DVT prophylaxis: Lovenox Code Status: Full Family Communication: Discussed in detail with patient's sister at bedside.  Disposition: DC home when medically stable, (when  her hypoxia resolves)   Consultants:  Infectious disease Pulmonology  Procedures:    None  Antimicrobials:  IV ceftriaxone and azithromycin -discontinued Tamiflu Oral levofloxacin 5/4 > discontinued IV cefepime > IV vancomycin >discontinued   Subjective:  Patient reports her dyspnea has improved, fever resolved, cough has improved as well.   Objective:  Vitals:   01/20/17 2205 01/21/17 0444 01/21/17 0545 01/21/17 0947  BP: 102/63 (!) 96/58    Pulse: 75 91    Resp: 16 20    Temp: 98.4 F (36.9 C) 99.4 F (37.4 C)    TempSrc: Oral Oral    SpO2: 91% 90%  90%  Weight:   79.7 kg (175 lb 11.3 oz)   Height:        Examination:  General exam: [name] sitting up comfortably in bed this morning. Does not appear in any respiratory distress. Respiratory system: Good air entry bilaterally, scattered crackles, no wheezing, no use of accessory muscle   Cardiovascular system: S1 & S2 heard, RRR. No JVD, no rubs murmurs gallops, no pedal edema  Gastrointestinal system: Abdomen is nondistended, soft and nontender. No organomegaly or masses felt. Normal bowel sounds heard. Central nervous system: Alert and oriented. No focal neurological deficits. Extremities: Symmetric 5 x 5 power. Skin: No rashes, lesions or ulcers Psychiatry: Judgement and insight appear normal. Mood & affect appropriate.      Data Reviewed: I have personally reviewed following labs and imaging studies  CBC:  Recent Labs Lab 01/14/17 2042 01/15/17 0346 01/16/17 0305 01/17/17 0438 01/18/17 0522 01/20/17 0702  WBC 5.1 3.9* 5.2 3.5* 5.7 5.5  NEUTROABS 3.2  --   --   --   --   --   HGB 14.9 12.6 12.3 12.3 11.8* 12.7  HCT 43.9 38.0 37.0 37.5 35.7* 38.3  MCV 84.7 85.6 85.6 85.8 85.0 85.7  PLT 153 141* 140* 143* 174 400   Basic Metabolic Panel:  Recent Labs Lab 01/15/17 0346 01/19/17 0441 01/19/17 1349 01/20/17 0702 01/21/17 0619  NA 135 139 137 139 139  K 3.6 2.3* 3.0* 3.0* 3.2*  CL 102 102 101 104 101  CO2 23 28 28 25 26   GLUCOSE 323* 141* 219* 155* 160*   BUN 11 <5* <5* 5* 10  CREATININE 0.63 0.55 0.55 0.44 0.48  CALCIUM 8.2* 8.4* 8.5* 8.6* 9.1  MG  --  1.9  --  1.9  --   PHOS  --   --   --  3.5  --    Liver Function Tests:  Recent Labs Lab 01/14/17 2042  AST 29  ALT 23  ALKPHOS 164*  BILITOT 0.3  PROT 8.0  ALBUMIN 3.4*   Coagulation Profile:  Recent Labs Lab 01/14/17 2042  INR 0.98   CBG:  Recent Labs Lab 01/20/17 1221 01/20/17 1720 01/20/17 2206 01/21/17 0800 01/21/17 1200  GLUCAP 219* 155* 163* 142* 200*    Recent Results (from the past 240 hour(s))  Blood culture (routine x 2)     Status: None   Collection Time: 01/14/17  8:27 PM  Result Value Ref Range Status   Specimen Description BLOOD RIGHT ARM  Final   Special Requests   Final    BOTTLES DRAWN AEROBIC AND ANAEROBIC Blood Culture adequate volume   Culture NO GROWTH 5 DAYS  Final   Report Status 01/19/2017 FINAL  Final  Blood culture (routine x 2)     Status: None   Collection Time: 01/14/17  8:35 PM  Result Value Ref Range Status   Specimen Description BLOOD LEFT ARM  Final   Special Requests IN PEDIATRIC BOTTLE Blood Culture adequate volume  Final   Culture NO GROWTH 5 DAYS  Final   Report Status 01/19/2017  FINAL  Final  Urine culture     Status: Abnormal   Collection Time: 01/14/17 10:50 PM  Result Value Ref Range Status   Specimen Description URINE, CLEAN CATCH  Final   Special Requests NONE  Final   Culture >=100,000 COLONIES/mL ESCHERICHIA COLI (A)  Final   Report Status 01/17/2017 FINAL  Final   Organism ID, Bacteria ESCHERICHIA COLI (A)  Final      Susceptibility   Escherichia coli - MIC*    AMPICILLIN >=32 RESISTANT Resistant     CEFAZOLIN <=4 SENSITIVE Sensitive     CEFTRIAXONE <=1 SENSITIVE Sensitive     CIPROFLOXACIN <=0.25 SENSITIVE Sensitive     GENTAMICIN >=16 RESISTANT Resistant     IMIPENEM 1 SENSITIVE Sensitive     NITROFURANTOIN <=16 SENSITIVE Sensitive     TRIMETH/SULFA <=20 SENSITIVE Sensitive     AMPICILLIN/SULBACTAM  16 INTERMEDIATE Intermediate     PIP/TAZO <=4 SENSITIVE Sensitive     Extended ESBL NEGATIVE Sensitive     * >=100,000 COLONIES/mL ESCHERICHIA COLI  Culture, sputum-assessment     Status: None   Collection Time: 01/15/17  9:40 AM  Result Value Ref Range Status   Specimen Description EXPECTORATED SPUTUM  Final   Special Requests NONE  Final   Sputum evaluation   Final    Sputum specimen not acceptable for testing.  Please recollect.   Results Called to: Titus Dubin, RN AT [facility] ON 01/16/17. Colin Rhein, MLT    Report Status 01/16/2017 FINAL  Final  Culture, expectorated sputum-assessment     Status: None   Collection Time: 01/16/17  5:43 PM  Result Value Ref Range Status   Specimen Description EXPECTORATED SPUTUM  Final   Special Requests NONE  Final   Sputum evaluation THIS SPECIMEN IS ACCEPTABLE FOR SPUTUM CULTURE  Final   Report Status 01/16/2017 FINAL  Final  Culture, respiratory (NON-Expectorated)     Status: None   Collection Time: 01/16/17  5:43 PM  Result Value Ref Range Status   Specimen Description EXPECTORATED SPUTUM  Final   Special Requests NONE Reflexed from [id number]  Final   Gram Stain   Final    ABUNDANT WBC PRESENT,BOTH PMN AND MONONUCLEAR RARE GRAM POSITIVE COCCI IN PAIRS RARE GRAM NEGATIVE RODS    Culture   Final    MODERATE STREPTOCOCCUS MITIS/ORALIS RARE CANDIDA ALBICANS PURE CULTURE OF THESE TWO ORGANISMS    Report Status 01/20/2017 FINAL  Final         Radiology Studies: Dg Chest 2 View  Result Date: 01/20/2017 CLINICAL DATA:  Hypoxia EXAM: CHEST  2 VIEW COMPARISON:  01/19/2017 FINDINGS: Airspace disease continues throughout the left lung. Patchy opacities in the right lung. No real change since prior study. Heart is borderline in size. No effusions. IMPRESSION: Stable bilateral airspace disease, left greater than right. Electronically Signed   By: Rolm Baptise M.D.   On: 01/20/2017 08:15        Scheduled Meds: . benzonatate  200 mg Oral TID  .  budesonide (PULMICORT) nebulizer solution  0.5 mg Nebulization BID  . enoxaparin (LOVENOX) injection  40 mg Subcutaneous Q24H  . furosemide  40 mg Intravenous BID  . insulin aspart  0-15 Units Subcutaneous TID WC  . insulin aspart  0-5 Units Subcutaneous QHS  . insulin glargine  15 Units Subcutaneous QHS  . potassium chloride  40 mEq Oral BID   Continuous Infusions: . ceFEPime (MAXIPIME) IV 1 g (01/21/17 0530)     LOS: 7 days  Phillips Climes, MD, [facility] Pager [phone]  If 7PM-7AM, please contact night-coverage [email or web address] Password [facility] 01/21/2017, 12:31 PM

## 2017-01-22 ENCOUNTER — Inpatient Hospital Stay (HOSPITAL_COMMUNITY): Payer: Medicaid Other

## 2017-01-22 DIAGNOSIS — E1165 Type 2 diabetes mellitus with hyperglycemia: Secondary | ICD-10-CM

## 2017-01-22 DIAGNOSIS — IMO0002 Reserved for concepts with insufficient information to code with codable children: Secondary | ICD-10-CM

## 2017-01-22 DIAGNOSIS — E118 Type 2 diabetes mellitus with unspecified complications: Secondary | ICD-10-CM

## 2017-01-22 DIAGNOSIS — R739 Hyperglycemia, unspecified: Secondary | ICD-10-CM

## 2017-01-22 LAB — GLUCOSE, CAPILLARY
GLUCOSE-CAPILLARY: 172 mg/dL — AB (ref 65–99)
GLUCOSE-CAPILLARY: 192 mg/dL — AB (ref 65–99)
Glucose-Capillary: 179 mg/dL — ABNORMAL HIGH (ref 65–99)
Glucose-Capillary: 191 mg/dL — ABNORMAL HIGH (ref 65–99)

## 2017-01-22 LAB — CBC
HCT: 42.4 % (ref 36.0–46.0)
Hemoglobin: 14.3 g/dL (ref 12.0–15.0)
MCH: 28.8 pg (ref 26.0–34.0)
MCHC: 33.7 g/dL (ref 30.0–36.0)
MCV: 85.5 fL (ref 78.0–100.0)
PLATELETS: 345 10*3/uL (ref 150–400)
RBC: 4.96 MIL/uL (ref 3.87–5.11)
RDW: 12.9 % (ref 11.5–15.5)
WBC: 6.7 10*3/uL (ref 4.0–10.5)

## 2017-01-22 LAB — BASIC METABOLIC PANEL
ANION GAP: 11 (ref 5–15)
BUN: 10 mg/dL (ref 6–20)
CALCIUM: 9.2 mg/dL (ref 8.9–10.3)
CO2: 26 mmol/L (ref 22–32)
Chloride: 99 mmol/L — ABNORMAL LOW (ref 101–111)
Creatinine, Ser: 0.49 mg/dL (ref 0.44–1.00)
Glucose, Bld: 172 mg/dL — ABNORMAL HIGH (ref 65–99)
Potassium: 3.6 mmol/L (ref 3.5–5.1)
SODIUM: 136 mmol/L (ref 135–145)

## 2017-01-22 NOTE — Progress Notes (Signed)
Name: Denise Mosley MRN: [id number] DOB: Apr 27, 1961    ADMISSION DATE:  01/14/2017 CONSULTATION DATE:  01/19/2017  REFERRING MD :  Dr. Laural BenesJohnson   CHIEF COMPLAINT:  Dyspnea, increased oxygen requirements   BRIEF PATIENT DESCRIPTION:  56 yo non-english speaking spanish female with PMH of DM, HTN, former smoker, and chronic back pain.  Admitted 5/2 with BASDZ , CAP + flu a and strep mitis/oralis. Treated with vancomycin/cefepime/tamiflu. Continued hypoxia and increase oxygen requirements.   SIGNIFICANT EVENTS  5/1 > Admission  5/6 > PCCM consulted   STUDIES:  CXR 5/7 > stable L > R airsace disease  SUBJECTIVE:  Overnight desaturation to 88% > increased to 9L [place] now 93-97%   VITAL SIGNS: Temp:  [98.5 F (36.9 C)-99.1 F (37.3 C)] 98.5 F (36.9 C) (05/09 0511) Pulse Rate:  [71-79] 71 (05/09 0511) Resp:  [18-20] 18 (05/09 0511) BP: (91-100)/(54-60) 91/60 (05/09 0511) SpO2:  [88 %-100 %] 93 % (05/09 0756) Weight:  [77.4 kg (170 lb 10.2 oz)] 77.4 kg (170 lb 10.2 oz) (05/09 0500)  PHYSICAL EXAMINATION: General:  Chronically ill appearing female, NAD Neuro:  Awake and interactive, moving all ext to command HEENT:  [place]/AT, PERRL, EOM-I and MMM Cardiovascular:  RRR, Nl S1/S2, -M/R/G. Lungs:  Diffuse rales noted Abdomen:  Soft, NT, ND and +BS Musculoskeletal:  -edema and -tenderness Skin:  Intact   Recent Labs Lab 01/20/17 0702 01/21/17 0619 01/22/17 0804  NA 139 139 136  K 3.0* 3.2* 3.6  CL 104 101 99*  CO2 25 26 26   BUN 5* 10 10  CREATININE 0.44 0.48 0.49  GLUCOSE 155* 160* 172*    Recent Labs Lab 01/18/17 0522 01/20/17 0702 01/22/17 0804  HGB 11.8* 12.7 14.3  HCT 35.7* 38.3 42.4  WBC 5.7 5.5 6.7  PLT 174 243 345   Dg Chest Port 1 View  Result Date: 01/22/2017 CLINICAL DATA:  Shortness of breath and cough for the past 2 days. Former smoker. History of previous pneumonia. EXAM: PORTABLE CHEST 1 VIEW COMPARISON:  PA and lateral chest x-ray of Jan 20, 2017 and  June 27, 2014. FINDINGS: There persistent patchy alveolar opacities bilaterally greatest on the left. There is no pleural effusion. The heart and pulmonary vascularity are normal. There is calcification in the wall of the aortic arch. The observed bony thorax is unremarkable. IMPRESSION: Persistent bilateral pneumonia greatest on the left. There has been slight interval improvement in the appearance of the left lung over the past 2 days. Thoracic aortic atherosclerosis. Electronically Signed   By: David  SwazilandJordan M.D.   On: 01/22/2017 10:02    ASSESSMENT / PLAN:  Acute hypoxic respiratory failure in setting of BASDZ, +CAP, +Flu A s/p tamiflu x 5 days Plan  -Continue Supplemental oxygen to maintain SpO2 >88 -Pulmonary Hygiene > IS, mobilize as tolerated  -Trend CXR  -Will most likely require supplemental oxygen at home  -Needs ambulatory saturation study before d/c   Acute Diastolic CHF > G1DD Pulmonary Edema  Plan -Continue Lasix BID  -ECHO pending  -Trend BMP -Replace electrolytes as needed   Jovita KussmaulKatalina Eubanks, AGAC-NP [facility]  Pgr: [phone]  PCCM Pgr: [phone]  Attending Note:  56 year old female with acute respiratory failure and hypoxemia due to flu A and CAP.  Patient was extubated and transferred out of the ICU but remains with very high O2 need.  On exam, diffuse crackles.  I reviewed CXR myself, pulmonary infiltrate noted.  Discussed with PCCM-NP.  Hypoxemia:  - Titrate O2 for sat of 88-92%.  - Unfortunately up to 8L [place] today, continue titration efforts.  - Would work on qualifying her for home O2 now.  CAP:  - Abx down to cefepime  - Appreciate input form ID.  Flu A - Tamiflu completed and d/ced  Pulmonary hygienes:  - IS per RT protocol  - Work on ambulation  Pulmonary edema:  - Continue lasix 40 BID  - BMET in AM  - Replace electrolytes as indicated.  PCCM will follow  Patient seen and examined, agree  with above note. I dictated the care and orders written for this patient under my direction.  Alyson Reedy, MD [phone]

## 2017-01-22 NOTE — Progress Notes (Signed)
Interpretor used to communicate with pt. Earlier pt c/o back pain that improves with repositioning. Tylenol given, pt denied pain on reassessment. Through out the night pt O2 saturation in 93% to 97% range on 9 L high flow nasal cannula. No signs of respiratory distress noted. Lung sounds clear in upper lobes and diminished in lower lobes. Will continue to monitor pt. Church member at the bedside.

## 2017-01-22 NOTE — Progress Notes (Signed)
PROGRESS NOTE   Denise Mosley  ZDG:[id number]    DOB: November 17, 1960    DOA: 01/14/2017  PCP: Patient, No Pcp Per   I have briefly reviewed patients previous medical records in [facility].  Brief Narrative:  [name], lives with adult son, does not have a PCP, IADL, PMH of type II DM, HTN, former smoker, chronic back pain, presented to ED with 3 days history of productive cough, dyspnea, weakness, poor appetite and weakness. In ED, fever of 103.1, tachycardic 113/m, transient hypoxia 87-88 percent, normal lactate, sodium 127 and chest x-ray suggestive of lobar pneumonia. Admitted for community-acquired pneumonia.Flu confirmed. Despite appropriate antibiotics (ceftriaxone, azithromycin > levofloxacin), worsening clinically and radiologically. ID and CCM consulted.Slowly improving.  Assessment & Plan:   Principal Problem:   CAP (community acquired pneumonia) Active Problems:   Hyponatremia   Dehydration   Diabetes mellitus type 2, controlled ([place])   Acute respiratory failure with hypoxia (HCC) in setting of BASDZ, CAP   Influenza due to identified novel influenza A virus with other respiratory manifestations   Acute pulmonary edema (HCC)   Atelectasis   Pneumonia of both lungs due to infectious organism   1. Community-acquired pneumonia complicating influenza A: HIV antibody, urine Legionella and pneumococcal antigen: Negative, flu panel PCR: Positive for influenza A, Blood cultures 2: Negative. Despite appropriate antimicrobial regimen (Tamiflu, IV Rocephin/azithromycin >levofloxacin), continued to worsen clinically and radiologically.So broadened antibiotic coverage to vancomycin, cefepime and azithromycin, ID consult appreciated, vancomycin has been stopped yesterday, to continue with cefepime for total of 10 days(or can be transitioned to by mouth Levaquin if she is discharged) , sputum culture growing Streptococcus Mitis and Candida ,most likely related to  contamination, 2. Influenza A: Currently afebrile, treated with Tamiflu 3. Sepsis secondary to pneumonia & influenza: Met sepsis criteria on admission. Sepsis physiology have improved.  4. Acute diastolic CHF: This is most likely contributing to respiratory failure, 2-D echo with grade 1 diastolic dysfunction and preserved EF, continue with Lasix 5. Acute respiratory failure with hypoxia: Progressive hypoxia despite treatment for pneumonia and flu. Suspect due to multilobar pneumonia, influenza and? Pulmonary edema. Ambulate and monitor for home oxygen needs, likely will discharge home on home oxygen.   6. Dehydration with hyponatremia: Resolved. 7. Uncontrolled type II DM: A1c 12.3 suggesting poor outpatient control. Home metformin on hold. Needs close outpatient follow-up with a new PCP. Discussed with patient and she verbalizes understanding. Continue Lantus 15 units QHS and continue SSI. Better. Patient willing to do insulins at home. As discussed with case management, they will be able to assist with insulin at discharge > consider Lantus/SSI (if CM can assist) Vs 70/30 insulin at DC. Will consult nutrition service for by 2-D location 8. Anemia and thrombocytopenia: CBC has normalized on 4/7. 9. Essential hypertension: Controlled. Currently not on antihypertensives. 10. Chronic back pain:, worsened somewhat due to coughing spells. Treat supportively. Due to persistent complain of back pain, obtained T-spine x-ray which showed no acute findings. No acute findings on local exam. Improved. 11. Asymptomatic bacteriuria/Escherichia coli: Sensitive anyway to ceftriaxone & levofloxacin Hypokalemia: Repleted, recheck in a.m.  DVT prophylaxis: Lovenox Code Status: Full Family Communication: Discussed in detail with patient's sister at bedside.  Disposition: DC home when medically stable, hopefully in 1-2 days  Consultants:  Infectious disease Pulmonology  Procedures:  None  Antimicrobials:  IV  ceftriaxone and azithromycin -discontinued Tamiflu Oral levofloxacin 5/4 > discontinued IV cefepime > IV vancomycin >discontinued  Subjective: Patient reports some improvement but still  requiring high flow oxygen  Objective:  Vitals:   01/22/17 0500 01/22/17 0511 01/22/17 0756 01/22/17 1032  BP:  91/60    Pulse:  71    Resp:  18    Temp:  98.5 F (36.9 C)    TempSrc:  Oral    SpO2:  92% 93% 95%  Weight: 77.4 kg (170 lb 10.2 oz)     Height:       Examination:  General exam: [name] sitting up comfortably in bed this morning. Does not appear in any respiratory distress. Respiratory system: Good air entry bilaterally, scattered crackles, no wheezing, no use of accessory muscle   Cardiovascular system: S1 & S2 heard, RRR. No JVD, no rubs murmurs gallops, no pedal edema  Gastrointestinal system: Abdomen is nondistended, soft and nontender. No organomegaly or masses felt. Normal bowel sounds heard. Central nervous system: Alert and oriented. No focal neurological deficits. Extremities: Symmetric 5 x 5 power. Skin: No rashes, lesions or ulcers Psychiatry: Judgement and insight appear normal. Mood & affect appropriate.   Data Reviewed: I have personally reviewed following labs and imaging studies  CBC:  Recent Labs Lab 01/16/17 0305 01/17/17 0438 01/18/17 0522 01/20/17 0702 01/22/17 0804  WBC 5.2 3.5* 5.7 5.5 6.7  HGB 12.3 12.3 11.8* 12.7 14.3  HCT 37.0 37.5 35.7* 38.3 42.4  MCV 85.6 85.8 85.0 85.7 85.5  PLT 140* 143* 174 243 416   Basic Metabolic Panel:  Recent Labs Lab 01/19/17 0441 01/19/17 1349 01/20/17 0702 01/21/17 0619 01/22/17 0804  NA 139 137 139 139 136  K 2.3* 3.0* 3.0* 3.2* 3.6  CL 102 101 104 101 99*  CO2 _0 GLUCOSE 141* 219* 155* 160* 172*  BUN <5* <5* 5* 10 10  CREATININE 0.55 0.55 0.44 0.48 0.49  CALCIUM 8.4* 8.5* 8.6* 9.1 9.2  MG 1.9  --  1.9  --   --   PHOS  --   --  3.5  --   --    Liver Function  Tests: No results for input(s): AST, ALT, ALKPHOS, BILITOT, PROT, ALBUMIN in the last 168 hours. Coagulation Profile: No results for input(s): INR, PROTIME in the last 168 hours. CBG:  Recent Labs Lab 01/21/17 1200 01/21/17 1634 01/21/17 2122 01/22/17 0752 01/22/17 1159  GLUCAP 200* 105* 163* 179* 172*    Recent Results (from the past 240 hour(s))  Blood culture (routine x 2)     Status: None   Collection Time: 01/14/17  8:27 PM  Result Value Ref Range Status   Specimen Description BLOOD RIGHT ARM  Final   Special Requests   Final    BOTTLES DRAWN AEROBIC AND ANAEROBIC Blood Culture adequate volume   Culture NO GROWTH 5 DAYS  Final   Report Status 01/19/2017 FINAL  Final  Blood culture (routine x 2)     Status: None   Collection Time: 01/14/17  8:35 PM  Result Value Ref Range Status   Specimen Description BLOOD LEFT ARM  Final   Special Requests IN PEDIATRIC BOTTLE Blood Culture adequate volume  Final   Culture NO GROWTH 5 DAYS  Final   Report Status 01/19/2017 FINAL  Final  Urine culture     Status: Abnormal   Collection Time: 01/14/17 10:50 PM  Result Value Ref Range Status   Specimen Description URINE, CLEAN CATCH  Final   Special Requests NONE  Final   Culture >=100,000 COLONIES/mL ESCHERICHIA COLI (A)  Final   Report  Status 01/17/2017 FINAL  Final   Organism ID, Bacteria ESCHERICHIA COLI (A)  Final      Susceptibility   Escherichia coli - MIC*    AMPICILLIN >=32 RESISTANT Resistant     CEFAZOLIN <=4 SENSITIVE Sensitive     CEFTRIAXONE <=1 SENSITIVE Sensitive     CIPROFLOXACIN <=0.25 SENSITIVE Sensitive     GENTAMICIN >=16 RESISTANT Resistant     IMIPENEM 1 SENSITIVE Sensitive     NITROFURANTOIN <=16 SENSITIVE Sensitive     TRIMETH/SULFA <=20 SENSITIVE Sensitive     AMPICILLIN/SULBACTAM 16 INTERMEDIATE Intermediate     PIP/TAZO <=4 SENSITIVE Sensitive     Extended ESBL NEGATIVE Sensitive     * >=100,000 COLONIES/mL ESCHERICHIA COLI  Culture, sputum-assessment      Status: None   Collection Time: 01/15/17  9:40 AM  Result Value Ref Range Status   Specimen Description EXPECTORATED SPUTUM  Final   Special Requests NONE  Final   Sputum evaluation   Final    Sputum specimen not acceptable for testing.  Please recollect.   Results Called to: Titus Dubin, RN AT [facility] 01/16/17. Colin Rhein, MLT    Report Status 01/16/2017 FINAL  Final  Culture, expectorated sputum-assessment     Status: None   Collection Time: 01/16/17  5:43 PM  Result Value Ref Range Status   Specimen Description EXPECTORATED SPUTUM  Final   Special Requests NONE  Final   Sputum evaluation THIS SPECIMEN IS ACCEPTABLE FOR SPUTUM CULTURE  Final   Report Status 01/16/2017 FINAL  Final  Culture, respiratory (NON-Expectorated)     Status: None   Collection Time: 01/16/17  5:43 PM  Result Value Ref Range Status   Specimen Description EXPECTORATED SPUTUM  Final   Special Requests NONE Reflexed from [id number]  Final   Gram Stain   Final    ABUNDANT WBC PRESENT,BOTH PMN AND MONONUCLEAR RARE GRAM POSITIVE COCCI IN PAIRS RARE GRAM NEGATIVE RODS    Culture   Final    MODERATE STREPTOCOCCUS MITIS/ORALIS RARE CANDIDA ALBICANS PURE CULTURE OF THESE TWO ORGANISMS    Report Status 01/20/2017 FINAL  Final   Radiology Studies: Dg Chest Port 1 View  Result Date: 01/22/2017 CLINICAL DATA:  Shortness of breath and cough for the past 2 days. Former smoker. History of previous pneumonia. EXAM: PORTABLE CHEST 1 VIEW COMPARISON:  PA and lateral chest x-ray of Jan 20, 2017 and June 27, 2014. FINDINGS: There persistent patchy alveolar opacities bilaterally greatest on the left. There is no pleural effusion. The heart and pulmonary vascularity are normal. There is calcification in the wall of the aortic arch. The observed bony thorax is unremarkable. IMPRESSION: Persistent bilateral pneumonia greatest on the left. There has been slight interval improvement in the appearance of the left lung over the past 2  days. Thoracic aortic atherosclerosis. Electronically Signed   By: David  Martinique M.D.   On: 01/22/2017 10:02   Scheduled Meds: . benzonatate  200 mg Oral TID  . budesonide (PULMICORT) nebulizer solution  0.5 mg Nebulization BID  . enoxaparin (LOVENOX) injection  40 mg Subcutaneous Q24H  . furosemide  40 mg Intravenous BID  . insulin aspart  0-15 Units Subcutaneous TID WC  . insulin aspart  0-5 Units Subcutaneous QHS  . insulin glargine  15 Units Subcutaneous QHS  . potassium chloride  40 mEq Oral BID   Continuous Infusions: . ceFEPime (MAXIPIME) IV 1 g (01/22/17 0558)    LOS: 8 days    Irwin Brakeman, MD,  [facility] Pager [phone]  If 7PM-7AM, please contact night-coverage [email or web address] Password [email or web address] 01/22/2017, 12:02 PM

## 2017-01-23 DIAGNOSIS — J111 Influenza due to unidentified influenza virus with other respiratory manifestations: Secondary | ICD-10-CM

## 2017-01-23 DIAGNOSIS — E871 Hypo-osmolality and hyponatremia: Secondary | ICD-10-CM

## 2017-01-23 DIAGNOSIS — E1121 Type 2 diabetes mellitus with diabetic nephropathy: Secondary | ICD-10-CM

## 2017-01-23 DIAGNOSIS — J09X2 Influenza due to identified novel influenza A virus with other respiratory manifestations: Secondary | ICD-10-CM

## 2017-01-23 LAB — BASIC METABOLIC PANEL
ANION GAP: 11 (ref 5–15)
BUN: 13 mg/dL (ref 6–20)
CALCIUM: 9.4 mg/dL (ref 8.9–10.3)
CO2: 23 mmol/L (ref 22–32)
CREATININE: 0.46 mg/dL (ref 0.44–1.00)
Chloride: 100 mmol/L — ABNORMAL LOW (ref 101–111)
GFR calc non Af Amer: >60 mL/min (ref >60)
GLUCOSE: 154 mg/dL — AB (ref 65–99)
POTASSIUM: 3.9 mmol/L (ref 3.5–5.1)
SODIUM: 134 mmol/L — AB (ref 135–145)

## 2017-01-23 LAB — CBC
HCT: 41.6 % (ref 36.0–46.0)
HEMOGLOBIN: 14 g/dL (ref 12.0–15.0)
MCH: 28.8 pg (ref 26.0–34.0)
MCHC: 33.7 g/dL (ref 30.0–36.0)
MCV: 85.6 fL (ref 78.0–100.0)
PLATELETS: 372 10*3/uL (ref 150–400)
RBC: 4.86 MIL/uL (ref 3.87–5.11)
RDW: 12.7 % (ref 11.5–15.5)
WBC: 7.2 10*3/uL (ref 4.0–10.5)

## 2017-01-23 LAB — GLUCOSE, CAPILLARY
GLUCOSE-CAPILLARY: 157 mg/dL — AB (ref 65–99)
Glucose-Capillary: 167 mg/dL — ABNORMAL HIGH (ref 65–99)

## 2017-01-23 MED ORDER — BLOOD GLUCOSE METER KIT: PACK | 0 refills | Status: DC

## 2017-01-23 MED ORDER — METFORMIN HCL 500 MG PO TABS: 1000.0000 mg | ORAL_TABLET | Freq: Two times a day (BID) | ORAL | 0 refills | Status: DC

## 2017-01-23 MED ORDER — INSULIN GLARGINE 100 UNIT/ML ~~LOC~~ SOLN: 15.0000 [IU] | Freq: Every day | SUBCUTANEOUS | 0 refills | Status: DC

## 2017-01-23 MED ORDER — LEVOFLOXACIN 750 MG PO TABS: 750.0000 mg | ORAL_TABLET | Freq: Every day | ORAL | 0 refills | Status: AC

## 2017-01-23 MED ORDER — "INSULIN SYRINGE 31G X 5/16"" 0.3 ML MISC": 1.0000 [IU] | 0 refills | Status: DC

## 2017-01-23 NOTE — Progress Notes (Signed)
Name: Denise Mosley MRN: [id number] DOB: 09-Oct-1960    ADMISSION DATE:  01/14/2017 CONSULTATION DATE:  01/19/2017  REFERRING MD :  Dr. Laural BenesJohnson   CHIEF COMPLAINT:  Dyspnea, Acute Hypoxic Respiratory Failure   BRIEF PATIENT DESCRIPTION:  56 yo non-english speaking spanish female with PMH of DM, HTN, former smoker, and chronic back pain.  Admitted 5/2 with BASDZ , CAP + flu a and strep mitis/oralis. Treated with vancomycin/cefepime/tamiflu. Continued hypoxia and increase oxygen requirements  SIGNIFICANT EVENTS  5/1 > Admission  5/6 > PCCM consulted   STUDIES:  CXR 5/7 > stable L > R airsace disease ECHO 5/8 > mild concentric hypertrophy, Systolic function was normal. [id number] CXR 5/9 > Persistent bilateral PNA greatest on the left, slight interval improvement in appearance of the left lung over the past 2 days   SUBJECTIVE:  Weaned to 2L [place]. Hypotensive this AM. No distress. States she feels like she is slowly improving.   VITAL SIGNS: Temp:  [97.7 F (36.5 C)-98.3 F (36.8 C)] 98.3 F (36.8 C) (05/10 0550) Pulse Rate:  [71-90] 71 (05/10 0550) Resp:  [17-20] 17 (05/10 0550) BP: (88-96)/(45-64) 88/45 (05/10 0550) SpO2:  [93 %-100 %] 97 % (05/10 0550)  PHYSICAL EXAMINATION: General:  Adult female, no distress  Neuro:  Alert, oriented, follows commands, moves all extremities  HEENT:  Normocephalic  Cardiovascular:  RRR, no MRG, NI S1/S2 Lungs:  Non-labored, no crackles, no wheeze  Abdomen:  Non-tender, non-distended, active bowel sounds  Musculoskeletal:  No acute Skin:  Warm, dry, intact    Recent Labs Lab 01/21/17 0619 01/22/17 0804 01/23/17 0525  NA 139 136 134*  K 3.2* 3.6 3.9  CL 101 99* 100*  CO2 26 26 23   BUN 10 10 13   CREATININE 0.48 0.49 0.46  GLUCOSE 160* 172* 154*    Recent Labs Lab 01/20/17 0702 01/22/17 0804 01/23/17 0525  HGB 12.7 14.3 14.0  HCT 38.3 42.4 41.6  WBC 5.5 6.7 7.2  PLT 243 345 372   Dg Chest Port 1 View  Result Date:  01/22/2017 CLINICAL DATA:  Shortness of breath and cough for the past 2 days. Former smoker. History of previous pneumonia. EXAM: PORTABLE CHEST 1 VIEW COMPARISON:  PA and lateral chest x-ray of Jan 20, 2017 and June 27, 2014. FINDINGS: There persistent patchy alveolar opacities bilaterally greatest on the left. There is no pleural effusion. The heart and pulmonary vascularity are normal. There is calcification in the wall of the aortic arch. The observed bony thorax is unremarkable. IMPRESSION: Persistent bilateral pneumonia greatest on the left. There has been slight interval improvement in the appearance of the left lung over the past 2 days. Thoracic aortic atherosclerosis. Electronically Signed   By: David  [place] M.D.   On: 01/22/2017 10:02    ASSESSMENT / PLAN:  Acute Hypoxic Respiratory Failure in setting of BASDZ, +CAP, +Flu A s/p tamiflu x 5 days Plan  -Supplemental Oxygen to Maintain Sp02 >88 -Pulmonary Hygiene >IS, mobilize as tolerated  -Trend CXR -Will most likely require supplemental oxygen at home > will need ambulatory saturation study before d/c  Acute on Chronic Diastolic CHF (G1DD) Plan -D/C Lasix in setting of hypotension > weight down from 82.6 kg to 77.4 kg  -Trend BMP -Replace electrolytes as needed   CAP -Continue Cefepime  -ID following   Remaining medical management per primary team.   Jovita KussmaulKatalina Eubanks, AGAC-NP [facility]  Pgr: [phone]  PCCM Pgr: [phone] [id number]  Attending Note:  56  year old female with acute respiratory failure with hypoxemia due to flu A and CAP.  Patient was extubated and transferred out of the ICU but remains with very high O2 need.  On exam, crackles are improving.  I reviewed CXR myself, infiltrate improving.  Discussed with PCCM-NP.  Hypoxemia: - Titrate O2 for sat of 88-92%. - Qualify patient for home O2, can not imagine a scenario where she will be O2 free prior to going  home. - Down to 4L [place]  CAP: - Continue abx for now, cefepime only  - Appreciate input from ID service - Cultures negative to date  Flu A - D/C tamiflu, course complete  Pulmonary hygienes: - IS per RT protocol  Pulmonary edema: - D/C lasix - BMET in AM - Replace electrolytes as indicated  PCCM will sign off, please call back if needed.  Patient seen and examined, agree with above note. I dictated the care and orders written for this patient under my direction.  Alyson Reedy, MD [phone]

## 2017-01-23 NOTE — Discharge Summary (Signed)
Physician Discharge Summary  Denise Mosley WER:[id number] DOB: Oct 03, 1960 DOA: 01/14/2017  PCP: [place] date: 01/14/2017 Discharge date: 01/23/2017  Admitted From: Home  Disposition:  Home   Recommendations for Outpatient Follow-up:  1. Follow up with PCP in 1 weeks 2. Please obtain BMP/CBC in one week  Equipment/Devices: home oxygen  Discharge Condition: STABLE  CODE STATUS: FULL  Diet recommendation: Heart Healthy / Carb Modified   Brief/Interim Summary: PLEASE SEE HOSPITAL NOTES, RECORDS, LABS, IMAGES FOR FULL DETAILS OF THIS LONG HOSPITAL STAY  HPI: Denise Mosley is a 56 y.o. female with history of diabetes mellitus type 2 on metformin has been having productive cough shortness of breath with back pain and weakness over the last 3 days. Patient's symptoms have been progressively worsening. Patient has been having poor appetite and felt weak. Patient felt weak and was unable to walk but did not loose function of the extremities.   ED Course: In the ER patient was found to be febrile and tachycardic with leukocytosis. Chest x-ray shows consolidation consistent with pneumonia. On exam patient appears nonfocal. But generally weak. Labs reveal normal anion gap with low bicarbonate. Patient was given fluid bolus for dehydration and started on antibiotics for community-acquired pneumonia after blood cultures were obtained.  Brief Narrative:  [name], lives with adult son, does not have a PCP, IADL, PMH of type II DM, HTN, former smoker, chronic back pain, presented to ED with 3 days history of productive cough, dyspnea, weakness, poor appetite and weakness. In ED, fever of 103.1, tachycardic 113/m, transient hypoxia 87-88 percent, normal lactate, sodium 127 and chest x-ray suggestive of lobar pneumonia. Admitted for community-acquired pneumonia.Flu confirmed. Despite appropriate antibiotics (ceftriaxone, azithromycin >  levofloxacin), worsening clinically and radiologically. ID and CCM consulted.Slowly improving.  Assessment & Plan:   Principal Problem:   CAP (community acquired pneumonia) Active Problems:   Hyponatremia   Dehydration   Diabetes mellitus type 2, controlled ([place])   Acute respiratory failure with hypoxia (HCC) in setting of BASDZ, CAP   Influenza due to identified novel influenza A virus with other respiratory manifestations   Acute pulmonary edema (HCC)   Atelectasis   Pneumonia of both lungs due to infectious organism   1. Community-acquired pneumonia complicating influenza A: HIV antibody, urine Legionella and pneumococcal antigen: Negative, flu panel PCR: Positive for influenza A, Blood cultures 2: Negative. Despite appropriate antimicrobial regimen (Tamiflu, IV Rocephin/azithromycin >levofloxacin), continued to worsen clinically and radiologically.So broadened antibiotic coverage to vancomycin, cefepime and azithromycin, ID consult appreciated, vancomycin has been stopped yesterday, to continue LEVOFLOXACIN THRU 01/27/17 THEN STOP.  sputum culture growing Streptococcus Mitis and Candida ,most likely related to contamination.  2. Influenza A: Currently afebrile, treated with Tamiflu TO COMPLETION.  3. Sepsis secondary to pneumonia & influenza: Met sepsis criteria on admission. Sepsis physiology have improved.  4. Acute diastolic CHF: This is most likely contributing to respiratory failure, 2-D echo with grade 1 diastolic dysfunction and preserved EF, treated with Lasix and then lasix discontinued.  5. Acute respiratory failure with hypoxia: Progressive hypoxia despite treatment for pneumonia and flu. Suspect due to multilobar pneumonia, influenza. Now improving.  Ambulate and monitor for home oxygen needs, will discharge home on home oxygen.   6. Dehydration with hyponatremia: Resolved. 7. Uncontrolled type II DM: A1c 12.3 suggesting poor outpatient control. Home metformin on hold. Needs  close outpatient follow-up with a new PCP. Discussed with patient and she verbalizes understanding. Continue Lantus 15 units QHS  and metformin. Better. Patient willing to do insulins at home. As discussed with case management, they will be able to assist with insulin at discharge > consider Lantus/SSI (if CM can assist).  Discharge on lantus plus metformin, plus blood glucose meter and testing supplies.  Check blood glucose 4-5 times daily at home.  8. Anemia and thrombocytopenia: CBC has normalized.  9. Essential hypertension: Controlled. Currently not on antihypertensives. 10. Chronic back pain:, worsened somewhat due to coughing spells. Treat supportively. Due to persistent complain of back pain, obtained T-spine x-ray which showed no acute findings. No acute findings on local exam. Improved. 11. Asymptomatic bacteriuria/Escherichia coli: Sensitive anyway to ceftriaxone & levofloxacin  12. Hypokalemia: Repleted.    DVT prophylaxis: Lovenox Code Status: Full Family Communication: Discussed in detail with patient's sister at bedside.  Disposition: DC home  Consultants:  Infectious disease Pulmonology  Procedures:  None  Antimicrobials:  IV ceftriaxone and azithromycin -discontinued Tamiflu Oral levofloxacin 5/4 > discontinued IV cefepime >5/10 IV vancomycin >discontinued  Discharge Diagnoses:  Principal Problem:   CAP (community acquired pneumonia) Active Problems:   Hyponatremia   Dehydration   Diabetes mellitus type 2, controlled ([place])   Acute respiratory failure with hypoxia (HCC) in setting of BASDZ, CAP   Influenza due to identified novel influenza A virus with other respiratory manifestations   Acute pulmonary edema (HCC)   Atelectasis   Pneumonia of both lungs due to infectious organism   Hyperglycemia   Uncontrolled type 2 diabetes mellitus with complication [facility])  Discharge Instructions  Discharge Instructions    Increase activity slowly    Complete by:  As  directed      Allergies as of 01/23/2017   No Known Allergies     Medication List    STOP taking these medications   cephALEXin 500 MG capsule Commonly known as:  KEFLEX   HYDROcodone-acetaminophen 5-325 MG tablet Commonly known as:  NORCO/VICODIN   ibuprofen 600 MG tablet Commonly known as:  ADVIL,MOTRIN   predniSONE 20 MG tablet Commonly known as:  DELTASONE   valACYclovir 1000 MG tablet Commonly known as:  VALTREX     TAKE these medications   acetaminophen 500 MG tablet Commonly known as:  TYLENOL Take 1,000 mg by mouth every 6 (six) hours as needed for mild pain.   blood glucose meter kit and supplies Dispense based on patient and insurance preference. Use up to four times daily as directed. (E11.8).   insulin glargine 100 UNIT/ML injection Commonly known as:  LANTUS Inject 0.15 mLs (15 Units total) into the skin at bedtime.   INSULIN SYRINGE .3CC/31GX5/16" 31G X 5/16" 0.3 ML Misc 1 Units by Does not apply route as directed.   levofloxacin 750 MG tablet Commonly known as:  LEVAQUIN Take 1 tablet (750 mg total) by mouth daily.   metFORMIN 500 MG tablet Commonly known as:  GLUCOPHAGE Take 2 tablets (1,000 mg total) by mouth 2 (two) times daily with a meal. What changed:  how much to take      [place]. Schedule an appointment as soon as possible for a visit in 5 day(s).   Why:  Hospital Follow Up Contact information: [address] [place] [address] [phone]         No Known Allergies  Procedures/Studies: Dg Chest 2 View  Result Date: 01/20/2017 CLINICAL DATA:  Hypoxia EXAM: CHEST  2 VIEW COMPARISON:  01/19/2017 FINDINGS: Airspace disease continues throughout the  left lung. Patchy opacities in the right lung. No real change since prior study. Heart is borderline in size. No effusions. IMPRESSION: Stable bilateral airspace disease, left greater than right.  Electronically Signed   By: Rolm Baptise M.D.   On: 01/20/2017 08:15   Dg Chest 2 View  Result Date: 01/19/2017 CLINICAL DATA:  Fever cough and back pain for 1 week.  Hypoxia. EXAM: CHEST  2 VIEW COMPARISON:  01/18/2017 FINDINGS: Bilateral airspace opacities, left-greater-than-right, are unchanged. Cardiomediastinal silhouette is unchanged. No pleural effusion pneumothorax. This is a low volume film. IMPRESSION: Unchanged chest radiograph with bilateral airspace opacities, left-greater-than-right. Electronically Signed   By: Margarette Canada M.D.   On: 01/19/2017 12:26   Dg Chest 2 View  Result Date: 01/18/2017 CLINICAL DATA:  Followup pneumonia. EXAM: CHEST  2 VIEW COMPARISON:  01/16/2017 FINDINGS: Mild cardiac enlargement. Multifocal airspace densities are identified throughout the left lung. Less severe multifocal patchy opacities are noted within the right upper lobe and right base. When compared with the previous exam aeration of both lungs appears diminished. IMPRESSION: 1. Persistent and progressive bilateral pneumonia. Electronically Signed   By: Kerby Moors M.D.   On: 01/18/2017 13:26   Dg Chest 2 View  Result Date: 01/16/2017 CLINICAL DATA:  56 year old female with fever, pneumonia/ flu. EXAM: CHEST  2 VIEW COMPARISON:  01/14/2017 and earlier. FINDINGS: Confluent peribronchial opacity in the left lung with a lower lobe predominance, but opacity tracks peripherally probably into all 3 left side lobes. No pleural effusion. Early peribronchial opacity suspected about the hilum on the right. Right lung otherwise clear. No pneumothorax. Mediastinal contours are stable and within normal limits. Visualized tracheal air column is within normal limits. No acute osseous abnormality identified. Negative visible bowel gas pattern. IMPRESSION: Multilobar pneumonia/bronchopneumonia on the left, and probable early involvement also in the right lung. No pleural effusion. Electronically Signed   By: Genevie Ann M.D.    On: 01/16/2017 12:07   Dg Chest 2 View  Result Date: 01/14/2017 CLINICAL DATA:  Fever, lethargy and back pain tonight. EXAM: CHEST  2 VIEW COMPARISON:  06/27/2014 FINDINGS: There is confluent consolidation of the left lung base. Patchy opacity in the central right lung may represent an additional area of airspace consolidation. No pleural effusion. Normal pulmonary vasculature. Hilar, mediastinal and cardiac contours are unremarkable and unchanged. IMPRESSION: Consolidation in the left base and in the central right lung. This may represent pneumonia. Followup PA and lateral chest X-ray is recommended in 3-4 weeks following trial of antibiotic therapy to ensure resolution and exclude underlying malignancy. Electronically Signed   By: Andreas Newport M.D.   On: 01/14/2017 21:38   Dg Thoracic Spine W/swimmers  Result Date: 01/18/2017 CLINICAL DATA:  Back pain. EXAM: THORACIC SPINE - 3 VIEWS COMPARISON:  Radiographs of Jan 16, 2017. FINDINGS: No fracture or spondylolisthesis is noted. 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 IMPRESSION: Mild degenerative changes are noted in lower thoracic spine. No acute abnormality is noted. Electronically Signed   By: Marijo Conception, M.D.   On: 01/18/2017 13:27   Dg Chest Port 1 View  Result Date: 01/22/2017 CLINICAL DATA:  Shortness of breath and cough for the past 2 days. Former smoker. History of previous pneumonia. EXAM: PORTABLE CHEST 1 VIEW COMPARISON:  PA and lateral chest x-ray of Jan 20, 2017 and June 27, 2014. FINDINGS: There persistent patchy alveolar opacities bilaterally greatest on the left. There is no  pleural effusion. The heart and pulmonary vascularity are normal. There is calcification in the wall of the aortic arch. The observed bony thorax is unremarkable. IMPRESSION: Persistent bilateral pneumonia greatest on the left. There has been slight interval improvement in the appearance  of the left lung over the past 2 days. Thoracic aortic atherosclerosis. Electronically Signed   By: David  Martinique M.D.   On: 01/22/2017 10:02    (Echo, Carotid, EGD, Colonoscopy, ERCP)    Subjective: Pt without complaints today  Discharge Exam: Vitals:   01/22/17 2137 01/23/17 0550  BP: (!) 96/58 (!) 88/45  Pulse: 77 71  Resp: 20 17  Temp: 98.2 F (36.8 C) 98.3 F (36.8 C)   Vitals:   01/22/17 1431 01/22/17 1500 01/22/17 2137 01/23/17 0550  BP: 93/64  (!) 96/58 (!) 88/45  Pulse: 90  77 71  Resp: _0 Temp: 97.7 F (36.5 C)  98.2 F (36.8 C) 98.3 F (36.8 C)  TempSrc: Oral  Oral Oral  SpO2: 95% 100% 100% 97%  Weight:      Height:       General exam: [name] sitting up comfortably in bed this morning. Does not appear in any respiratory distress. Respiratory system: Good air entry bilaterally, no wheezing, no use of accessory muscle   Cardiovascular system: S1 & S2 heard, RRR. No JVD, no rubs murmurs gallops, no pedal edema  Gastrointestinal system: Abdomen is nondistended, soft and nontender. No organomegaly or masses felt. Normal bowel sounds heard. Central nervous system: Alert and oriented. No focal neurological deficits. Extremities: Symmetric 5 x 5 power. Skin: No rashes, lesions or ulcers Psychiatry: Judgement and insight appear normal. Mood & affect appropriate.    The results of significant diagnostics from this hospitalization (including imaging, microbiology, ancillary and laboratory) are listed below for reference.     Microbiology: Recent Results (from the past 240 hour(s))  Blood culture (routine x 2)     Status: None   Collection Time: 01/14/17  8:27 PM  Result Value Ref Range Status   Specimen Description BLOOD RIGHT ARM  Final   Special Requests   Final    BOTTLES DRAWN AEROBIC AND ANAEROBIC Blood Culture adequate volume   Culture NO GROWTH 5 DAYS  Final   Report Status 01/19/2017 FINAL  Final  Blood culture (routine x  2)     Status: None   Collection Time: 01/14/17  8:35 PM  Result Value Ref Range Status   Specimen Description BLOOD LEFT ARM  Final   Special Requests IN PEDIATRIC BOTTLE Blood Culture adequate volume  Final   Culture NO GROWTH 5 DAYS  Final   Report Status 01/19/2017 FINAL  Final  Urine culture     Status: Abnormal   Collection Time: 01/14/17 10:50 PM  Result Value Ref Range Status   Specimen Description URINE, CLEAN CATCH  Final   Special Requests NONE  Final   Culture >=100,000 COLONIES/mL ESCHERICHIA COLI (A)  Final   Report Status 01/17/2017 FINAL  Final   Organism ID, Bacteria ESCHERICHIA COLI (A)  Final      Susceptibility   Escherichia coli - MIC*    AMPICILLIN >=32 RESISTANT Resistant     CEFAZOLIN <=4 SENSITIVE Sensitive     CEFTRIAXONE <=1 SENSITIVE Sensitive     CIPROFLOXACIN <=0.25 SENSITIVE Sensitive     GENTAMICIN >=16 RESISTANT Resistant     IMIPENEM 1 SENSITIVE Sensitive     NITROFURANTOIN <=16 SENSITIVE Sensitive  TRIMETH/SULFA <=20 SENSITIVE Sensitive     AMPICILLIN/SULBACTAM 16 INTERMEDIATE Intermediate     PIP/TAZO <=4 SENSITIVE Sensitive     Extended ESBL NEGATIVE Sensitive     * >=100,000 COLONIES/mL ESCHERICHIA COLI  Culture, sputum-assessment     Status: None   Collection Time: 01/15/17  9:40 AM  Result Value Ref Range Status   Specimen Description EXPECTORATED SPUTUM  Final   Special Requests NONE  Final   Sputum evaluation   Final    Sputum specimen not acceptable for testing.  Please recollect.   Results Called to: Titus Dubin, RN AT [place] ON 01/16/17. Colin Rhein, MLT    Report Status 01/16/2017 FINAL  Final  Culture, expectorated sputum-assessment     Status: None   Collection Time: 01/16/17  5:43 PM  Result Value Ref Range Status   Specimen Description EXPECTORATED SPUTUM  Final   Special Requests NONE  Final   Sputum evaluation THIS SPECIMEN IS ACCEPTABLE FOR SPUTUM CULTURE  Final   Report Status 01/16/2017 FINAL  Final  Culture,  respiratory (NON-Expectorated)     Status: None   Collection Time: 01/16/17  5:43 PM  Result Value Ref Range Status   Specimen Description EXPECTORATED SPUTUM  Final   Special Requests NONE Reflexed from [id number]  Final   Gram Stain   Final    ABUNDANT WBC PRESENT,BOTH PMN AND MONONUCLEAR RARE GRAM POSITIVE COCCI IN PAIRS RARE GRAM NEGATIVE RODS    Culture   Final    MODERATE STREPTOCOCCUS MITIS/ORALIS RARE CANDIDA ALBICANS PURE CULTURE OF THESE TWO ORGANISMS    Report Status 01/20/2017 FINAL  Final     Labs: BNP (last 3 results)  Recent Labs  01/20/17 0705  BNP 57.0   Basic Metabolic Panel:  Recent Labs Lab 01/19/17 0441 01/19/17 1349 01/20/17 0702 01/21/17 0619 01/22/17 0804 01/23/17 0525  NA 139 137 139 139 136 134*  K 2.3* 3.0* 3.0* 3.2* 3.6 3.9  CL 102 101 104 101 99* 100*  CO2 _0 GLUCOSE 141* 219* 155* 160* 172* 154*  BUN <5* <5* 5* _1 CREATININE 0.55 0.55 0.44 0.48 0.49 0.46  CALCIUM 8.4* 8.5* 8.6* 9.1 9.2 9.4  MG 1.9  --  1.9  --   --   --   PHOS  --   --  3.5  --   --   --    Liver Function Tests: No results for input(s): AST, ALT, ALKPHOS, BILITOT, PROT, ALBUMIN in the last 168 hours. No results for input(s): LIPASE, AMYLASE in the last 168 hours. No results for input(s): AMMONIA in the last 168 hours. CBC:  Recent Labs Lab 01/17/17 0438 01/18/17 0522 01/20/17 0702 01/22/17 0804 01/23/17 0525  WBC 3.5* 5.7 5.5 6.7 7.2  HGB 12.3 11.8* 12.7 14.3 14.0  HCT 37.5 35.7* 38.3 42.4 41.6  MCV 85.8 85.0 85.7 85.5 85.6  PLT 143* 174 243 345 372   Cardiac Enzymes: No results for input(s): CKTOTAL, CKMB, CKMBINDEX, TROPONINI in the last 168 hours. BNP: Invalid input(s): POCBNP CBG:  Recent Labs Lab 01/22/17 0752 01/22/17 1159 01/22/17 1655 01/22/17 2134 01/23/17 0838  GLUCAP 179* 172* 192* 191* 157*   D-Dimer No results for input(s): DDIMER in the last 72 hours. Hgb A1c No results for input(s): HGBA1C in the last 72  hours. Lipid Profile No results for input(s): CHOL, HDL, LDLCALC, TRIG, CHOLHDL, LDLDIRECT in the last 72 hours. Thyroid function studies No results for input(s): TSH,  T4TOTAL, T3FREE, THYROIDAB in the last 72 hours.  Invalid input(s): FREET3 Anemia work up No results for input(s): VITAMINB12, FOLATE, FERRITIN, TIBC, IRON, RETICCTPCT in the last 72 hours. Urinalysis    Component Value Date/Time   COLORURINE AMBER (A) 01/14/2017 2033   APPEARANCEUR CLEAR 01/14/2017 2033   LABSPEC 1.028 01/14/2017 2033   PHURINE 5.0 01/14/2017 2033   GLUCOSEU >=500 (A) 01/14/2017 2033   HGBUR NEGATIVE 01/14/2017 2033   BILIRUBINUR NEGATIVE 01/14/2017 2033   KETONESUR 80 (A) 01/14/2017 2033   PROTEINUR 100 (A) 01/14/2017 2033   UROBILINOGEN 0.2 11/20/2014 1408   NITRITE POSITIVE (A) 01/14/2017 2033   LEUKOCYTESUR NEGATIVE 01/14/2017 2033   Sepsis Labs Invalid input(s): PROCALCITONIN,  WBC,  LACTICIDVEN Microbiology Recent Results (from the past 240 hour(s))  Blood culture (routine x 2)     Status: None   Collection Time: 01/14/17  8:27 PM  Result Value Ref Range Status   Specimen Description BLOOD RIGHT ARM  Final   Special Requests   Final    BOTTLES DRAWN AEROBIC AND ANAEROBIC Blood Culture adequate volume   Culture NO GROWTH 5 DAYS  Final   Report Status 01/19/2017 FINAL  Final  Blood culture (routine x 2)     Status: None   Collection Time: 01/14/17  8:35 PM  Result Value Ref Range Status   Specimen Description BLOOD LEFT ARM  Final   Special Requests IN PEDIATRIC BOTTLE Blood Culture adequate volume  Final   Culture NO GROWTH 5 DAYS  Final   Report Status 01/19/2017 FINAL  Final  Urine culture     Status: Abnormal   Collection Time: 01/14/17 10:50 PM  Result Value Ref Range Status   Specimen Description URINE, CLEAN CATCH  Final   Special Requests NONE  Final   Culture >=100,000 COLONIES/mL ESCHERICHIA COLI (A)  Final   Report Status 01/17/2017 FINAL  Final   Organism ID, Bacteria  ESCHERICHIA COLI (A)  Final      Susceptibility   Escherichia coli - MIC*    AMPICILLIN >=32 RESISTANT Resistant     CEFAZOLIN <=4 SENSITIVE Sensitive     CEFTRIAXONE <=1 SENSITIVE Sensitive     CIPROFLOXACIN <=0.25 SENSITIVE Sensitive     GENTAMICIN >=16 RESISTANT Resistant     IMIPENEM 1 SENSITIVE Sensitive     NITROFURANTOIN <=16 SENSITIVE Sensitive     TRIMETH/SULFA <=20 SENSITIVE Sensitive     AMPICILLIN/SULBACTAM 16 INTERMEDIATE Intermediate     PIP/TAZO <=4 SENSITIVE Sensitive     Extended ESBL NEGATIVE Sensitive     * >=100,000 COLONIES/mL ESCHERICHIA COLI  Culture, sputum-assessment     Status: None   Collection Time: 01/15/17  9:40 AM  Result Value Ref Range Status   Specimen Description EXPECTORATED SPUTUM  Final   Special Requests NONE  Final   Sputum evaluation   Final    Sputum specimen not acceptable for testing.  Please recollect.   Results Called to: Titus Dubin, RN AT [place] ON 01/16/17. Colin Rhein, MLT    Report Status 01/16/2017 FINAL  Final  Culture, expectorated sputum-assessment     Status: None   Collection Time: 01/16/17  5:43 PM  Result Value Ref Range Status   Specimen Description EXPECTORATED SPUTUM  Final   Special Requests NONE  Final   Sputum evaluation THIS SPECIMEN IS ACCEPTABLE FOR SPUTUM CULTURE  Final   Report Status 01/16/2017 FINAL  Final  Culture, respiratory (NON-Expectorated)     Status: None   Collection Time: 01/16/17  5:43 PM  Result Value Ref Range Status   Specimen Description EXPECTORATED SPUTUM  Final   Special Requests NONE Reflexed from [id number]  Final   Gram Stain   Final    ABUNDANT WBC PRESENT,BOTH PMN AND MONONUCLEAR RARE GRAM POSITIVE COCCI IN PAIRS RARE GRAM NEGATIVE RODS    Culture   Final    MODERATE STREPTOCOCCUS MITIS/ORALIS RARE CANDIDA ALBICANS PURE CULTURE OF THESE TWO ORGANISMS    Report Status 01/20/2017 FINAL  Final   Time coordinating discharge: 40 minutes  SIGNED:  Irwin Brakeman, MD  [facility] 01/23/2017, 12:06 PM Pager [phone]  If 7PM-7AM, please contact night-coverage [email or web address] Password [email or web address]

## 2017-01-23 NOTE — Progress Notes (Signed)
SATURATION QUALIFICATIONS: (This note is used to comply with regulatory documentation for home oxygen)  Patient Saturations on Room Air at Rest = 98%  Patient Saturations on Room Air while Ambulating = 95%   

## 2017-01-23 NOTE — Care Management Note (Signed)
Case Management Note  Patient Details  Name: Denise Mosley MRN: [id number] Date of Birth: [birth year]  Subjective/Objective:    Admitted with CAP,  [name], lives with adult son, does not have a PCP, IADL, PMH of type II DM, HTN, former smoker, chronic back pain.   Action/Plan: Plan is d/c to home. Post hospital follow up scheduled for 02/11/2017 @ 2:30 pm @ the [facility].  Expected Discharge Date:  01/23/17               Expected Discharge Plan:  Home/Self Care  In-House Referral:     Discharge planning Services  CM Consult  Post Acute Care Choice:    Choice offered to:     DME Arranged:    DME Agency:   (Pt didn't qualify for home oxygen per oxygen saturation quailifier)  HH Arranged:    HH Agency:     Status of Service:  Completed, signed off  If discussed at Long Length of Stay Meetings, dates discussed:    Additional Comments:  Epifanio LeschesCole, Destine Ambroise Hudson, RN 01/23/2017, 2:01 PM

## 2017-01-23 NOTE — Progress Notes (Signed)
Denise Mosley to be D/C'd to home per MD order.  Discussed with the patient and all questions fully answered.  VSS, Skin clean, dry and intact without evidence of skin break down, no evidence of skin tears noted. IV catheter discontinued intact. Site without signs and symptoms of complications. Dressing and pressure applied.  An After Visit Summary was printed and given to the patient. Patient received prescription.  D/c education completed with patient/family using spanish speaking interpreter including follow up instructions, medication list, d/c activities limitations if indicated, with other d/c instructions as indicated by MD - patient able to verbalize understanding, all questions fully answered.   Patient instructed to return to ED, call 911, or call MD for any changes in condition.   Patient escorted via WC, and D/C home via private auto.  [name] 01/23/2017 1:57 PM

## 2017-01-23 NOTE — Progress Notes (Signed)
Brief Nutrition Education Note  RD consulted for nutrition education regarding diabetes.   Lab Results  Component Value Date   HGBA1C 12.3 (H) 01/15/2017   Case discussed with RN; pt is Spanish-speaking and requiring interpreter. Per RN, plan for pt to d/c today.   RD attempted education session with [employer] video interpreter. Interpreter reported difficulty hearing conversation due to noise feedback in room. RD attempted to reposition monitor to improve noise quality without success. Due to complications, unable to complete education at this time. RD will attempt to follow-up later this afternoon or tomorrow morning if pt is still in the hospital.   Ehab Humber A. Mayford KnifeWilliams, RD, LDN, CDE Pager: [phone] After hours Pager: [phone]

## 2017-01-24 MED FILL — !LANTUS 100 UNITS/ML VIAL: 100 | 28 days supply | Qty: 10 | Fill #0

## 2017-01-24 MED FILL — ?METFORMIN HCL 500MG TABLET: 500 | 30 days supply | Qty: 120 | Fill #0

## 2017-01-24 MED FILL — !TRUE METRIX BLOOD GLUCOSE: 30 days supply | Qty: 1 | Fill #0

## 2017-01-24 MED FILL — TRUE METRIX TEST STRIP: 30 days supply | Qty: 100 | Fill #0

## 2017-01-24 MED FILL — ?LEVOFLOXACIN 750 MG TAB: 750 | 5 days supply | Qty: 5 | Fill #0

## 2017-01-24 MED FILL — TRUEplus LANCETS 28G MISC: 30 days supply | Qty: 100 | Fill #0

## 2017-01-24 MED FILL — TRUEPLUS SYR 0.3ML 31GX5/16: 31G X 5/16" | 30 days supply | Qty: 100 | Fill #0

## 2017-02-11 ENCOUNTER — Encounter (INDEPENDENT_AMBULATORY_CARE_PROVIDER_SITE_OTHER): Payer: Self-pay | Admitting: Physician Assistant

## 2017-02-11 ENCOUNTER — Ambulatory Visit (INDEPENDENT_AMBULATORY_CARE_PROVIDER_SITE_OTHER): Payer: Self-pay | Admitting: Physician Assistant

## 2017-02-11 VITALS — BP 111/66 | HR 69 | Temp 97.1°F | Resp 18 | Ht 60.0 in | Wt 185.0 lb

## 2017-02-11 DIAGNOSIS — Z1239 Encounter for other screening for malignant neoplasm of breast: Secondary | ICD-10-CM

## 2017-02-11 DIAGNOSIS — N898 Other specified noninflammatory disorders of vagina: Secondary | ICD-10-CM

## 2017-02-11 DIAGNOSIS — N644 Mastodynia: Secondary | ICD-10-CM

## 2017-02-11 DIAGNOSIS — E118 Type 2 diabetes mellitus with unspecified complications: Secondary | ICD-10-CM

## 2017-02-11 DIAGNOSIS — Z1231 Encounter for screening mammogram for malignant neoplasm of breast: Secondary | ICD-10-CM

## 2017-02-11 LAB — GLUCOSE, POCT (MANUAL RESULT ENTRY): POC Glucose: 119 mg/dl — AB (ref 70–99)

## 2017-02-11 MED ORDER — INSULIN GLARGINE 100 UNIT/ML ~~LOC~~ SOLN: 15.0000 [IU] | Freq: Every day | SUBCUTANEOUS | 5 refills | Status: DC

## 2017-02-11 MED ORDER — "INSULIN SYRINGE 31G X 5/16"" 0.3 ML MISC": 1.0000 [IU] | 5 refills | Status: DC

## 2017-02-11 MED ORDER — NAPROXEN 500 MG PO TABS: 500.0000 mg | ORAL_TABLET | Freq: Two times a day (BID) | ORAL | 0 refills | Status: DC

## 2017-02-11 MED ORDER — METFORMIN HCL 500 MG PO TABS: 1000.0000 mg | ORAL_TABLET | Freq: Two times a day (BID) | ORAL | 11 refills | Status: DC

## 2017-02-11 MED FILL — TRUEPLUS SYR 0.3ML 31GX5/16: 31G X 5/16" | 100 days supply | Qty: 100 | Fill #0

## 2017-02-11 MED FILL — metFORMIN HCL 500 MG TABS: 500 | 30 days supply | Qty: 120 | Fill #0

## 2017-02-11 MED FILL — NAPROXEN 500 MG TABLET: 500 | 15 days supply | Qty: 30 | Fill #0

## 2017-02-11 NOTE — Progress Notes (Signed)
Subjective:  Patient ID: Denise Mosley, female    DOB: [birth year]  Age: 56 y.o. MRN: [id number]  CC: hospital f/u  HPI Denise Mosley is a 56 y.o. female with a recent PMH of DM2 presents to establish care. Admitted to [facility] on 01/14/17 and discharged on 01/23/17. Diagnosed with pneumonia and DM2. A1c of 12.3% found on 01/15/17. Currently taking metformin 1000 mg BID and Insulin Glargine 15 units qhs. Glucometer readings from 100-140. Currently feels well. She has also stopped coughing. Concern today is of occasional left breast pain involving the nipple. No erythema, edema, or nipple discharge. Has not taken anything for relief. Last mammogram "many years ago". Also says she has some redness in the vulvar region since her discharge from hospital. Feels some burning/itching vaginally. Denies vaginal discharge. Has not taken anything for relief. Would also like a refill of medications. Does not endorse any other symptoms.      Outpatient Medications Prior to Visit  Medication Sig Dispense Refill  . acetaminophen (TYLENOL) 500 MG tablet Take 1,000 mg by mouth every 6 (six) hours as needed for mild pain.     . blood glucose meter kit and supplies Dispense based on patient and insurance preference. Use up to four times daily as directed. (E11.8). 1 each 0  . insulin glargine (LANTUS) 100 UNIT/ML injection Inject 0.15 mLs (15 Units total) into the skin at bedtime. 10 mL 0  . Insulin Syringe-Needle U-100 (INSULIN SYRINGE .3CC/31GX5/16") 31G X 5/16" 0.3 ML MISC 1 Units by Does not apply route as directed. 100 each 0  . metFORMIN (GLUCOPHAGE) 500 MG tablet Take 2 tablets (1,000 mg total) by mouth 2 (two) times daily with a meal. 120 tablet 0   No facility-administered medications prior to visit.      ROS Review of Systems  Constitutional: Negative for chills, fever and malaise/fatigue.  Eyes: Negative for blurred vision.  Respiratory: Negative for shortness of breath.    Cardiovascular: Negative for chest pain and palpitations.  Gastrointestinal: Negative for abdominal pain and nausea.  Genitourinary: Negative for dysuria and hematuria.       Vaginal burning/itching  Musculoskeletal: Negative for joint pain and myalgias.  Skin: Negative for rash.       Left breast pain  Neurological: Negative for tingling and headaches.  Psychiatric/Behavioral: Negative for depression. The patient is not nervous/anxious.     Objective:  BP 111/66 (BP Location: Left Arm, Patient Position: Sitting, Cuff Size: Normal)   Pulse 69   Temp 97.1 F (36.2 C) (Oral)   Resp 18   Ht 5' (1.524 m)   Wt 185 lb (83.9 kg)   SpO2 97%   BMI 36.13 kg/m   BP/Weight 02/11/2017 0/27/2536 02/17/4033  Systolic BP 742 88 -  Diastolic BP 66 45 -  Wt. (Lbs) 185 - 170.64  BMI 36.13 - 33.33      Physical Exam  Constitutional: She is oriented to person, place, and time.  Well developed, obese, NAD, polite  HENT:  Head: Normocephalic and atraumatic.  No oral thrush  Eyes: Conjunctivae are normal. No scleral icterus.  Neck: Normal range of motion. Neck supple. No thyromegaly present.  Cardiovascular: Normal rate, regular rhythm and normal heart sounds.   Pulmonary/Chest: Effort normal and breath sounds normal.  Abdominal: Soft. Bowel sounds are normal. There is no tenderness.  Genitourinary:  Genitourinary Comments: Vaginal discharge is white, semi viscous, and with no odor.  Musculoskeletal: She exhibits no edema.  Neurological: She is alert  and oriented to person, place, and time. No cranial nerve deficit. Coordination normal.  Skin: Skin is warm and dry. No rash noted. No erythema. No pallor.  Left breast without erythema, edema, tenderness, or nipple discharge  Psychiatric: She has a normal mood and affect. Her behavior is normal. Thought content normal.  Vitals reviewed.    Assessment & Plan:   1. Type 2 diabetes mellitus with complication, without long-term current use of  insulin (HCC) - Glucose (CBG) 119 in clinic today. - Refill metFORMIN (GLUCOPHAGE) 500 MG tablet; Take 2 tablets (1,000 mg total) by mouth 2 (two) times daily with a meal.  Dispense: 120 tablet; Refill: 11 - insulin glargine (LANTUS) 100 UNIT/ML injection; Inject 0.15 mLs (15 Units total) into the skin at bedtime.  Dispense: 10 mL; Refill: 5 - Insulin Syringe-Needle U-100 (INSULIN SYRINGE .3CC/31GX5/16") 31G X 5/16" 0.3 ML MISC; 1 Units by Does not apply route as directed.  Dispense: 100 each; Refill: 5  2. Screening for breast cancer - MM DIGITAL SCREENING BILATERAL; Future  3. Mastalgia - naproxen (NAPROSYN) 500 MG tablet; Take 1 tablet (500 mg total) by mouth 2 (two) times daily with a meal.  Dispense: 30 tablet; Refill: 0  4. Vaginal discharge - Wet prep, genital   Meds ordered this encounter  Medications  . metFORMIN (GLUCOPHAGE) 500 MG tablet    Sig: Take 2 tablets (1,000 mg total) by mouth 2 (two) times daily with a meal.    Dispense:  120 tablet    Refill:  11    Order Specific Question:   Supervising Provider    Answer:   Tresa Garter [[id number]]  . insulin glargine (LANTUS) 100 UNIT/ML injection    Sig: Inject 0.15 mLs (15 Units total) into the skin at bedtime.    Dispense:  10 mL    Refill:  5    Order Specific Question:   Supervising Provider    Answer:   Tresa Garter [id number]  . Insulin Syringe-Needle U-100 (INSULIN SYRINGE .3CC/31GX5/16") 31G X 5/16" 0.3 ML MISC    Sig: 1 Units by Does not apply route as directed.    Dispense:  100 each    Refill:  5    Order Specific Question:   Supervising Provider    Answer:   Tresa Garter [id number]  . naproxen (NAPROSYN) 500 MG tablet    Sig: Take 1 tablet (500 mg total) by mouth 2 (two) times daily with a meal.    Dispense:  30 tablet    Refill:  0    Order Specific Question:   Supervising Provider    Answer:   Tresa Garter [[id number]]    Follow-up: Return in about 3 months (around 05/14/2017)  for DM f/u.   Clent Demark PA

## 2017-02-11 NOTE — Patient Instructions (Signed)
La diabetes mellitus y los alimentos (Diabetes Mellitus and Food) Es importante que controle su nivel de azcar en la sangre (glucosa). El nivel de glucosa en sangre depende en gran medida de lo que usted come. Comer alimentos saludables en las cantidades adecuadas a lo largo del da, aproximadamente a la misma hora todos los das, lo ayudar a controlar su nivel de glucosa en sangre. Tambin puede ayudarlo a retrasar o evitar el empeoramiento de la diabetes mellitus. Comer de manera saludable incluso puede ayudarlo a mejorar el nivel de presin arterial y a alcanzar o mantener un peso saludable. Entre las recomendaciones generales para alimentarse y cocinar los alimentos de forma saludable, se incluyen las siguientes:  Respetar las comidas principales y comer colaciones con regularidad. Evitar pasar largos perodos sin comer con el fin de perder peso.  Seguir una dieta que consista principalmente en alimentos de origen vegetal, como frutas, vegetales, frutos secos, legumbres y cereales integrales.  Utilizar mtodos de coccin a baja temperatura, como hornear, en lugar de mtodos de coccin a alta temperatura, como frer en abundante aceite. Trabaje con el nutricionista para aprender a usar la informacin nutricional de las etiquetas de los alimentos. CMO PUEDEN AFECTARME LOS ALIMENTOS? Carbohidratos Los carbohidratos afectan el nivel de glucosa en sangre ms que cualquier otro tipo de alimento. El nutricionista lo ayudar a determinar cuntos carbohidratos puede consumir en cada comida y ensearle a contarlos. El recuento de carbohidratos es importante para mantener la glucosa en sangre en un nivel saludable, en especial si utiliza insulina o toma determinados medicamentos para la diabetes mellitus. Alcohol El alcohol puede provocar disminuciones sbitas de la glucosa en sangre (hipoglucemia), en especial si utiliza insulina o toma determinados medicamentos para la diabetes mellitus. La  hipoglucemia es una afeccin que puede poner en peligro la vida. Los sntomas de la hipoglucemia (somnolencia, mareos y desorientacin) son similares a los sntomas de haber consumido mucho alcohol. Si el mdico lo autoriza a beber alcohol, hgalo con moderacin y siga estas pautas:  Las mujeres no deben beber ms de un trago por da, y los hombres no deben beber ms de dos tragos por da. Un trago es igual a:  12 onzas (355 ml) de cerveza  5 onzas de vino (150 ml) de vino  1,5onzas (45ml) de bebidas espirituosas  No beba con el estmago vaco.  Mantngase hidratado. Beba agua, gaseosas dietticas o t helado sin azcar.  Las gaseosas comunes, los jugos y otros refrescos podran contener muchos carbohidratos y se deben contar. QU ALIMENTOS NO SE RECOMIENDAN? Cuando haga las elecciones de alimentos, es importante que recuerde que todos los alimentos son distintos. Algunos tienen menos nutrientes que otros por porcin, aunque podran tener la misma cantidad de caloras o carbohidratos. Es difcil darle al cuerpo lo que necesita cuando consume alimentos con menos nutrientes. Estos son algunos ejemplos de alimentos que debera evitar ya que contienen muchas caloras y carbohidratos, pero pocos nutrientes:  Grasas trans (la mayora de los alimentos procesados incluyen grasas trans en la etiqueta de Informacin nutricional).  Gaseosas comunes.  Jugos.  Caramelos.  Dulces, como tortas, pasteles, rosquillas y galletas.  Comidas fritas. QU ALIMENTOS PUEDO COMER? Consuma alimentos ricos en nutrientes, que nutrirn el cuerpo y lo mantendrn saludable. Los alimentos que debe comer tambin dependern de varios factores, como:  Las caloras que necesita.  Los medicamentos que toma.  Su peso.  El nivel de glucosa en sangre.  El nivel de presin arterial.  El nivel de colesterol. Debe consumir   una amplia variedad de alimentos, por ejemplo:  Protenas.  Cortes de carne  magros.  Protenas con bajo contenido de grasas saturadas, como pescado, clara de huevo y frijoles. Evite las carnes procesadas.  Frutas y vegetales.  Frutas y vegetales que pueden ayudar a controlar los niveles sanguneos de glucosa, como manzanas, mangos y batatas.  Productos lcteos.  Elija productos lcteos sin grasa o con bajo contenido de grasa, como leche, yogur y queso.  Cereales, panes, pastas y arroz.  Elija cereales integrales, como panes multicereales, avena en grano y arroz integral. Estos alimentos pueden ayudar a controlar la presin arterial.  Grasas.  Alimentos que contengan grasas saludables, como frutos secos, aguacate, aceite de oliva, aceite de canola y pescado. TODOS LOS QUE PADECEN DIABETES MELLITUS TIENEN EL MISMO PLAN DE COMIDAS? Dado que todas las personas que padecen diabetes mellitus son distintas, no hay un solo plan de comidas que funcione para todos. Es muy importante que se rena con un nutricionista que lo ayudar a crear un plan de comidas adecuado para usted. Esta informacin no tiene como fin reemplazar el consejo del mdico. Asegrese de hacerle al mdico cualquier pregunta que tenga. Document Released: 12/10/2007 Document Revised: 09/23/2014 Document Reviewed: 07/30/2013 Elsevier Interactive Patient Education  2017 Elsevier Inc.  

## 2017-02-11 NOTE — Progress Notes (Signed)
Patient is here for HFU  Patient denies pain at this time.  Patient has taken medication today. Patient has eaten today. 

## 2017-02-13 LAB — VAGINITIS/VAGINOSIS, DNA PROBE
Candida Species: NEGATIVE
GARDNERELLA VAGINALIS: NEGATIVE
Trichomonas vaginosis: NEGATIVE

## 2017-02-13 LAB — WET PREP, GENITAL

## 2017-03-17 ENCOUNTER — Other Ambulatory Visit: Payer: Self-pay | Admitting: *Deleted

## 2017-03-17 MED ORDER — INSULIN DETEMIR 100 UNIT/ML ~~LOC~~ SOLN: 15.0000 [IU] | Freq: Every day | SUBCUTANEOUS | 3 refills | Status: DC

## 2017-03-17 NOTE — Telephone Encounter (Signed)
Patient ineligible for PASS.

## 2017-05-14 ENCOUNTER — Ambulatory Visit (INDEPENDENT_AMBULATORY_CARE_PROVIDER_SITE_OTHER): Payer: Self-pay | Admitting: Physician Assistant

## 2017-05-14 ENCOUNTER — Encounter (INDEPENDENT_AMBULATORY_CARE_PROVIDER_SITE_OTHER): Payer: Self-pay | Admitting: Physician Assistant

## 2017-05-14 VITALS — BP 112/73 | HR 83 | Temp 98.0°F | Wt 193.4 lb

## 2017-05-14 DIAGNOSIS — E118 Type 2 diabetes mellitus with unspecified complications: Secondary | ICD-10-CM

## 2017-05-14 DIAGNOSIS — J329 Chronic sinusitis, unspecified: Secondary | ICD-10-CM

## 2017-05-14 LAB — POCT GLYCOSYLATED HEMOGLOBIN (HGB A1C): HEMOGLOBIN A1C: 8

## 2017-05-14 MED ORDER — AZITHROMYCIN 250 MG PO TABS: ORAL_TABLET | ORAL | 0 refills | Status: DC

## 2017-05-14 MED ORDER — GLIMEPIRIDE 4 MG PO TABS: 4.0000 mg | ORAL_TABLET | Freq: Every day | ORAL | 3 refills | Status: DC

## 2017-05-14 MED ORDER — PREDNISONE 20 MG PO TABS: 40.0000 mg | ORAL_TABLET | Freq: Every day | ORAL | 0 refills | Status: DC

## 2017-05-14 MED ORDER — METFORMIN HCL 500 MG PO TABS: 1000.0000 mg | ORAL_TABLET | Freq: Two times a day (BID) | ORAL | 11 refills | Status: DC

## 2017-05-14 MED FILL — ?PREDNISONE 20 MG TABLET: 20 | 5 days supply | Qty: 10 | Fill #0

## 2017-05-14 MED FILL — GLIMEPIRIDE 4 MG TABLET: 4 | 30 days supply | Qty: 30 | Fill #0

## 2017-05-14 MED FILL — AZITHROMYCIN 250 MG TABLET: 250 | 5 days supply | Qty: 6 | Fill #0

## 2017-05-14 MED FILL — ?METFORMIN HCL 500MG TABLET: 500 | 30 days supply | Qty: 120 | Fill #0

## 2017-05-14 NOTE — Patient Instructions (Signed)

## 2017-05-14 NOTE — Progress Notes (Signed)
Subjective:  Patient ID: Denise Mosley, female    DOB: 1960-10-13  Age: 56 y.o. MRN: [id number]  CC: f/u DM  HPI  Fred Hammes is a 56 y.o. female with a recent PMH of DM2 presents to f/u on DM2. A1c 12.3% on 01/15/17. A1C 8.0% in clinic today. Had been prescribed Lantus but pt reports using for only two months. Has not used in the last month because she did not want to continue to use insulin. Continues to take Metformin 1000 mg.     Patient also complains of nasal congestions and "allergies" x15 days. Attributed to having direct contact with a fan or air conditioner. No close contacts with the same. Does not endorse f/c/n/v, rash, CP, SOB, HA, abdominal pain, or GI/GU sxs.      Outpatient Medications Prior to Visit  Medication Sig Dispense Refill  . acetaminophen (TYLENOL) 500 MG tablet Take 1,000 mg by mouth every 6 (six) hours as needed for mild pain.     . blood glucose meter kit and supplies Dispense based on patient and insurance preference. Use up to four times daily as directed. (E11.8). 1 each 0  . insulin detemir (LEVEMIR) 100 UNIT/ML injection Inject 0.15 mLs (15 Units total) into the skin at bedtime. 10 mL 3  . insulin glargine (LANTUS) 100 UNIT/ML injection Inject 0.15 mLs (15 Units total) into the skin at bedtime. 10 mL 5  . Insulin Syringe-Needle U-100 (INSULIN SYRINGE .3CC/31GX5/16") 31G X 5/16" 0.3 ML MISC 1 Units by Does not apply route as directed. 100 each 5  . metFORMIN (GLUCOPHAGE) 500 MG tablet Take 2 tablets (1,000 mg total) by mouth 2 (two) times daily with a meal. 120 tablet 11  . naproxen (NAPROSYN) 500 MG tablet Take 1 tablet (500 mg total) by mouth 2 (two) times daily with a meal. 30 tablet 0   No facility-administered medications prior to visit.      ROS Review of Systems  Constitutional: Negative for chills, fever and malaise/fatigue.  HENT: Positive for congestion. Negative for sinus pain.   Eyes: Negative for blurred vision.  Respiratory:  Negative for shortness of breath.   Cardiovascular: Negative for chest pain and palpitations.  Gastrointestinal: Negative for abdominal pain and nausea.  Genitourinary: Negative for dysuria and hematuria.  Musculoskeletal: Negative for joint pain and myalgias.  Skin: Negative for rash.  Neurological: Negative for tingling and headaches.  Psychiatric/Behavioral: Negative for depression. The patient is not nervous/anxious.     Objective:  BP 112/73 (BP Location: Left Arm, Patient Position: Sitting, Cuff Size: Large)   Pulse 83   Temp 98 F (36.7 C) (Oral)   Wt 193 lb 6.4 oz (87.7 kg)   SpO2 94%   BMI 37.77 kg/m   BP/Weight 05/14/2017 02/11/2017 3/55/9741  Systolic BP 638 453 88  Diastolic BP 73 66 45  Wt. (Lbs) 193.4 185 -  BMI 37.77 36.13 -      Physical Exam  Constitutional: She is oriented to person, place, and time.  Well developed, obese, NAD, polite  HENT:  Head: Normocephalic and atraumatic.  Turbinates hypertrophic. Mild right sided maxillary sinus TTP.  Eyes: No scleral icterus.  Neck: Normal range of motion. Neck supple. No thyromegaly present.  Cardiovascular: Normal rate, regular rhythm and normal heart sounds.   Pulmonary/Chest: Effort normal and breath sounds normal.  I phase <  E phase  Musculoskeletal: She exhibits no edema.  Lymphadenopathy:    She has no cervical adenopathy.  Neurological: She is alert  and oriented to person, place, and time. No cranial nerve deficit. Coordination normal.  Skin: Skin is warm and dry. No rash noted. No erythema. No pallor.  Psychiatric: She has a normal mood and affect. Her behavior is normal. Thought content normal.  Vitals reviewed.    Assessment & Plan:    1. Type 2 diabetes mellitus with complication, without long-term current use of insulin (HCC) - HgB A1c 8.0% in clinic today. - Refill metFORMIN (GLUCOPHAGE) 500 MG tablet; Take 2 tablets (1,000 mg total) by mouth 2 (two) times daily with a meal.  Dispense: 120  tablet; Refill: 11 - Begin glimepiride (AMARYL) 4 MG tablet; Take 1 tablet (4 mg total) by mouth daily before breakfast.  Dispense: 30 tablet; Refill: 3 - Stop Lantus  2. Chronic sinusitis, unspecified location - Begin azithromycin (ZITHROMAX) 250 MG tablet; Take two tablets on first day then one tablet daily thereafter.  Dispense: 6 tablet; Refill: 0 - Begin predniSONE (DELTASONE) 20 MG tablet; Take 2 tablets (40 mg total) by mouth daily with breakfast.  Dispense: 10 tablet; Refill: 0   Meds ordered this encounter  Medications  . metFORMIN (GLUCOPHAGE) 500 MG tablet    Sig: Take 2 tablets (1,000 mg total) by mouth 2 (two) times daily with a meal.    Dispense:  120 tablet    Refill:  11    Order Specific Question:   Supervising Provider    Answer:   Tresa Garter [[id number]]  . glimepiride (AMARYL) 4 MG tablet    Sig: Take 1 tablet (4 mg total) by mouth daily before breakfast.    Dispense:  30 tablet    Refill:  3    Order Specific Question:   Supervising Provider    Answer:   Tresa Garter [id number]  . azithromycin (ZITHROMAX) 250 MG tablet    Sig: Take two tablets on first day then one tablet daily thereafter.    Dispense:  6 tablet    Refill:  0    Order Specific Question:   Supervising Provider    Answer:   Tresa Garter [id number]  . predniSONE (DELTASONE) 20 MG tablet    Sig: Take 2 tablets (40 mg total) by mouth daily with breakfast.    Dispense:  10 tablet    Refill:  0    Order Specific Question:   Supervising Provider    Answer:   Tresa Garter [[id number]]    Follow-up: Return in about 3 months (around 08/14/2017) for DM2. and for full physical.  Clent Demark PA

## 2017-07-31 ENCOUNTER — Encounter (INDEPENDENT_AMBULATORY_CARE_PROVIDER_SITE_OTHER): Payer: Self-pay | Admitting: Physician Assistant

## 2017-07-31 ENCOUNTER — Ambulatory Visit (INDEPENDENT_AMBULATORY_CARE_PROVIDER_SITE_OTHER): Payer: Self-pay | Admitting: Physician Assistant

## 2017-07-31 VITALS — BP 130/84 | HR 86 | Temp 97.9°F | Wt 189.2 lb

## 2017-07-31 DIAGNOSIS — R059 Cough, unspecified: Secondary | ICD-10-CM

## 2017-07-31 DIAGNOSIS — J189 Pneumonia, unspecified organism: Secondary | ICD-10-CM

## 2017-07-31 DIAGNOSIS — J181 Lobar pneumonia, unspecified organism: Secondary | ICD-10-CM

## 2017-07-31 DIAGNOSIS — R05 Cough: Secondary | ICD-10-CM

## 2017-07-31 MED ORDER — LEVOFLOXACIN 750 MG PO TABS: 750.0000 mg | ORAL_TABLET | Freq: Every day | ORAL | 0 refills | Status: AC

## 2017-07-31 MED ORDER — NAPROXEN 500 MG PO TABS: 500.0000 mg | ORAL_TABLET | Freq: Two times a day (BID) | ORAL | 0 refills | Status: DC

## 2017-07-31 MED ORDER — PHENYLEPHRINE-DM-GG-APAP 5-10-200-325 MG/10ML PO LIQD: 20.0000 mL | ORAL | 0 refills | Status: AC

## 2017-07-31 MED FILL — NAPROXEN 500 MG TABLET: 500 | 15 days supply | Qty: 30 | Fill #0

## 2017-07-31 MED FILL — ?LEVOFLOXACIN 750 MG TAB: 750 | 5 days supply | Qty: 5 | Fill #0

## 2017-07-31 NOTE — Progress Notes (Signed)
Subjective:  Patient ID: Denise Mosley, female    DOB: [birth year]  Age: 56 y.o. MRN: [id number]  CC:   HPI Ruthann Angulo is a 56 y.o. female with a medical history of DM2 and HTN presents with complaint of chest congestion, cough, and fever x1 week. Becomes short of breath when she goes outside. Has taken medication from the [facility] called "hermaxol". Has also been drinking herbal teas with mild relief of symptoms. No close contacts with the same. Does not endorse facial pain, sore throat, ear pain, abdominal pain, chest pain, palpitations, rash, or GI/GU sxs.       Outpatient Medications Prior to Visit  Medication Sig Dispense Refill  . acetaminophen (TYLENOL) 500 MG tablet Take 1,000 mg by mouth every 6 (six) hours as needed for mild pain.     Marland Kitchen azithromycin (ZITHROMAX) 250 MG tablet Take two tablets on first day then one tablet daily thereafter. 6 tablet 0  . blood glucose meter kit and supplies Dispense based on patient and insurance preference. Use up to four times daily as directed. (E11.8). 1 each 0  . glimepiride (AMARYL) 4 MG tablet Take 1 tablet (4 mg total) by mouth daily before breakfast. 30 tablet 3  . metFORMIN (GLUCOPHAGE) 500 MG tablet Take 2 tablets (1,000 mg total) by mouth 2 (two) times daily with a meal. 120 tablet 11  . naproxen (NAPROSYN) 500 MG tablet Take 1 tablet (500 mg total) by mouth 2 (two) times daily with a meal. 30 tablet 0  . predniSONE (DELTASONE) 20 MG tablet Take 2 tablets (40 mg total) by mouth daily with breakfast. 10 tablet 0   No facility-administered medications prior to visit.      ROS Review of Systems  Constitutional: Positive for fever and malaise/fatigue. Negative for chills.  Eyes: Negative for blurred vision.  Respiratory: Positive for cough and shortness of breath.   Cardiovascular: Negative for chest pain and palpitations.  Gastrointestinal: Negative for abdominal pain and nausea.  Genitourinary: Negative for dysuria  and hematuria.  Musculoskeletal: Negative for joint pain and myalgias.  Skin: Negative for rash.  Neurological: Negative for tingling and headaches.  Psychiatric/Behavioral: Negative for depression. The patient is not nervous/anxious.     Objective:  BP 130/84 (BP Location: Left Arm, Patient Position: Sitting, Cuff Size: Large)   Pulse 86   Temp 97.9 F (36.6 C) (Oral)   Wt 189 lb 3.2 oz (85.8 kg)   SpO2 93%   BMI 36.95 kg/m   BP/Weight 07/31/2017 05/14/2017 03/13/6380  Systolic BP 771 165 790  Diastolic BP 84 73 66  Wt. (Lbs) 189.2 193.4 185  BMI 36.95 37.77 36.13      Physical Exam  Constitutional: She is oriented to person, place, and time.  Well developed, well nourished, NAD, polite  HENT:  Head: Normocephalic and atraumatic.  Eyes: No scleral icterus.  Neck: Normal range of motion. Neck supple. No thyromegaly present.  Cardiovascular: Normal rate, regular rhythm and normal heart sounds.  Pulmonary/Chest: Effort normal. No respiratory distress. She has no wheezes. She has rales (right middle lobe crackles).  Abdominal: Soft. Bowel sounds are normal. There is no tenderness.  Musculoskeletal: She exhibits no edema.  Neurological: She is alert and oriented to person, place, and time. No cranial nerve deficit. Coordination normal.  Skin: Skin is warm and dry. No rash noted. No erythema. No pallor.  Psychiatric: She has a normal mood and affect. Her behavior is normal. Thought content normal.  Vitals reviewed.  Assessment & Plan:   1. Pneumonia of right middle lobe due to infectious organism (HCC) - Begin Levofloxacin 750 mg qday x5 days - Begin Naproxen 500 mg  2. Cough - Begin Levofloxacin 750 mg qday x5 days - Begin Naproxen 500 mg - Begin Mucinex FastMax  Meds ordered this encounter  Medications  . levofloxacin (LEVAQUIN) 750 MG tablet    Sig: Take 1 tablet (750 mg total) daily for 5 days by mouth.    Dispense:  5 tablet    Refill:  0    Order Specific  Question:   Supervising Provider    Answer:   Tresa Garter [id number]  . naproxen (NAPROSYN) 500 MG tablet    Sig: Take 1 tablet (500 mg total) 2 (two) times daily with a meal by mouth.    Dispense:  30 tablet    Refill:  0    Order Specific Question:   Supervising Provider    Answer:   Tresa Garter [id number]  . Phenylephrine-DM-GG-APAP (MUCINEX FAST-MAX COLD FLU) 5-10-200-325 MG/10ML LIQD    Sig: Take 20 mLs every 4 (four) hours for 5 days by mouth.    Dispense:  1 Bottle    Refill:  0    Order Specific Question:   Supervising Provider    Answer:   Tresa Garter [id number]    Follow-up: Return if symptoms worsen or fail to improve.   Clent Demark PA

## 2017-07-31 NOTE — Patient Instructions (Addendum)
Por favor suspenda el glimepiride durante el uso de la levofloxacina. Puede comencar glimepiride de nuevo despues de terminar la levofloxacina.   Community-Acquired Pneumonia, Adult Pneumonia is an infection of the lungs. One type of pneumonia can happen while a person is in a hospital. A different type can happen when a person is not in a hospital (community-acquired pneumonia). It is easy for this kind to spread from person to person. It can spread to you if you breathe near an infected person who coughs or sneezes. Some symptoms include:  A dry cough.  A wet (productive) cough.  Fever.  Sweating.  Chest pain.  Follow these instructions at home:  Take over-the-counter and prescription medicines only as told by your doctor. ? Only take cough medicine if you are losing sleep. ? If you were prescribed an antibiotic medicine, take it as told by your doctor. Do not stop taking the antibiotic even if you start to feel better.  Sleep with your head and neck raised (elevated). You can do this by putting a few pillows under your head, or you can sleep in a recliner.  Do not use tobacco products. These include cigarettes, chewing tobacco, and e-cigarettes. If you need help quitting, ask your doctor.  Drink enough water to keep your pee (urine) clear or pale yellow. A shot (vaccine) can help prevent pneumonia. Shots are often suggested for:  People older than 54 years of age.  People older than 54 years of age: ? Who are having cancer treatment. ? Who have long-term (chronic) lung disease. ? Who have problems with their body's defense system (immune system).  You may also prevent pneumonia if you take these actions:  Get the flu (influenza) shot every year.  Go to the dentist as often as told.  Wash your hands often. If soap and water are not available, use hand sanitizer.  Contact a doctor if:  You have a fever.  You lose sleep because your cough medicine does not help. Get  help right away if:  You are short of breath and it gets worse.  You have more chest pain.  Your sickness gets worse. This is very serious if: ? You are an older adult. ? Your body's defense system is weak.  You cough up blood. This information is not intended to replace advice given to you by your health care provider. Make sure you discuss any questions you have with your health care provider. Document Released: 02/19/2008 Document Revised: 02/08/2016 Document Reviewed: 12/28/2014 Elsevier Interactive Patient Education  [employer]2018 [employer] Inc.

## 2017-08-14 ENCOUNTER — Encounter (INDEPENDENT_AMBULATORY_CARE_PROVIDER_SITE_OTHER): Payer: Self-pay | Admitting: Physician Assistant

## 2017-08-14 ENCOUNTER — Ambulatory Visit (INDEPENDENT_AMBULATORY_CARE_PROVIDER_SITE_OTHER): Payer: Self-pay | Admitting: Physician Assistant

## 2017-08-14 ENCOUNTER — Other Ambulatory Visit (HOSPITAL_COMMUNITY)
Admission: RE | Admit: 2017-08-14 | Discharge: 2017-08-14 | Disposition: A | Discharge status: Home / Self Care | Payer: Self-pay | Source: Ambulatory Visit | Attending: Physician Assistant | Admitting: Physician Assistant

## 2017-08-14 VITALS — BP 111/74 | HR 66 | Temp 97.4°F | Ht <= 58 in | Wt 186.8 lb

## 2017-08-14 DIAGNOSIS — N939 Abnormal uterine and vaginal bleeding, unspecified: Secondary | ICD-10-CM

## 2017-08-14 DIAGNOSIS — Z794 Long term (current) use of insulin: Secondary | ICD-10-CM

## 2017-08-14 DIAGNOSIS — Z124 Encounter for screening for malignant neoplasm of cervix: Secondary | ICD-10-CM

## 2017-08-14 DIAGNOSIS — E119 Type 2 diabetes mellitus without complications: Secondary | ICD-10-CM

## 2017-08-14 DIAGNOSIS — Z1159 Encounter for screening for other viral diseases: Secondary | ICD-10-CM

## 2017-08-14 DIAGNOSIS — Z1211 Encounter for screening for malignant neoplasm of colon: Secondary | ICD-10-CM

## 2017-08-14 DIAGNOSIS — A599 Trichomoniasis, unspecified: Secondary | ICD-10-CM

## 2017-08-14 DIAGNOSIS — B356 Tinea cruris: Secondary | ICD-10-CM

## 2017-08-14 DIAGNOSIS — Z0000 Encounter for general adult medical examination without abnormal findings: Secondary | ICD-10-CM

## 2017-08-14 DIAGNOSIS — Z1239 Encounter for other screening for malignant neoplasm of breast: Secondary | ICD-10-CM

## 2017-08-14 DIAGNOSIS — Z1231 Encounter for screening mammogram for malignant neoplasm of breast: Secondary | ICD-10-CM

## 2017-08-14 LAB — POCT GLYCOSYLATED HEMOGLOBIN (HGB A1C): Hemoglobin A1C: 13.1

## 2017-08-14 MED ORDER — METRONIDAZOLE 500 MG PO TABS: 500.0000 mg | ORAL_TABLET | Freq: Two times a day (BID) | ORAL | 0 refills | Status: AC

## 2017-08-14 MED ORDER — INSULIN SYRINGES (DISPOSABLE) U-100 1 ML MISC: 1.0000 | Freq: Every day | 3 refills | Status: DC

## 2017-08-14 MED ORDER — INSULIN GLARGINE 100 UNIT/ML ~~LOC~~ SOLN
25.0000 [IU] | Freq: Every day | SUBCUTANEOUS | 11 refills | Status: DC
Start: 2017-08-14 — End: 2017-09-22

## 2017-08-14 MED ORDER — FLUCONAZOLE 150 MG PO TABS: 150.0000 mg | ORAL_TABLET | ORAL | 0 refills | Status: DC

## 2017-08-14 MED FILL — TRUEPLUS SYR 0.5ML 31GX5/16: 31G X 5/16" | 30 days supply | Qty: 100 | Fill #0

## 2017-08-14 MED FILL — FLUCONAZOLE 150 MG TABLET: 150 | 21 days supply | Qty: 3 | Fill #0

## 2017-08-14 MED FILL — !LANTUS 100 UNITS/ML VIAL: 100 | 30 days supply | Qty: 10 | Fill #0

## 2017-08-14 MED FILL — metroNIDAZOLE 500 MG TABS: 500 | 7 days supply | Qty: 14 | Fill #0

## 2017-08-14 NOTE — Patient Instructions (Signed)
Tricomoniasis  (Trichomoniasis)  La tricomoniasis es una infeccin causada por un microorganismo llamado tricomonas. La infeccin puede afectar tanto a las mujeres como a los hombres. En las mujeres, afecta los rganos genitales externos y la vagina. En los hombres, afecta principalmente al pene, pero tambin puede estar involucrada la prstata y otros rganos reproductivos. La tricomoniasis es una enfermedad de transmisin sexual (ETS) y la mayora de las veces se transmite de una persona a otra a travs del contacto sexual.  FACTORES DE RIESGO   Tener relaciones sexuales sin proteccin.   Tener relaciones sexuales con una pareja infectada.    SIGNOS Y SNTOMAS  En las mujeres, los sntomas de tricomoniasis incluyen lo siguiente:   Secrecin vaginal espumosa gris verdosa anormal.   Picazn e irritacin en la vagina.   Picazn e irritacin en la zona externa de la vagina.  En los hombres, los sntomas de tricomoniasis incluyen lo siguiente:   Secrecin del pene con o sin dolor.   Dolor al orinar que es consecuencia de la inflamacin de la uretra.  DIAGNSTICO  La tricomoniasis puede detectarse durante un [place] o un examen fsico. El mdico puede usar uno de los siguientes mtodos para diagnosticar esta infeccin:   Anlisis del pH de la vagina con una cinta de prueba.   Un hisopado vaginal para detectar la presencia del microorganismo tricomonas. Hay una prueba disponible que arroja resultados en pocos minutos.   Examen de una muestra de orina.   Pruebas de las secreciones vaginales.  El mdico puede hacerle pruebas de deteccin de otras enfermedades de transmisin sexual (ETS), incluido el VIH.  TRATAMIENTO   Pueden indicarle medicamentos para combatir la infeccin. Las mujeres deben informar al mdico si podran estar embarazadas o si lo estn. Algunos medicamentos utilizados para tratar la infeccin no deben tomarse durante el embarazo.   El mdico puede recomendarle medicamentos o cremas de  venta libre para aliviar la picazn o la irritacin.   Su pareja sexual deber recibir tratamiento si est infectado.   El mdico puede repetirle las pruebas de deteccin de infecciones 3 meses despus del tratamiento.    INSTRUCCIONES PARA EL CUIDADO EN EL HOGAR   Tome los medicamentos solamente como se lo haya indicado el mdico.   Use los medicamentos de venta libre para la picazn o la irritacin tal como el mdico le indic.   No tenga relaciones sexuales mientras tiene la infeccin.   Las mujeres no deben hacerse duchas vaginales ni usar tampones mientras tienen la infeccin.   Hable sobre la infeccin con su pareja. Es posible que su pareja se haya contagiado la infeccin de usted o viceversa.   Si es necesario, pdale a su pareja sexual que se someta a un examen y reciba tratamiento.   Practique sexo seguro y protegido.   Visite al mdico para realizarse otros estudios de enfermedades de transmisin sexual.    SOLICITE ATENCIN MDICA SI:   An tiene sntomas despus de finalizados los medicamentos.   Siente dolor abdominal.   Siente dolor al orinar.   Tiene hemorragias durante las relaciones sexuales.   Le aparece una erupcin cutnea.   El medicamento le provoca nuseas o lo hace vomitar.    ASEGRESE DE QUE:   Comprende estas instrucciones.   Controlar su afeccin.   Recibir ayuda de inmediato si no mejora o si empeora.    Esta informacin no tiene como fin reemplazar el consejo del mdico. Asegrese de hacerle al mdico cualquier pregunta que tenga.    Document Released: 06/12/2005 Document Revised: 09/23/2014 Document Reviewed: 06/14/2013  Elsevier Interactive Patient Education  2017 Elsevier Inc.

## 2017-08-14 NOTE — Progress Notes (Signed)
Subjective:  Patient ID: Denise Mosley, female    DOB: November 27, 1960  Age: 56 y.o. MRN: [id number]  CC:  Annual exam and DM2  HPI Denise Mosley is a 56 y.o. female with a medical history of DM2 and HTN presents for a f/u of DM2 and annual physical. Patient given a trial off of Lantus. A1c is elevated today at 13.1% as compared to 8.0% three months ago. Patient does not adhere to low carb diet nor does she exercise regularly. Has taken glimepiride 68m and Metformin as directed. Complains of inguinal redness and "rawness". Some visual blurring and polydipsia. No other complaints or symptoms.        Outpatient Medications Prior to Visit  Medication Sig Dispense Refill  . acetaminophen (TYLENOL) 500 MG tablet Take 1,000 mg by mouth every 6 (six) hours as needed for mild pain.     . blood glucose meter kit and supplies Dispense based on patient and insurance preference. Use up to four times daily as directed. (E11.8). 1 each 0  . glimepiride (AMARYL) 4 MG tablet Take 1 tablet (4 mg total) by mouth daily before breakfast. 30 tablet 3  . metFORMIN (GLUCOPHAGE) 500 MG tablet Take 2 tablets (1,000 mg total) by mouth 2 (two) times daily with a meal. 120 tablet 11  . naproxen (NAPROSYN) 500 MG tablet Take 1 tablet (500 mg total) 2 (two) times daily with a meal by mouth. 30 tablet 0  . predniSONE (DELTASONE) 20 MG tablet Take 2 tablets (40 mg total) by mouth daily with breakfast. 10 tablet 0   No facility-administered medications prior to visit.      ROS Review of Systems  Constitutional: Negative for chills, fever and malaise/fatigue.  Eyes: Positive for blurred vision.  Respiratory: Negative for shortness of breath.   Cardiovascular: Negative for chest pain and palpitations.  Gastrointestinal: Negative for abdominal pain and nausea.  Genitourinary: Negative for dysuria and hematuria.  Musculoskeletal: Negative for joint pain and myalgias.  Skin: Positive for rash.  Neurological:  Negative for tingling and headaches.  Endo/Heme/Allergies: Positive for polydipsia.  Psychiatric/Behavioral: Negative for depression. The patient is not nervous/anxious.     Objective:  BP 111/74 (BP Location: Left Arm, Patient Position: Sitting, Cuff Size: Large)   Pulse 66   Temp (!) 97.4 F (36.3 C) (Oral)   Ht 4' 8.5" (1.435 m)   Wt 186 lb 12.8 oz (84.7 kg)   SpO2 96%   BMI 41.14 kg/m   BP/Weight 08/14/2017 07/31/2017 84/96/7591 Systolic BP [id number] Diastolic BP 74 84 73  Wt. (Lbs) 186.8 189.2 193.4  BMI 41.14 36.95 37.77      Physical Exam  Constitutional: She is oriented to person, place, and time.  Well developed, obese, NAD, polite  HENT:  Head: Normocephalic and atraumatic.  Eyes: Conjunctivae and EOM are normal. No scleral icterus.  Neck: Normal range of motion. Neck supple. No thyromegaly present.  Cardiovascular: Normal rate, regular rhythm and normal heart sounds.  Pulmonary/Chest: Effort normal and breath sounds normal. No respiratory distress. She has no wheezes.  Abdominal: Soft. Bowel sounds are normal. She exhibits no distension. There is no tenderness.  Nonreducible hernia in the hypogastric region. Obese abdomen, unable to feel for hepatosplenomegaly.  Genitourinary:  Genitourinary Comments: Cervical polyp and small erythematous punctate lesions on cervix. No vaginal discharge. Mild right sided and moderate left sided adnexal tenderness. Can not assess uterus properly due to obese abdomen   Musculoskeletal: She exhibits no edema.  UEs, LEs, and back with full aROM.  Lymphadenopathy:    She has no cervical adenopathy.  Neurological: She is alert and oriented to person, place, and time. No cranial nerve deficit. Coordination normal.  Skin: Skin is warm and dry.  Inguinal erythema bilaterally obscured by topical cream pt applied  Psychiatric: She has a normal mood and affect. Her behavior is normal. Thought content normal.  Vitals  reviewed.    Assessment & Plan:    1. Type 2 diabetes mellitus without complication, with long-term current use of insulin (HCC) - HgB A1c 13.1% - Begin insulin glargine (LANTUS) 100 UNIT/ML injection; Inject 0.25 mLs (25 Units total) into the skin at bedtime.  Dispense: 10 mL; Refill: 11 - Begin Insulin Syringes, Disposable, U-100 1 ML MISC; 1 each by Does not apply route daily.  Dispense: 90 each; Refill: 3 - Ambulatory referral to Ophthalmology - Microalbumin / creatinine urine ratio - Foot exam normal today in clinic.  2. Annual physical exam - CBC with Differential - Comprehensive metabolic panel - Lipid panel - TSH  3. Screening for breast cancer - [place] DIGITAL SCREENING BILATERAL; Future  4. Screening for cervical cancer - Cytology - PAP [place]  5. Special screening for malignant neoplasms, colon - Fecal occult blood, imunochemical  6. Need for hepatitis C screening test - Hepatitis c antibody (reflex)  7. Tinea cruris - fluconazole (DIFLUCAN) 150 MG tablet; Take 1 tablet (150 mg total) by mouth once a week.  Dispense: 3 tablet; Refill: 0  8. Trichomoniasis - metroNIDAZOLE (FLAGYL) 500 MG tablet; Take 1 tablet (500 mg total) by mouth 2 (two) times daily for 7 days.  Dispense: 14 tablet; Refill: 0  9. Abnormal uterine bleeding (AUB) - US Pelvis Complete; Future - US Transvaginal Non-OB; Future   Meds ordered this encounter  Medications  . insulin glargine (LANTUS) 100 UNIT/ML injection    Sig: Inject 0.25 mLs (25 Units total) into the skin at bedtime.    Dispense:  10 mL    Refill:  11    Order Specific Question:   Supervising Provider    Answer:   Tresa Garter [id number]  . Insulin Syringes, Disposable, U-100 1 ML MISC    Sig: 1 each by Does not apply route daily.    Dispense:  90 each    Refill:  3    Order Specific Question:   Supervising Provider    Answer:   Tresa Garter [id number]  . metroNIDAZOLE (FLAGYL) 500 MG tablet    Sig:  Take 1 tablet (500 mg total) by mouth 2 (two) times daily for 7 days.    Dispense:  14 tablet    Refill:  0    Order Specific Question:   Supervising Provider    Answer:   Tresa Garter [id number]  . fluconazole (DIFLUCAN) 150 MG tablet    Sig: Take 1 tablet (150 mg total) by mouth once a week.    Dispense:  3 tablet    Refill:  0    Order Specific Question:   Supervising Provider    Answer:   Tresa Garter [[id number]]    Follow-up: Return in about 3 months (around 11/13/2017) for diabetes.   Clent Demark PA

## 2017-08-15 ENCOUNTER — Other Ambulatory Visit (INDEPENDENT_AMBULATORY_CARE_PROVIDER_SITE_OTHER): Payer: Self-pay | Admitting: Physician Assistant

## 2017-08-15 DIAGNOSIS — E7841 Elevated Lipoprotein(a): Secondary | ICD-10-CM

## 2017-08-15 LAB — COMPREHENSIVE METABOLIC PANEL
A/G RATIO: 1.4 (ref 1.2–2.2)
ALT: 20 IU/L (ref 0–32)
AST: 22 IU/L (ref 0–40)
Albumin: 4.5 g/dL (ref 3.5–5.5)
Alkaline Phosphatase: 244 IU/L — ABNORMAL HIGH (ref 39–117)
BILIRUBIN TOTAL: 0.3 mg/dL (ref 0.0–1.2)
BUN/Creatinine Ratio: 18 (ref 9–23)
BUN: 12 mg/dL (ref 6–24)
CALCIUM: 9.7 mg/dL (ref 8.7–10.2)
CHLORIDE: 100 mmol/L (ref 96–106)
CO2: 21 mmol/L (ref 20–29)
Creatinine, Ser: 0.65 mg/dL (ref 0.57–1.00)
GFR calc non Af Amer: 101 mL/min/{1.73_m2} (ref >59)
GFR, EST AFRICAN AMERICAN: 116 mL/min/{1.73_m2} (ref >59)
GLUCOSE: 323 mg/dL — AB (ref 65–99)
Globulin, Total: 3.3 g/dL (ref 1.5–4.5)
POTASSIUM: 4.6 mmol/L (ref 3.5–5.2)
Sodium: 140 mmol/L (ref 134–144)
TOTAL PROTEIN: 7.8 g/dL (ref 6.0–8.5)

## 2017-08-15 LAB — TSH: TSH: 2.61 u[IU]/mL (ref 0.450–4.500)

## 2017-08-15 LAB — CBC WITH DIFFERENTIAL/PLATELET
BASOS ABS: 0 10*3/uL (ref 0.0–0.2)
Basos: 0 %
EOS (ABSOLUTE): 0.3 10*3/uL (ref 0.0–0.4)
Eos: 5 %
Hematocrit: 42.2 % (ref 34.0–46.6)
Hemoglobin: 15.8 g/dL (ref 11.1–15.9)
IMMATURE GRANS (ABS): 0 10*3/uL (ref 0.0–0.1)
Immature Granulocytes: 0 %
LYMPHS: 48 %
Lymphocytes Absolute: 2.5 10*3/uL (ref 0.7–3.1)
MCH: 32.2 pg (ref 26.6–33.0)
MCHC: 37.4 g/dL — ABNORMAL HIGH (ref 31.5–35.7)
MCV: 86 fL (ref 79–97)
MONOCYTES: 9 %
Monocytes Absolute: 0.4 10*3/uL (ref 0.1–0.9)
NEUTROS PCT: 38 %
Neutrophils Absolute: 2 10*3/uL (ref 1.4–7.0)
PLATELETS: 237 10*3/uL (ref 150–379)
RBC: 4.9 x10E6/uL (ref 3.77–5.28)
RDW: 14.2 % (ref 12.3–15.4)
WBC: 5.1 10*3/uL (ref 3.4–10.8)

## 2017-08-15 LAB — MICROALBUMIN / CREATININE URINE RATIO
Creatinine, Urine: 55 mg/dL
Microalb/Creat Ratio: 87.1 mg/g creat — ABNORMAL HIGH (ref 0.0–30.0)
Microalbumin, Urine: 47.9 ug/mL

## 2017-08-15 LAB — LIPID PANEL
Chol/HDL Ratio: 74.3 ratio — ABNORMAL HIGH (ref 0.0–4.4)
Cholesterol, Total: 669 mg/dL (ref 100–199)
HDL: 9 mg/dL — AB (ref >39)
LDL Calculated: 586 mg/dL — ABNORMAL HIGH (ref 0–99)
TRIGLYCERIDES: 369 mg/dL — AB (ref 0–149)
VLDL CHOLESTEROL CAL: 74 mg/dL — AB (ref 5–40)

## 2017-08-15 LAB — CYTOLOGY - PAP
BACTERIAL VAGINITIS: NEGATIVE
CANDIDA VAGINITIS: NEGATIVE
CHLAMYDIA, DNA PROBE: NEGATIVE
Diagnosis: NEGATIVE
NEISSERIA GONORRHEA: NEGATIVE
TRICH (WINDOWPATH): NEGATIVE

## 2017-08-15 LAB — HEPATITIS C ANTIBODY (REFLEX): HCV Ab: <0.1 s/co ratio (ref 0.0–0.9)

## 2017-08-15 LAB — HCV COMMENT:

## 2017-08-15 MED ORDER — EZETIMIBE 10 MG PO TABS: 10.0000 mg | ORAL_TABLET | Freq: Every day | ORAL | 3 refills | Status: DC

## 2017-08-15 MED ORDER — ATORVASTATIN CALCIUM 40 MG PO TABS: 40.0000 mg | ORAL_TABLET | Freq: Every day | ORAL | 3 refills | Status: DC

## 2017-08-19 ENCOUNTER — Telehealth (INDEPENDENT_AMBULATORY_CARE_PROVIDER_SITE_OTHER): Payer: Self-pay

## 2017-08-19 NOTE — Telephone Encounter (Signed)
-----   Message from Loletta Specteroger David Gomez, PA-C sent at 08/18/2017  1:39 PM EST ----- PAP completely negative.

## 2017-08-19 NOTE — Telephone Encounter (Signed)
Patient made aware of negative pap results using pacific interpreter [phone]). Maryjean Mornempestt S Roberts, CMA

## 2017-09-02 NOTE — Progress Notes (Signed)
Patient was seen in office on 08/19/17 for PAP and lipid was not rechecked at that time. Patient will follow up with PCP and blood work

## 2017-09-03 ENCOUNTER — Ambulatory Visit (HOSPITAL_COMMUNITY): Payer: Self-pay

## 2017-09-22 ENCOUNTER — Ambulatory Visit (INDEPENDENT_AMBULATORY_CARE_PROVIDER_SITE_OTHER): Payer: Self-pay | Admitting: Physician Assistant

## 2017-09-22 ENCOUNTER — Encounter (INDEPENDENT_AMBULATORY_CARE_PROVIDER_SITE_OTHER): Payer: Self-pay | Admitting: Physician Assistant

## 2017-09-22 VITALS — BP 121/83 | HR 78 | Temp 97.8°F | Ht <= 58 in | Wt 182.8 lb

## 2017-09-22 DIAGNOSIS — M542 Cervicalgia: Secondary | ICD-10-CM

## 2017-09-22 DIAGNOSIS — Z23 Encounter for immunization: Secondary | ICD-10-CM

## 2017-09-22 DIAGNOSIS — L309 Dermatitis, unspecified: Secondary | ICD-10-CM

## 2017-09-22 DIAGNOSIS — E7841 Elevated Lipoprotein(a): Secondary | ICD-10-CM

## 2017-09-22 DIAGNOSIS — I10 Essential (primary) hypertension: Secondary | ICD-10-CM

## 2017-09-22 DIAGNOSIS — Z794 Long term (current) use of insulin: Secondary | ICD-10-CM

## 2017-09-22 DIAGNOSIS — E119 Type 2 diabetes mellitus without complications: Secondary | ICD-10-CM

## 2017-09-22 MED ORDER — EZETIMIBE 10 MG PO TABS: 10.0000 mg | ORAL_TABLET | Freq: Every day | ORAL | 3 refills | Status: DC

## 2017-09-22 MED ORDER — TRIAMCINOLONE ACETONIDE 0.5 % EX OINT: 1.0000 "application " | TOPICAL_OINTMENT | Freq: Two times a day (BID) | CUTANEOUS | 0 refills | Status: DC

## 2017-09-22 MED ORDER — ATORVASTATIN CALCIUM 40 MG PO TABS: 40.0000 mg | ORAL_TABLET | Freq: Every day | ORAL | 3 refills | Status: DC

## 2017-09-22 MED ORDER — CETAPHIL MOISTURIZING EX CREA: TOPICAL_CREAM | CUTANEOUS | 0 refills | Status: DC | PRN

## 2017-09-22 MED ORDER — NAPROXEN 500 MG PO TABS: 500.0000 mg | ORAL_TABLET | Freq: Two times a day (BID) | ORAL | 0 refills | Status: DC

## 2017-09-22 MED ORDER — INSULIN GLARGINE 100 UNIT/ML ~~LOC~~ SOLN
35.0000 [IU] | Freq: Every day | SUBCUTANEOUS | 11 refills | Status: DC
Start: 2017-09-22 — End: 2017-11-13

## 2017-09-22 MED FILL — EZETIMIBE 10 MG TABS: 10 | 30 days supply | Qty: 30 | Fill #0

## 2017-09-22 MED FILL — TRIAMCINOLONE 0.5% OINTMENT: 0.5 | 15 days supply | Qty: 30 | Fill #0

## 2017-09-22 MED FILL — NAPROXEN 500 MG TABLET: 500 | 15 days supply | Qty: 30 | Fill #0

## 2017-09-22 NOTE — Progress Notes (Signed)
Subjective:  Patient ID: Denise Mosley, female    DOB: 03-29-1961  Age: 57 y.o. MRN: [id number]  CC; DM  HPI  Denise Mosley is a 57 y.o. female with a medical history of DM2 and HTN presents for a f/u of DM2 and tinea cruris. Her A1c was 13.1% approximately 5 weeks ago. She was started on Lantus at 25 units qhs. She had been advised to continue on Metformin 1000 mg and Glimepiride 4 mg. Checks blood sugar regularly. Blood glucose low 100 and high 250. Has some visual blurring. Referred to ophthalmologist but has not been yet due to financial reasons. Does not endorse polydipsia, polyuria, polyphagia, fatigue, or tingling/numbness. Says she is exercising by walking and going up and down stairs. She is trying to avoid carbohydrates, e.g., eating wheat bread. Patient has also been found to have extremely elevated LDL and elevated triglycerides. Atorvastatin was sent to her pharmacy but patient did not know she had medication sent to her pharmacy.   Currently complains of back pain and sternal pain since approximately 6 weeks. Had friends apply a tight wrap around torso and took Aleve with complete resolution of pain. Feels mild pain in the cervical spine of neck. Does not endorse weakness, paralysis, or paresthesias. Also complains of small, nonpruritic, flesh colored bumps on the back of neck and a small patch of similar bumps on the right forearm.    Outpatient Medications Prior to Visit  Medication Sig Dispense Refill  . acetaminophen (TYLENOL) 500 MG tablet Take 1,000 mg by mouth every 6 (six) hours as needed for mild pain.     Marland Kitchen atorvastatin (LIPITOR) 40 MG tablet Take 1 tablet (40 mg total) by mouth daily. 90 tablet 3  . blood glucose meter kit and supplies Dispense based on patient and insurance preference. Use up to four times daily as directed. (E11.8). 1 each 0  . ezetimibe (ZETIA) 10 MG tablet Take 1 tablet (10 mg total) by mouth daily. 90 tablet 3  . fluconazole (DIFLUCAN) 150 MG  tablet Take 1 tablet (150 mg total) by mouth once a week. 3 tablet 0  . glimepiride (AMARYL) 4 MG tablet Take 1 tablet (4 mg total) by mouth daily before breakfast. 30 tablet 3  . insulin glargine (LANTUS) 100 UNIT/ML injection Inject 0.25 mLs (25 Units total) into the skin at bedtime. 10 mL 11  . Insulin Syringes, Disposable, U-100 1 ML MISC 1 each by Does not apply route daily. 90 each 3  . metFORMIN (GLUCOPHAGE) 500 MG tablet Take 2 tablets (1,000 mg total) by mouth 2 (two) times daily with a meal. 120 tablet 11  . naproxen (NAPROSYN) 500 MG tablet Take 1 tablet (500 mg total) 2 (two) times daily with a meal by mouth. 30 tablet 0  . predniSONE (DELTASONE) 20 MG tablet Take 2 tablets (40 mg total) by mouth daily with breakfast. 10 tablet 0   No facility-administered medications prior to visit.      ROS Review of Systems  Constitutional: Negative for chills, fever and malaise/fatigue.  Eyes: Negative for blurred vision.  Respiratory: Negative for shortness of breath.   Cardiovascular: Negative for chest pain and palpitations.  Gastrointestinal: Negative for abdominal pain and nausea.  Genitourinary: Negative for dysuria and hematuria.  Musculoskeletal: Positive for neck pain. Negative for joint pain and myalgias.  Skin: Positive for rash.  Neurological: Negative for tingling and headaches.  Psychiatric/Behavioral: Negative for depression. The patient is not nervous/anxious.     Objective:  BP  121/83 (BP Location: Left Arm, Patient Position: Sitting, Cuff Size: Large)   Pulse 78   Temp 97.8 F (36.6 C) (Oral)   Ht 4' 7.51" (1.41 m)   Wt 182 lb 12.8 oz (82.9 kg)   SpO2 93%   BMI 41.71 kg/m   BP/Weight 09/22/2017 08/14/2017 50/53/9767  Systolic BP 341 937 902  Diastolic BP 83 74 84  Wt. (Lbs) 182.8 186.8 189.2  BMI 41.71 41.14 36.95      Physical Exam  Constitutional: She is oriented to person, place, and time.  Well developed, obese, NAD, polite  HENT:  Head:  Normocephalic and atraumatic.  Eyes: Conjunctivae are normal. No scleral icterus.  Neck: Normal range of motion. Neck supple. No thyromegaly present.  Cardiovascular: Normal rate, regular rhythm and normal heart sounds.  Pulmonary/Chest: Effort normal and breath sounds normal.  Abdominal: Soft. There is no tenderness.  Musculoskeletal: She exhibits no edema.  Lymphadenopathy:    She has no cervical adenopathy.  Neurological: She is alert and oriented to person, place, and time. No cranial nerve deficit. Coordination normal.  Skin: Skin is warm and dry. No rash noted. No erythema. No pallor.  Small, flesh colored papules on the back of neck bilaterally and on the right forearm. No scaling, bleeding, suppuration, or crusting.  Psychiatric: She has a normal mood and affect. Her behavior is normal. Thought content normal.  Vitals reviewed.    Assessment & Plan:     1. Hypertension, unspecified type - Well controlled without anti-hypertensives.   2. Elevated lipoprotein(a) - atorvastatin (LIPITOR) 40 MG tablet; Take 1 tablet (40 mg total) by mouth daily.  Dispense: 90 tablet; Refill: 3 - Begin Ezetimibe 10 mg one tab po qday x90 days 3 refills - Lipid panel; Future  3. Type 2 diabetes mellitus without complication, with long-term current use of insulin (HCC) - insulin glargine (LANTUS) 100 UNIT/ML injection; Inject 0.35 mLs (35 Units total) into the skin at bedtime.  Dispense: 10 mL; Refill: 11  4. Eczema, unspecified type - triamcinolone ointment (KENALOG) 0.5 %; Apply 1 application topically 2 (two) times daily.  Dispense: 30 g; Refill: 0 - Emollient (CETAPHIL) cream; Apply topically as needed.  Dispense: 90 g; Refill: 0  5. Cervicalgia - naproxen (NAPROSYN) 500 MG tablet; Take 1 tablet (500 mg total) by mouth 2 (two) times daily with a meal.  Dispense: 30 tablet; Refill: 0  6. Need for Tdap vaccination - Tdap vaccine greater than or equal to 7yo IM  7. Need for prophylactic  vaccination against Streptococcus pneumoniae (pneumococcus) - Pneumococcal conjugate vaccine 13-valent   Meds ordered this encounter  Medications  . insulin glargine (LANTUS) 100 UNIT/ML injection    Sig: Inject 0.35 mLs (35 Units total) into the skin at bedtime.    Dispense:  10 mL    Refill:  11    Order Specific Question:   Supervising Provider    Answer:   Tresa Garter [id number]  . naproxen (NAPROSYN) 500 MG tablet    Sig: Take 1 tablet (500 mg total) by mouth 2 (two) times daily with a meal.    Dispense:  30 tablet    Refill:  0    Order Specific Question:   Supervising Provider    Answer:   Tresa Garter [id number]  . triamcinolone ointment (KENALOG) 0.5 %    Sig: Apply 1 application topically 2 (two) times daily.    Dispense:  30 g    Refill:  0  Order Specific Question:   Supervising Provider    Answer:   Tresa Garter [id number]  . atorvastatin (LIPITOR) 40 MG tablet    Sig: Take 1 tablet (40 mg total) by mouth daily.    Dispense:  90 tablet    Refill:  3    Order Specific Question:   Supervising Provider    Answer:   Tresa Garter [id number]  . Emollient (CETAPHIL) cream    Sig: Apply topically as needed.    Dispense:  90 g    Refill:  0    Order Specific Question:   Supervising Provider    Answer:   Tresa Garter [[id number]]    Follow-up: Return in about 2 months (around 11/20/2017) for DM2 A1c.   Clent Demark PA

## 2017-09-22 NOTE — Patient Instructions (Signed)
Colesterol  Cholesterol  El colesterol es una sustancia blanca, cerosa, similar a la grasa, que el cuerpo necesita en pequeñas cantidades. El hígado fabrica todo el colesterol que el cuerpo necesita. La sangre transporta el colesterol desde el hígado a través de los vasos sanguíneos. Los depósitos de colesterol (placas) podrían acumularse en las paredes de los vasos sanguíneos (arterias). Las placas provocan el estrechamiento y la rigidez de las arterias. Las placas de colesterol aumentan el riesgo de infarto de miocardio y accidente cerebrovascular.  Aunque sea muy elevado, la concentración de colesterol no puede percibirse. La única forma de saber que tiene colesterol alto es mediante un análisis de sangre. Una vez que se conocen las concentraciones de colesterol, se debe llevar un registro de los resultados de los análisis. Trabaje con el médico para mantener las concentraciones en el rango deseado.  ¿Qué significan los resultados?  · El colesterol total es una medida general de todo el colesterol en sangre.  · El colesterol LDL (lipoproteínas de baja densidad) es el colesterol “malo”. Este tipo es el que hace que se acumulen placas en las paredes de las arterias. Su concentración debe ser baja.  · El colesterol HDL (lipoproteínas de alta densidad) es el colesterol “bueno”, ya que limpia las arterias y arrastra el LDL. Su concentración debe ser alta.  · Los triglicéridos son grasas que el cuerpo puede quemar ya sea como fuente de energía o para almacenar. Las concentraciones altas están estrechamente vinculadas con las enfermedades cardíacas.  ¿Cuáles son las concentraciones de colesterol deseadas?  · El colesterol total debe estar por debajo de 200.  · Para las personas con riesgo, lo aconsejable es mantener el nivel de LDL por debajo de 100; y, para las personas con alto riesgo, es por debajo de 70.  · Se aconseja mantener un nivel de HDL es por encima de 40. Se considera  que un nivel de 60 o superior protege contra las enfermedades cardíacas.  · Los triglicéridos por debajo de 150.  ¿Cómo puedo bajar el colesterol?  Dieta  Siga el programa de alimentación que el médico le indique.  · Elija el pescado o la carne blanca de pollo y pavo, asados u horneados. Limite los cortes grasos de carne roja, los alimentos fritos y las carnes procesadas, como las salchichas y los embutidos.  · Coma gran cantidad de frutas y verduras frescas.  · Elija los cereales integrales, los frijoles, las pastas, las papas y los cereales.  · Elija aceite de oliva, aceite de maíz o aceite de canola, y solo use poca cantidad.  · No coma mantequilla, mayonesa, margarina o aceites de palmiste.  · Evite los alimentos que contengan grasas trans.  · Tome leche descremada o sin grasa y coma yogur y quesos descremados o sin grasa. Evite la leche entera, la crema, los helados, las yemas de huevo y los quesos enteros.  · Los postres sanos incluyen la torta ángel, los bocadillos de jengibre, las galletas con forma de animales, los caramelos duros, los helados de agua y el yogur helado descremado o semidescremado. Evite las masas, tortas, pasteles y galletas.    La práctica de actividad física.  · Siga el programa de ejercicios que le haya indicado el médico. Programa regular:  ? Ayuda a bajar el colesterol LDL y a aumentar el HDL.  ? Ayuda a controlar el peso.  · Haga cosas que aumenten el nivel de actividad; por ejemplo, haga los trabajos de jardinería, salga a caminar o use las   escaleras.  · Pregunte al médico sobre formas de aumentar la actividad en la vida diaria.    Medicamentos  · Tome los medicamentos de venta libre y los recetados solamente como se lo haya indicado el médico.  ? El médico puede recetarle medicamentos para ayudar a bajar el colesterol y reducir el riesgo de enfermedades cardíacas. Por lo general, esto se hace si con la dieta y la actividad física no se logra disminuir el nivel de colesterol.   ? Si tiene varios factores de riesgo, tal vez tenga que tomar medicamentos, incluso si las concentraciones son normales.    Esta información no tiene como fin reemplazar el consejo del médico. Asegúrese de hacerle al médico cualquier pregunta que tenga.  Document Released: 06/12/2005 Document Revised: 12/02/2016 Document Reviewed: 03/02/2016  Elsevier Interactive Patient Education © 2018 Elsevier Inc.

## 2017-09-23 MED FILL — ?ATORVASTATIN 40MG TABLET: 40 | 30 days supply | Qty: 30 | Fill #0

## 2017-11-13 ENCOUNTER — Ambulatory Visit (INDEPENDENT_AMBULATORY_CARE_PROVIDER_SITE_OTHER): Payer: Self-pay | Admitting: Physician Assistant

## 2017-11-13 ENCOUNTER — Encounter (INDEPENDENT_AMBULATORY_CARE_PROVIDER_SITE_OTHER): Payer: Self-pay | Admitting: Physician Assistant

## 2017-11-13 VITALS — BP 106/65 | HR 67 | Temp 98.0°F | Resp 18 | Ht <= 58 in | Wt 181.0 lb

## 2017-11-13 DIAGNOSIS — Z91199 Patient's noncompliance with other medical treatment and regimen due to unspecified reason: Secondary | ICD-10-CM

## 2017-11-13 DIAGNOSIS — IMO0002 Reserved for concepts with insufficient information to code with codable children: Secondary | ICD-10-CM

## 2017-11-13 DIAGNOSIS — Z23 Encounter for immunization: Secondary | ICD-10-CM

## 2017-11-13 DIAGNOSIS — E118 Type 2 diabetes mellitus with unspecified complications: Secondary | ICD-10-CM

## 2017-11-13 DIAGNOSIS — Z9119 Patient's noncompliance with other medical treatment and regimen: Secondary | ICD-10-CM

## 2017-11-13 DIAGNOSIS — E1165 Type 2 diabetes mellitus with hyperglycemia: Secondary | ICD-10-CM

## 2017-11-13 LAB — GLUCOSE, POCT (MANUAL RESULT ENTRY): POC GLUCOSE: 324 mg/dL — AB (ref 70–99)

## 2017-11-13 LAB — POCT GLYCOSYLATED HEMOGLOBIN (HGB A1C): Hemoglobin A1C: 12.2

## 2017-11-13 MED ORDER — INSULIN GLARGINE 100 UNIT/ML ~~LOC~~ SOLN: 60.0000 [IU] | Freq: Every day | SUBCUTANEOUS | 11 refills | Status: DC

## 2017-11-13 NOTE — Patient Instructions (Signed)
Community Resources  Advocacy/Legal Legal Aid [place]:  1-866-219-5262  /  336-272-0148  Family Justice Center:  336-641-7233  [facility] [place] 24-hr Crisis line:  336-273-7273  Women's Resource Center, [place]:  336-275-6090  Court Watch (custody):  336-275-2346  [place] [facility]:   336-279-9299    Baby & Breastfeeding Car Seat Inspection @ Various [place] Fire Depts.- call 336-373-2177  [facility]  336-832-6860  [facility] 336-878-6712  WIC: 336-641-3663 ([place]);  336-641-7571 ([place])  La Leche League:  1-877-452-5321   Childcare [place] [facility]: 336-369-5097 ([place]) / 336-887-8224 ([place])  - Child Care Resources/ Referrals/ Scholarships  - Head Start/ Early Head Start (call or apply online)  [place] DHHS: [place] Pre-K :  1-800-859-0829 / 336-274-5437   Employment / Job Search [facility] [place]: 336-275-6090 / [address]  [place] [facility] [employer] [facility] (JobLink): 336-373-5922 ([place]) / 336-882-4141 ([place])  [facility] Resource/ Career Center: 336-275-9801 / 336-282-7307  [facility] Job & Career Center: 336-373-3764  DHHS Work First: 336-641-3447 ([place]) / 336-641-3447 ([place])  [facility] [place]:  336-676-5871   Financial Assistance [facility]:  336-553-2657  [employer]: 336-235-0368  [employer] [facility] (furniture):  336-370-4002  [place] [facility]: 336-373-4264  Low Income Energy Assistance  336-641-3000   Food Assistance DHHS- SNAP/ Food Stamps: 336-641-4588  WIC: [place]- 336-641-3663 ;  [place] 336-641-7571  Little Green Book- Free Meals  Little Blue Book- Free Food Pantries  During the summer, text "FOOD" to 877877   General Health / Clinics (Adults) Orange Card (for Adults) through [facility]: (336) 895-4900  [facility]:   336-832-8035  [facility]:   336-832-4444  Health Department:  336-641-3245  [facility]:  336-415-3877 / 336-641-2100  [facility] [place]:   336-373-0678  [facility]:   336-334-4822 x 50251   Housing [place] [facility] Coalition:   336-691-9521  [place] Housing Authority:  336-275-8501  Affordable Housing Managemnt:  336-273-0568   Immigrant/ Refugee Center for [place]ians (UNCG):  336-256-1065  Fa[facility]:  336-379-0037  [facility] [employer] [facility]:  336-937-4701  [employer] World Services:  336-617-0381  African Services Coalition:  336-574-2677   LGBTQ YouthSAFE  www.youthsafe[place].org  PFLAG  336-541-6754 / info@[place].org  The Trevor Project:  1-866-488-7386   Mental Health/ Substance Use [facility]  336-387-6161  [facility]:  336-832-9700 or 1-800-711-2635  [facility] of Care:  336-271-5888  [facility]:  336-294-1349  [facility]:  336-542-2884  [facility] (walk-ins)  336-676-6840 / [address]  [place]:  800-449-1287  Alcoholics Anonymous:  336-854-4278  Narcotics Anonymous:  800-365-1036  Quit Smoking Hotline:  800-QUIT-NOW (800-784-8669)   Parenting Children's Home Society:  800-632-1400  [place]: Education Center & Support Groups:  336-832-6682  YWCA: 336-273-3461  [employer]CG: Bringing Out the Best:  336-334-3120               Thriving at Three (Hispanic families): 336-256-1066  Healthy Start ([facility]):  336-387-6161 x2288  Parents as Teachers:  336-691-0024  [place] [facility]- Learning Together (Immigrants): 336-369-5001   Poison Control 800-222-1222  Sports & Recreation [employer] Open Doors Application: ymcanwnc.org/join/open-doors-financial-assistance/  City of [place] Recreation Centers: http://www.[place].gov/index.aspx?page=3615   Special Needs Family Support Network:  336-832-6507  Autism Society of :   336-333-0197 x1402 or x1412 /  800-785-1035  [facility] [place]:  336-334-5773     ARC of :  [phone]  Children's Developmental Service Agency (CDSA):  (586) 121-9457860-792-8988  [facility] (Care Coordination for Children):  [phone]   Transportation Medicaid Transportation: [phone] to apply  [place] Authority: 5097943068707-843-5886 (reduced-fare bus ID to Medicaid/ Medicare/ Orange Card)  SCAT Paratransit services: Eligible riders only, call [phone] for application   Tutoring/Mentoring [facility] Child Development Institute: 364-690-9973  [facility]/ Big Sisters: [phone] [phone]([place])  [phone] ([place])  ACES through child's school: [phone]  [employer] Achievers: contact your local Y  SHIELD Mentor Program: [phone]      Diabetes inspida (Diabetes Insipidus) La diabetes inspida es una enfermedad poco frecuente que hace que el organismo produzca ms orina que lo normal, lo que causa sed y deshidratacin. La [place] se compone mayormente de agua (orina diluida). Hay varios tipos de diabetes inspida. Las formas ms comunes se relacionan con Psychiatric nursela menor produccin o la resistencia a la hormona que regula la produccin de orina (hormona antiduirtica). La diabetes inspida se puede controlar y no suele ser grave. Esta diabetes no es igual que la diabetes mellitus, que es la enfermedad ms comn que aumenta la concentracin de Bankerazcar en la sangre. CAUSAS   Tumores cerebrales.  Ciruga de cerebro.  Traumatismo cerebral.  Factores genticos.  Cncer.  Encefalopata hipxica.  Enfermedad gentica del sistema inmunolgico.  Anorexia nerviosa.  Anomalas y enfermedades vasculares.  Toxicidad por litio.  Altas concentraciones de calcio.  Bajas concentraciones de potasio.  Enfermedades renales.  Embarazo.  Bajo nivel de Bankerazcar en la sangre.  Algunos medicamentos. SIGNOS Y SNTOMAS  Miccin excesiva El trmino miccin excesiva significa orinar 10.4 a 12.5tazas (2.5 a 3l) en el trmino de 24horas.  Sed excesiva.  Miccin nocturna  frecuente.  Vmitos.  Diarrea. DIAGNSTICO  La diabetes inspida puede diagnosticarse con:  Un examen fsico.  Anlisis de sangre.  Anlisis de Comorosorina. TRATAMIENTO  Una vez identificado el tipo especfico de diabetes inspida, el tratamiento puede incluir uno o ms de los siguientes:  Aumento del consumo de [place].  Medicamentos.  Cambios en la dieta. Tal vez le indiquen una dieta con bajo contenido de protenas o de sodio. Es posible que Programmer, multimediadeba consultar regularmente al mdico. INSTRUCCIONES PARA EL CUIDADO EN EL HOGAR  Beba agua y lquidos como se lo haya indicado el mdico.  Siga las indicaciones del mdico con respecto a la dieta, si es que las hubo.  Tome todos los medicamentos como le indic el mdico.  Si el mdico se lo indic, controle el riesgo de deshidratacin cuando el calor es extremo.  Cumpla con todas las visitas de control, segn le indique su mdico.  Si los resultados de los estudios no estn listos durante la visita, programe una cita con su mdico para conocerlos. No suponga que los resultados son normales si no tiene noticias del mdico o del centro mdico. SOLICITE ATENCIN MDICA SI: Los sntomas continan, incluso despus del tratamiento. Esta informacin no tiene Theme park managercomo fin reemplazar el consejo del mdico. Asegrese de hacerle al mdico cualquier pregunta que tenga. Document Released: 09/07/2013 Elsevier Interactive Patient Education  2017 [employer] Inc.

## 2017-11-13 NOTE — Progress Notes (Signed)
Subjective:  Patient ID: Denise Mosley, female    DOB: 05/16/1961  Age: 57 y.o. MRN: [id number]  CC: diabetes  HPI  Denise Medina-Gomezis a 57 y.o.femalewith a medical history of DM2 and HTN presents for a f/u of DM2. Her A1c was 13.1% approximately 12 weeks ago. Lantus was increased from 25 units qhs to 35 units qhs Patient doing 30 units qhs. A1c 12.2%. Generally feeling better in regards to tingling of extremities. Pt says she is not adhering to a low carb diet due to low income. Does not regularly exercise. Has been sleeping at her neighbors house because the light was shut off due to not paying the bills. Says she is dependent on her son for financial support but his girlfriend has "taken everything he has". Does not endorse any other symptoms or complaints.     Outpatient Medications Prior to Visit  Medication Sig Dispense Refill  . acetaminophen (TYLENOL) 500 MG tablet Take 1,000 mg by mouth every 6 (six) hours as needed for mild pain.     Marland Kitchen atorvastatin (LIPITOR) 40 MG tablet Take 1 tablet (40 mg total) by mouth daily. 90 tablet 3  . blood glucose meter kit and supplies Dispense based on patient and insurance preference. Use up to four times daily as directed. (E11.8). 1 each 0  . Emollient (CETAPHIL) cream Apply topically as needed. 90 g 0  . ezetimibe (ZETIA) 10 MG tablet Take 1 tablet (10 mg total) by mouth daily. 90 tablet 3  . glimepiride (AMARYL) 4 MG tablet Take 1 tablet (4 mg total) by mouth daily before breakfast. 30 tablet 3  . insulin glargine (LANTUS) 100 UNIT/ML injection Inject 0.35 mLs (35 Units total) into the skin at bedtime. 10 mL 11  . Insulin Syringes, Disposable, U-100 1 ML MISC 1 each by Does not apply route daily. 90 each 3  . metFORMIN (GLUCOPHAGE) 500 MG tablet Take 2 tablets (1,000 mg total) by mouth 2 (two) times daily with a meal. 120 tablet 11  . naproxen (NAPROSYN) 500 MG tablet Take 1 tablet (500 mg total) by mouth 2 (two) times daily with a meal. 30  tablet 0  . triamcinolone ointment (KENALOG) 0.5 % Apply 1 application topically 2 (two) times daily. 30 g 0  . predniSONE (DELTASONE) 20 MG tablet Take 2 tablets (40 mg total) by mouth daily with breakfast. 10 tablet 0  . fluconazole (DIFLUCAN) 150 MG tablet Take 1 tablet (150 mg total) by mouth once a week. 3 tablet 0   No facility-administered medications prior to visit.      ROS Review of Systems  Constitutional: Negative for chills, fever and malaise/fatigue.  Eyes: Negative for blurred vision.  Respiratory: Negative for shortness of breath.   Cardiovascular: Negative for chest pain and palpitations.  Gastrointestinal: Negative for abdominal pain and nausea.  Genitourinary: Negative for dysuria and hematuria.  Musculoskeletal: Negative for joint pain and myalgias.  Skin: Negative for rash.  Neurological: Negative for tingling and headaches.  Psychiatric/Behavioral: Negative for depression. The patient is not nervous/anxious.     Objective:  BP 106/65 (BP Location: Left Arm, Patient Position: Sitting, Cuff Size: Large)   Pulse 67   Temp 98 F (36.7 C) (Oral)   Resp 18   Ht 4' 7.91" (1.42 m)   Wt 181 lb (82.1 kg)   SpO2 95%   BMI 40.72 kg/m   BP/Weight 11/13/2017 09/22/2017 37/16/9678  Systolic BP 938 101 751  Diastolic BP 65 83 74  Wt. (  Lbs) 181 182.8 186.8  BMI 40.72 41.71 41.14      Physical Exam  Constitutional: She is oriented to person, place, and time.  Well developed, obese, NAD, polite  HENT:  Head: Normocephalic and atraumatic.  Eyes: Conjunctivae are normal. No scleral icterus.  Neck: Normal range of motion. Neck supple. No thyromegaly present.  Cardiovascular: Normal rate, regular rhythm and normal heart sounds.  Pulmonary/Chest: Effort normal and breath sounds normal.  Musculoskeletal: She exhibits no edema.  Neurological: She is alert and oriented to person, place, and time. No cranial nerve deficit. Coordination normal.  Skin: Skin is warm and  dry. No rash noted. No erythema. No pallor.  Psychiatric: She has a normal mood and affect. Her behavior is normal. Thought content normal.  Vitals reviewed.    Assessment & Plan:   1. Uncontrolled type 2 diabetes mellitus with complication (HCC) - HgB A1c 12.2% down from 13.1% three months ago.  - Glucose (CBG) - Increase insulin glargine (LANTUS) 100 UNIT/ML injection; Inject 0.6 mLs (60 Units total) into the skin at bedtime.  Dispense: 10 mL; Refill: 11. Patient advised to increase   2. Need for prophylactic vaccination and inoculation against influenza - Flu Vaccine QUAD 6+ mos PF IM (Fluarix Quad PF)  3. Noncompliance - partly secondary to financial situation. I have printed out community resources for patient and also sent a message to Ms. Christa See LCSW to reach out to patient.    Meds ordered this encounter  Medications  . insulin glargine (LANTUS) 100 UNIT/ML injection    Sig: Inject 0.6 mLs (60 Units total) into the skin at bedtime.    Dispense:  10 mL    Refill:  11    Order Specific Question:   Supervising Provider    Answer:   Tresa Garter [[id number]]    Follow-up: Return in about 3 months (around 02/10/2018) for diabetes.   Clent Demark PA

## 2017-11-21 ENCOUNTER — Telehealth: Payer: Self-pay | Admitting: Licensed Clinical Social Worker

## 2017-11-21 NOTE — Telephone Encounter (Signed)
LCSWA utilized [employer] Lurena Joiner(Rebecca [phone]#[id number]) to contact pt. Pt did not answer and there was not a voicemail set up

## 2017-11-24 ENCOUNTER — Encounter: Payer: Self-pay | Admitting: Licensed Clinical Social Worker

## 2017-11-24 ENCOUNTER — Telehealth: Payer: Self-pay | Admitting: Licensed Clinical Social Worker

## 2017-11-24 NOTE — Telephone Encounter (Signed)
LCSWA utilized [employer] Byrd Hesselbach(Maria [phone]#[id number]) to contact pt. Interpretor stated that she called twice; however, no answer pt did not answer. Voicemail is not set up to leave a message.

## 2017-12-29 MED FILL — metFORMIN HCL 500 MG TABS: 500 | 30 days supply | Qty: 120 | Fill #1

## 2017-12-29 MED FILL — !LANTUS 100 UNITS/ML VIAL: 100 | 28 days supply | Qty: 10 | Fill #1

## 2018-02-11 ENCOUNTER — Encounter (INDEPENDENT_AMBULATORY_CARE_PROVIDER_SITE_OTHER): Payer: Self-pay | Admitting: Nurse Practitioner

## 2018-02-11 ENCOUNTER — Ambulatory Visit (INDEPENDENT_AMBULATORY_CARE_PROVIDER_SITE_OTHER): Payer: Self-pay | Admitting: Nurse Practitioner

## 2018-02-11 ENCOUNTER — Other Ambulatory Visit: Payer: Self-pay

## 2018-02-11 ENCOUNTER — Emergency Department (HOSPITAL_COMMUNITY)
Admission: EM | Admit: 2018-02-11 | Discharge: 2018-02-11 | Disposition: A | Discharge status: Home / Self Care | Payer: Self-pay | Attending: Emergency Medicine | Admitting: Emergency Medicine

## 2018-02-11 ENCOUNTER — Ambulatory Visit (INDEPENDENT_AMBULATORY_CARE_PROVIDER_SITE_OTHER): Payer: Self-pay | Admitting: Physician Assistant

## 2018-02-11 ENCOUNTER — Encounter (HOSPITAL_COMMUNITY): Payer: Self-pay | Admitting: Emergency Medicine

## 2018-02-11 VITALS — BP 130/81 | HR 74 | Temp 98.1°F | Ht <= 58 in | Wt 188.2 lb

## 2018-02-11 DIAGNOSIS — R29810 Facial weakness: Secondary | ICD-10-CM

## 2018-02-11 DIAGNOSIS — Z87891 Personal history of nicotine dependence: Secondary | ICD-10-CM | POA: Insufficient documentation

## 2018-02-11 DIAGNOSIS — I10 Essential (primary) hypertension: Secondary | ICD-10-CM | POA: Insufficient documentation

## 2018-02-11 DIAGNOSIS — E119 Type 2 diabetes mellitus without complications: Secondary | ICD-10-CM | POA: Insufficient documentation

## 2018-02-11 DIAGNOSIS — IMO0002 Reserved for concepts with insufficient information to code with codable children: Secondary | ICD-10-CM

## 2018-02-11 DIAGNOSIS — Z79899 Other long term (current) drug therapy: Secondary | ICD-10-CM | POA: Insufficient documentation

## 2018-02-11 DIAGNOSIS — E7841 Elevated Lipoprotein(a): Secondary | ICD-10-CM

## 2018-02-11 DIAGNOSIS — Z7984 Long term (current) use of oral hypoglycemic drugs: Secondary | ICD-10-CM | POA: Insufficient documentation

## 2018-02-11 DIAGNOSIS — E118 Type 2 diabetes mellitus with unspecified complications: Secondary | ICD-10-CM

## 2018-02-11 DIAGNOSIS — E1165 Type 2 diabetes mellitus with hyperglycemia: Secondary | ICD-10-CM

## 2018-02-11 DIAGNOSIS — B309 Viral conjunctivitis, unspecified: Secondary | ICD-10-CM | POA: Insufficient documentation

## 2018-02-11 LAB — POCT GLYCOSYLATED HEMOGLOBIN (HGB A1C): HEMOGLOBIN A1C: 11.3 % — AB (ref 4.0–5.6)

## 2018-02-11 MED ORDER — INSULIN GLARGINE 100 UNIT/ML SOLOSTAR PEN: 60.0000 [IU] | PEN_INJECTOR | Freq: Every day | SUBCUTANEOUS | 11 refills | Status: DC

## 2018-02-11 MED ORDER — ATORVASTATIN CALCIUM 40 MG PO TABS: 40.0000 mg | ORAL_TABLET | Freq: Every day | ORAL | 3 refills | Status: DC

## 2018-02-11 MED ORDER — INSULIN GLARGINE 100 UNIT/ML SOLOSTAR PEN: 20.0000 [IU] | PEN_INJECTOR | Freq: Every day | SUBCUTANEOUS | 11 refills | Status: DC

## 2018-02-11 MED ORDER — TETRAHYDROZOLINE-ZN SULFATE 0.05-0.25 % OP SOLN: 2.0000 [drp] | Freq: Three times a day (TID) | OPHTHALMIC | 0 refills | Status: DC | PRN

## 2018-02-11 MED ORDER — EZETIMIBE 10 MG PO TABS: 10.0000 mg | ORAL_TABLET | Freq: Every day | ORAL | 3 refills | Status: DC

## 2018-02-11 MED ORDER — GLIMEPIRIDE 4 MG PO TABS: 4.0000 mg | ORAL_TABLET | Freq: Every day | ORAL | 3 refills | Status: DC

## 2018-02-11 MED ORDER — SITAGLIPTIN PHOS-METFORMIN HCL 50-1000 MG PO TABS: 1.0000 | ORAL_TABLET | Freq: Two times a day (BID) | ORAL | 3 refills | Status: DC

## 2018-02-11 MED ORDER — TETRACAINE HCL 0.5 % OP SOLN
2.0000 [drp] | Freq: Once | OPHTHALMIC | Status: AC
  Administered 2018-02-11: 2 [drp] via OPHTHALMIC
  Filled 2018-02-11: qty 4

## 2018-02-11 MED ORDER — GLUCOSE BLOOD VI STRP: ORAL_STRIP | 12 refills | Status: DC

## 2018-02-11 MED ORDER — FLUORESCEIN SODIUM 1 MG OP STRP
1.0000 | ORAL_STRIP | Freq: Once | OPHTHALMIC | Status: AC
  Administered 2018-02-11: 1 via OPHTHALMIC
  Filled 2018-02-11: qty 1

## 2018-02-11 MED ORDER — INSULIN PEN NEEDLE 31G X 5 MM MISC: 5 refills | Status: DC

## 2018-02-11 MED ORDER — TRUE METRIX METER W/DEVICE KIT: PACK | 0 refills | Status: AC

## 2018-02-11 MED FILL — JANUMET 50-1,000 MG TABLET: 50-1000 | 30 days supply | Qty: 60 | Fill #0

## 2018-02-11 MED FILL — !LANTUS SOLOSTAR 100UNITS/M: 100 | 30 days supply | Qty: 6 | Fill #0

## 2018-02-11 MED FILL — ATORVASTATIN CALCIUM 40 MG: 40 | 30 days supply | Qty: 30 | Fill #0

## 2018-02-11 MED FILL — EZETIMIBE 10 MG TABS: 10 | 30 days supply | Qty: 30 | Fill #0

## 2018-02-11 MED FILL — GLIMEPIRIDE 4 MG TABS: 4 | 30 days supply | Qty: 30 | Fill #0

## 2018-02-11 NOTE — ED Triage Notes (Signed)
Patient presents to ED for assessment of right eye twitching, redness, itchiness and tearing x 3 days.  Also c/o right neck pain and arm pain.  No neuro deficits noted, no associated symptoms.  States it began after she pulled her grown son off the floor after a fall.

## 2018-02-11 NOTE — ED Provider Notes (Addendum)
[facility] EMERGENCY DEPARTMENT Provider Note  CSN: [id number] Arrival date & time: 02/11/18  1126  History   Chief Complaint Chief Complaint  Patient presents with  . Eye Pain    HPI  A medical interpreter was used for this interview.   Denise Mosley is a 57 y.o. female with a medical history of Type 2 DM and Bell's Palsy who presented to the ED for unilateral eye redness and itchiness x 4 days. Patient denies any precipitating injuries or trauma to the eye. Denies sick contacts.  Denies pain or purulent discharge. She states that her distance vision is a little blurry. Patient states she has allergies and when her allergies are really bad both eyes become red, itchy and teary. She states this is different because only one eye is affected. Associated symptoms: sore throat, rhinorrhea and congestion which she attributes to allergies. Denies eye pain with movement, fever, sinus pain, headache. Denies neuro symptoms, including paresthesias, facial asymmetry, speech changes, slurring or drooping. Patient has tried OTC anti-itch eye drops and chamomile tea drops prior to coming to the ED.  Past Medical History:  Diagnosis Date  . Bell's palsy   . Diabetes mellitus without complication ([place])   . Hyperlipidemia   . Hypertension     Patient Active Problem List   Diagnosis Date Noted  . Flu   . Hyperglycemia   . Uncontrolled type 2 diabetes mellitus with complication ([place])   . Pneumonia of both lungs due to infectious organism   . Acute pulmonary edema (HCC)   . Atelectasis   . Acute respiratory failure with hypoxia ([place]) in setting of BASDZ, CAP 01/19/2017  . Influenza due to identified novel influenza A virus with other respiratory manifestations   . Hyponatremia 01/15/2017  . Dehydration 01/15/2017  . Diabetes mellitus type 2, controlled ([place]) 01/15/2017  . CAP (community acquired pneumonia) 01/14/2017    Past Surgical History:  Procedure Laterality Date    . CESAREAN SECTION    . CHOLECYSTECTOMY       OB History   None      Home Medications    Prior to Admission medications   Medication Sig Start Date End Date Taking? Authorizing Provider  acetaminophen (TYLENOL) 500 MG tablet Take 1,000 mg by mouth every 6 (six) hours as needed for mild pain.     [provider]  atorvastatin (LIPITOR) 40 MG tablet Take 1 tablet (40 mg total) by mouth daily. 02/11/18   Gildardo Pounds, NP  blood glucose meter kit and supplies Dispense based on patient and insurance preference. Use up to four times daily as directed. (E11.8). 01/23/17   Johnson, Clanford L, MD  Blood Glucose Monitoring Suppl (TRUE METRIX METER) w/Device KIT Use as instructed 02/11/18   Gildardo Pounds, NP  ezetimibe (ZETIA) 10 MG tablet Take 1 tablet (10 mg total) by mouth daily. 02/11/18   Gildardo Pounds, NP  glimepiride (AMARYL) 4 MG tablet Take 1 tablet (4 mg total) by mouth daily before breakfast. 02/11/18   Gildardo Pounds, NP  glucose blood (TRUE METRIX BLOOD GLUCOSE TEST) test strip Use as instructed 02/11/18   Gildardo Pounds, NP  Insulin Glargine (LANTUS) 100 UNIT/ML Solostar Pen Inject 60 Units into the skin daily at 10 pm. 02/11/18 03/13/18  Gildardo Pounds, NP  Insulin Pen Needle (B-D UF III MINI PEN NEEDLES) 31G X 5 MM MISC Use as instructed 02/11/18   Gildardo Pounds, NP  naproxen (NAPROSYN) 500  MG tablet Take 1 tablet (500 mg total) by mouth 2 (two) times daily with a meal. Patient not taking: Reported on 02/11/2018 09/22/17   Clent Demark, PA-C  sitaGLIPtin-metformin (JANUMET) 50-1000 MG tablet Take 1 tablet by mouth 2 (two) times daily with a meal. 02/11/18   Gildardo Pounds, NP  tetrahydrozoline-zinc (RELIEF EYE DROPS) 0.05-0.25 % ophthalmic solution Place 2 drops into the right eye 3 (three) times daily as needed. 02/11/18   Mortis, Alvie [place] I, PA-C  triamcinolone ointment (KENALOG) 0.5 % Apply 1 application topically 2 (two) times daily. Patient not taking:  Reported on 02/11/2018 09/22/17   Clent Demark, PA-C    Family History Family History  Problem Relation Age of Onset  . Diabetes Mother   . Diabetes Father     Social History Social History   Tobacco Use  . Smoking status: Former Research scientist (life sciences)  . Smokeless tobacco: Never Used  . Tobacco comment: Quit 4 years ago  Substance Use Topics  . Alcohol use: No  . Drug use: No     Allergies   Patient has no known allergies.   Review of Systems Review of Systems  Constitutional: Negative for fatigue and fever.  HENT: Positive for congestion, facial swelling, rhinorrhea and sore throat. Negative for sinus pressure and sinus pain.        Attributes this to allergies  Eyes: Positive for redness, itching and visual disturbance. Negative for photophobia, pain and discharge.  Respiratory: Negative.   Cardiovascular: Negative.   Skin: Negative.   Allergic/Immunologic: Positive for environmental allergies.  Neurological: Negative for facial asymmetry, speech difficulty, numbness and headaches.     Physical Exam Updated Vital Signs BP 130/85 (BP Location: Right Arm)   Pulse 82   Temp 98.2 F (36.8 C)   Resp 16   SpO2 100%   Physical Exam  Constitutional: Vital signs are normal. She appears well-nourished. She is cooperative. No distress.  Obese. Sitting comfortably in chair.  HENT:  Head: Normocephalic and atraumatic. Head is without right periorbital erythema and without left periorbital erythema.  Right Ear: Hearing, tympanic membrane, external ear and ear canal normal.  Left Ear: Hearing, tympanic membrane, external ear and ear canal normal.  Nose: Nose normal.  Mouth/Throat: Uvula is midline, oropharynx is clear and moist and mucous membranes are normal.  Eyes: Pupils are equal, round, and reactive to light. EOM and lids are normal. Right conjunctiva is injected.  Slit lamp exam:      The right eye shows no corneal abrasion, no corneal ulcer and no foreign body.       The  left eye shows no corneal abrasion, no corneal ulcer and no foreign body.  Right Eye Acuity: 20/40, Left Eye Acuity: 20/30, Both Eyes: 20/25.    Neurological: She is alert.  Nursing note and vitals reviewed.    ED Treatments / Results  Labs (all labs ordered are listed, but only abnormal results are displayed) Labs Reviewed - No data to display  EKG EKG Interpretation  Date/Time:  Wednesday Feb 11 2018 11:34:01 EDT Ventricular Rate:  77 PR Interval:  152 QRS Duration: 86 QT Interval:  392 QTC Calculation: 443 R Axis:   -47 Text Interpretation:  Normal sinus rhythm Left anterior fascicular block Nonspecific ST abnormality Abnormal ECG No significant change since last tracing Confirmed by Orlie Dakin [phone]) on 02/11/2018 12:22:53 PM   Radiology No results found.  Procedures Procedures (including critical care time)  Medications Ordered in ED Medications  tetracaine (PONTOCAINE) 0.5 % ophthalmic solution 2 drop (2 drops Right Eye Given by Other 02/11/18 1314)  fluorescein ophthalmic strip 1 strip (1 strip Right Eye Given by Other 02/11/18 1314)     Initial Impression / Assessment and Plan / ED Course  Triage vital signs and the nursing notes have been reviewed.  Pertinent labs & imaging results that were available during care of the patient were reviewed and considered in medical decision making (see chart for details).    Patient presents in no acute distress. History and clinical presentation consistent with viral conjunctivitis. Eye exam did not reveal any abrasions, foreign bodies or ulcers that require additional evaluation. No neuro symptoms to suggest a primary neuro disorder or recurrent Bell's palsy. No antibiotics indicated at this time. May use OTC and supportive treatment options.   Final Clinical Impressions(s) / ED Diagnoses  1. Viral Conjunctivitis. Education provided. No antibiotics indicated at this time.  Dispo: Home. After thorough clinical  evaluation, this patient is determined to be medically stable and can be safely discharged with the previously mentioned treatment and/or outpatient follow-up/referral(s). At this time, there are no other apparent medical conditions that require further screening, evaluation or treatment.  Final diagnoses:  Viral conjunctivitis of right eye    ED Discharge Orders        Ordered    tetrahydrozoline-zinc (RELIEF EYE DROPS) 0.05-0.25 % ophthalmic solution  3 times daily PRN     02/11/18 1328        Mortis, [place] I, PA-C 02/11/18 1333    Mortis, [place] I, PA-C 02/11/18 1402    Orlie Dakin, MD 02/11/18 2354

## 2018-02-11 NOTE — Progress Notes (Signed)
Assessment & Plan:  Denise Mosley was seen today for follow-up.  Diagnoses and all orders for this visit:  Uncontrolled type 2 diabetes mellitus with complication (HCC) -     HgB A1c -     sitaGLIPtin-metformin (JANUMET) 50-1000 MG tablet; Take 1 tablet by mouth 2 (two) times daily with a meal. -     Insulin Pen Needle (B-D UF III MINI PEN NEEDLES) 31G X 5 MM MISC; Use as instructed -     glimepiride (AMARYL) 4 MG tablet; Take 1 tablet (4 mg total) by mouth daily before breakfast. -     Blood Glucose Monitoring Suppl (TRUE METRIX METER) w/Device KIT; Use as instructed -     glucose blood (TRUE METRIX BLOOD GLUCOSE TEST) test strip; Use as instructed -     Insulin Glargine (LANTUS) 100 UNIT/ML Solostar Pen; Inject 60 Units into the skin daily at 10 pm. Continue blood sugar control as discussed in office today, low carbohydrate diet, and regular physical exercise as tolerated, 150 minutes per week (30 min each day, 5 days per week, or 50 min 3 days per week). Keep blood sugar logs with fasting goal of 80-130 mg/dl, post prandial less than 180.  For Hypoglycemia: BS <60 and Hyperglycemia BS >400; contact the clinic ASAP. Annual eye exams and foot exams are recommended.   Elevated lipoprotein(a) -     atorvastatin (LIPITOR) 40 MG tablet; Take 1 tablet (40 mg total) by mouth daily. -     ezetimibe (ZETIA) 10 MG tablet; Take 1 tablet (10 mg total) by mouth daily. INSTRUCTIONS: Work on a low fat, heart healthy diet and participate in regular aerobic exercise program by working out at least 150 minutes per week. No fried foods. No junk foods, sodas, sugary drinks, unhealthy snacking, alcohol or smoking.     Mouth droop due to facial weakness Patient was instructed to go to the emergency room to be evaluated for stroke vs bells palsy    Patient has been counseled on age-appropriate routine health concerns for screening and prevention. These are reviewed and up-to-date. Referrals have been placed  accordingly. Immunizations are up-to-date or declined.    Subjective:   Chief Complaint  Patient presents with  . Follow-up    DM   HPI Denise Mosley 57 y.o. female presents to office today for follow up to DM Type 2 and HTN. VRI was used to communicate directly with patient for the entire encounter including providing detailed patient instructions.  Following work-up for diabetes today patient instructs me that she has been experiencing right facial numbness and droop for the past 3 days.  She endorses a history of Bell's palsy in the past and review of chart shows an ED visit on 08/21/2016 for Bell's palsy.  She is also complaining of right upper extremity weakness.  She denies any dysphagia, drooling, or difficulty with mastication on the right side aside from poor dentition.  She has been experiencing cold-like symptoms for the past several days as well.  As she also has reported history of stroke and Bell's palsy I have instructed her to go to the emergency room.  She has a family friend who will be transporting her there.  I have also contact the triage nurse (Dream/Dreamy?) at [facility], ED regarding patient's impending arrival.     DM She  presents for  follow-up pf Type 2 Diabetes Mellitus. Disease course has been improving. There are no hypoglycemic symptoms. There are no hypoglycemic complications. Symptoms are  stable. There are no diabetic complications. Risk factors for coronary artery disease include dyslipidemia, diabetes mellitus, obesity,  sedentary lifestyle. Current diabetic treatment includes glimepiride 4 mg daily, Lantus insulin 60 units at night (although patient reports she is only been taking 40 units at night) and metformin 1000 mg twice daily (which will be switched today to Janumet 50-100 mg twice daily). . Patient is compliant with treatment all of the time and monitors blood glucose at home 2 times per day.  Weight has been increasing. Patient has not seen a dietician.  Patient is not compliant with exercise or consistent with dietary modifications.   Home blood glucose trend : (FBS 180-200 mg/dl) (POSTPRANDIAL 160-180)  An ACE inhibitor/angiotensin II receptor blocker is not being taken. She does not see a podiatrist. Eye exam is not current. She can recall back to me that she should not be eating "flour, tortillas, bread, sugar".  Lab Results  Component Value Date   HGBA1C 11.3 (A) 02/11/2018   . Lab Results  Component Value Date   HGBA1C 12.2 11/13/2017    Hyperlipidemia Patient presents for follow up to hyperlipidemia.  She is medication compliant and reports she finished it yesterday. She is not diet compliant and denies lower extremity edema and skin xanthelasma or statin intolerance including myalgias.  Lab Results  Component Value Date   CHOL 669 (HH) 08/14/2017   Lab Results  Component Value Date   HDL 9 (L) 08/14/2017   Lab Results  Component Value Date   LDLCALC 586 (H) 08/14/2017   Lab Results  Component Value Date   TRIG 369 (H) 08/14/2017   Lab Results  Component Value Date   CHOLHDL 74.3 (H) 08/14/2017    Review of Systems  Constitutional: Negative.  Negative for fever, malaise/fatigue and weight loss.  HENT: Positive for congestion. Negative for nosebleeds, sinus pain and sore throat.        See HPI  Eyes: Positive for discharge and redness. Negative for blurred vision, double vision and photophobia.  Respiratory: Positive for cough. Negative for shortness of breath.   Cardiovascular: Negative.  Negative for chest pain, palpitations and leg swelling.  Gastrointestinal: Negative.  Negative for heartburn, nausea and vomiting.  Genitourinary: Negative.   Musculoskeletal: Negative.  Negative for myalgias.  Skin: Negative.   Neurological: Positive for sensory change and focal weakness. Negative for dizziness, seizures and headaches.  Psychiatric/Behavioral: Negative.  Negative for suicidal ideas.    Past Medical History:   Diagnosis Date  . Bell's palsy   . Diabetes mellitus without complication ([place])   . Hyperlipidemia   . Hypertension     Past Surgical History:  Procedure Laterality Date  . CESAREAN SECTION    . CHOLECYSTECTOMY      Family History  Problem Relation Age of Onset  . Diabetes Mother   . Diabetes Father     Social History and Family History Reviewed with no changes to be made today.   No facility-administered medications prior to visit.    Outpatient Medications Prior to Visit  Medication Sig Dispense Refill  . acetaminophen (TYLENOL) 500 MG tablet Take 1,000 mg by mouth every 6 (six) hours as needed for mild pain.     . blood glucose meter kit and supplies Dispense based on patient and insurance preference. Use up to four times daily as directed. (E11.8). 1 each 0  . insulin glargine (LANTUS) 100 UNIT/ML injection Inject 0.6 mLs (60 Units total) into the skin at bedtime. 10 mL 11  .  Insulin Syringes, Disposable, U-100 1 ML MISC 1 each by Does not apply route daily. 90 each 3  . naproxen (NAPROSYN) 500 MG tablet Take 1 tablet (500 mg total) by mouth 2 (two) times daily with a meal. (Patient not taking: Reported on 02/11/2018) 30 tablet 0  . triamcinolone ointment (KENALOG) 0.5 % Apply 1 application topically 2 (two) times daily. (Patient not taking: Reported on 02/11/2018) 30 g 0  . atorvastatin (LIPITOR) 40 MG tablet Take 1 tablet (40 mg total) by mouth daily. (Patient not taking: Reported on 02/11/2018) 90 tablet 3  . Emollient (CETAPHIL) cream Apply topically as needed. (Patient not taking: Reported on 02/11/2018) 90 g 0  . ezetimibe (ZETIA) 10 MG tablet Take 1 tablet (10 mg total) by mouth daily. (Patient not taking: Reported on 02/11/2018) 90 tablet 3  . glimepiride (AMARYL) 4 MG tablet Take 1 tablet (4 mg total) by mouth daily before breakfast. (Patient not taking: Reported on 02/11/2018) 30 tablet 3  . metFORMIN (GLUCOPHAGE) 500 MG tablet Take 2 tablets (1,000 mg total) by mouth 2  (two) times daily with a meal. (Patient not taking: Reported on 02/11/2018) 120 tablet 11    No Known Allergies     Objective:    BP 130/81 (BP Location: Left Arm, Patient Position: Sitting, Cuff Size: Normal)   Pulse 74   Temp 98.1 F (36.7 C) (Oral)   Ht 4' 8"  (1.422 m)   Wt 188 lb 3.2 oz (85.4 kg)   SpO2 95%   BMI 42.19 kg/m  Wt Readings from Last 3 Encounters:  02/11/18 188 lb 3.2 oz (85.4 kg)  11/13/17 181 lb (82.1 kg)  09/22/17 182 lb 12.8 oz (82.9 kg)    Physical Exam  Constitutional: She is oriented to person, place, and time. She appears well-developed and well-nourished. She is cooperative.  HENT:  Head: Normocephalic and atraumatic.  Right Ear: Hearing, tympanic membrane, external ear and ear canal normal.  Left Ear: Hearing, tympanic membrane, external ear and ear canal normal.  Nose: Mucosal edema and rhinorrhea present.  Mouth/Throat: Oropharynx is clear and moist and mucous membranes are normal. Abnormal dentition. Dental caries present.  Eyes: EOM are normal. Right conjunctiva is injected.  She is unable to keep right eye closed when I attempt to lift both upper eye lids  Neck: Normal range of motion.  Cardiovascular: Normal rate, regular rhythm, normal heart sounds and intact distal pulses. Exam reveals no gallop and no friction rub.  No murmur heard. Pulmonary/Chest: Effort normal and breath sounds normal. No tachypnea. No respiratory distress. She has no decreased breath sounds. She has no wheezes. She has no rhonchi. She has no rales. She exhibits no tenderness.  Abdominal: Soft. Bowel sounds are normal.  Musculoskeletal: Normal range of motion. She exhibits no edema.  Neurological: She is alert and oriented to person, place, and time. A cranial nerve deficit (CN VII) and sensory deficit is present. Coordination and gait normal.  Motor Strength 4/5  RUE Motor Strength 5/5 LUE Motor Strength 5/5 BLE  Skin: Skin is warm and dry.  Psychiatric: She has a  normal mood and affect. Her behavior is normal. Judgment and thought content normal.  Nursing note and vitals reviewed.      Patient has been counseled extensively about nutrition and exercise as well as the importance of adherence with medications and regular follow-up. The patient was given clear instructions to go to ER or return to medical center if symptoms don't improve, worsen or new  problems develop. The patient verbalized understanding.   Follow-up: Return in about 1 month (around 03/11/2018).  F/U RIGHT FACIAL [place], FNP-BC [facility] [place], [place]   02/11/2018, 12:48 PM

## 2018-03-12 MED FILL — JANUMET 50-1,000 MG TABLET: 50-1000 | 30 days supply | Qty: 60 | Fill #1

## 2018-03-12 MED FILL — ATORVASTATIN CALCIUM 40 MG: 40 | 30 days supply | Qty: 30 | Fill #1

## 2018-03-12 MED FILL — !LANTUS SOLOSTAR 100UNITS/M: 100 | 30 days supply | Qty: 6 | Fill #1

## 2018-03-12 MED FILL — EZETIMIBE 10 MG TABS: 10 | 30 days supply | Qty: 30 | Fill #1

## 2018-03-12 MED FILL — GLIMEPIRIDE 4 MG TABLET: 4 | 30 days supply | Qty: 30 | Fill #1

## 2018-04-17 MED FILL — ATORVASTATIN CALCIUM 40 MG: 40 | 30 days supply | Qty: 30 | Fill #2

## 2018-04-17 MED FILL — EZETIMIBE 10 MG TABS: 10 | 30 days supply | Qty: 30 | Fill #2

## 2018-04-17 MED FILL — !JANUMET 50-1000MG TABLET: 50-1000 | 30 days supply | Qty: 60 | Fill #2

## 2018-04-17 MED FILL — !LANTUS SOLOSTAR 100UNITS/M: 100 | 30 days supply | Qty: 6 | Fill #2

## 2018-04-17 MED FILL — GLIMEPIRIDE 4 MG TABLET: 4 | 30 days supply | Qty: 30 | Fill #2

## 2018-05-14 ENCOUNTER — Encounter (INDEPENDENT_AMBULATORY_CARE_PROVIDER_SITE_OTHER): Payer: Self-pay | Admitting: Physician Assistant

## 2018-05-14 ENCOUNTER — Other Ambulatory Visit: Payer: Self-pay

## 2018-05-14 ENCOUNTER — Ambulatory Visit (INDEPENDENT_AMBULATORY_CARE_PROVIDER_SITE_OTHER): Payer: Self-pay | Admitting: Physician Assistant

## 2018-05-14 VITALS — BP 108/71 | HR 57 | Temp 97.9°F | Ht <= 58 in | Wt 200.4 lb

## 2018-05-14 DIAGNOSIS — IMO0002 Reserved for concepts with insufficient information to code with codable children: Secondary | ICD-10-CM

## 2018-05-14 DIAGNOSIS — Z23 Encounter for immunization: Secondary | ICD-10-CM

## 2018-05-14 DIAGNOSIS — Z1211 Encounter for screening for malignant neoplasm of colon: Secondary | ICD-10-CM

## 2018-05-14 DIAGNOSIS — E118 Type 2 diabetes mellitus with unspecified complications: Secondary | ICD-10-CM

## 2018-05-14 DIAGNOSIS — E1165 Type 2 diabetes mellitus with hyperglycemia: Secondary | ICD-10-CM

## 2018-05-14 DIAGNOSIS — E7841 Elevated Lipoprotein(a): Secondary | ICD-10-CM

## 2018-05-14 LAB — POCT GLYCOSYLATED HEMOGLOBIN (HGB A1C): HEMOGLOBIN A1C: 7.5 % — AB (ref 4.0–5.6)

## 2018-05-14 MED ORDER — GLIMEPIRIDE 4 MG PO TABS: 4.0000 mg | ORAL_TABLET | Freq: Every day | ORAL | 5 refills | Status: DC

## 2018-05-14 MED ORDER — ATORVASTATIN CALCIUM 40 MG PO TABS: 40.0000 mg | ORAL_TABLET | Freq: Every day | ORAL | 5 refills | Status: DC

## 2018-05-14 MED ORDER — SITAGLIPTIN PHOS-METFORMIN HCL 50-1000 MG PO TABS: 1.0000 | ORAL_TABLET | Freq: Two times a day (BID) | ORAL | 5 refills | Status: DC

## 2018-05-14 MED ORDER — LISINOPRIL 2.5 MG PO TABS: 2.5000 mg | ORAL_TABLET | Freq: Every day | ORAL | 5 refills | Status: DC

## 2018-05-14 MED ORDER — GLUCOSE BLOOD VI STRP: ORAL_STRIP | 12 refills | Status: DC

## 2018-05-14 MED ORDER — EZETIMIBE 10 MG PO TABS: 10.0000 mg | ORAL_TABLET | Freq: Every day | ORAL | 5 refills | Status: DC

## 2018-05-14 MED ORDER — INSULIN GLARGINE 100 UNIT/ML SOLOSTAR PEN: 25.0000 [IU] | PEN_INJECTOR | Freq: Every day | SUBCUTANEOUS | 11 refills | Status: DC

## 2018-05-14 MED ORDER — INSULIN GLARGINE 100 UNIT/ML SOLOSTAR PEN: 60.0000 [IU] | PEN_INJECTOR | Freq: Every day | SUBCUTANEOUS | 11 refills | Status: DC

## 2018-05-14 MED ORDER — INSULIN PEN NEEDLE 31G X 5 MM MISC: 5 refills | Status: DC

## 2018-05-14 MED FILL — TRUEPLUS PEN NDL 31GX3/16: 31G X 5 MM | 30 days supply | Qty: 100 | Fill #0

## 2018-05-14 MED FILL — TRUE METRIX TEST STRIP: 25 days supply | Qty: 100 | Fill #0

## 2018-05-14 MED FILL — !LANTUS SOLOSTAR 100UNITS/M: 100 | 24 days supply | Qty: 6 | Fill #0

## 2018-05-14 MED FILL — LISINOPRIL 2.5 MG TABLET: 2.5 | 30 days supply | Qty: 30 | Fill #0

## 2018-05-14 MED FILL — GLIMEPIRIDE 4 MG TABLET: 4 | 30 days supply | Qty: 30 | Fill #0

## 2018-05-14 MED FILL — TRUEPLUS PEN NDL 31GX3/16": 31G X 5 MM | 30 days supply | Qty: 100 | Fill #0

## 2018-05-14 MED FILL — EZETIMIBE 10 MG TABS: 10 | 30 days supply | Qty: 30 | Fill #0

## 2018-05-14 MED FILL — !JANUMET 50-1000MG TABLET: 50-1000 | 30 days supply | Qty: 60 | Fill #3

## 2018-05-14 MED FILL — ATORVASTATIN CALCIUM 40 MG: 40 | 30 days supply | Qty: 30 | Fill #0

## 2018-05-14 NOTE — Progress Notes (Signed)
Subjective:  Patient ID: Denise Mosley, female    DOB: 06/24/1961  Age: 57 y.o. MRN: [id number]  CC: f/u DM   HPI Denise Medina-Gomezis a 57 y.o.femalewith a medical history of DM2 and HTN presents for DM2 f/u. Last A1c 11.3% three months ago. Pt has been taking 20 units of Lantus instead of 60 units. Began new rx of Janumet which replaced metformin. Continues glimepiride 4 mg. A1c 7.5% today. Says she has been eating healthy low carb diet as of the last visit. Does not exercise regularly. Denies CP, palpitations, SOB, HA, visual blurring, polydipsia, abdominal pain, f/c/n/v, rash, swelling, or GI/GU symptoms.     Outpatient Medications Prior to Visit  Medication Sig Dispense Refill  . acetaminophen (TYLENOL) 500 MG tablet Take 1,000 mg by mouth every 6 (six) hours as needed for mild pain.     Marland Kitchen atorvastatin (LIPITOR) 40 MG tablet Take 1 tablet (40 mg total) by mouth daily. 90 tablet 3  . blood glucose meter kit and supplies Dispense based on patient and insurance preference. Use up to four times daily as directed. (E11.8). 1 each 0  . Blood Glucose Monitoring Suppl (TRUE METRIX METER) w/Device KIT Use as instructed 1 kit 0  . ezetimibe (ZETIA) 10 MG tablet Take 1 tablet (10 mg total) by mouth daily. 90 tablet 3  . glimepiride (AMARYL) 4 MG tablet Take 1 tablet (4 mg total) by mouth daily before breakfast. 90 tablet 3  . glucose blood (TRUE METRIX BLOOD GLUCOSE TEST) test strip Use as instructed 100 each 12  . Insulin Glargine (LANTUS) 100 UNIT/ML Solostar Pen Inject 60 Units into the skin daily at 10 pm. 15 mL 11  . Insulin Pen Needle (B-D UF III MINI PEN NEEDLES) 31G X 5 MM MISC Use as instructed 100 each 5  . naproxen (NAPROSYN) 500 MG tablet Take 1 tablet (500 mg total) by mouth 2 (two) times daily with a meal. (Patient not taking: Reported on 02/11/2018) 30 tablet 0  . sitaGLIPtin-metformin (JANUMET) 50-1000 MG tablet Take 1 tablet by mouth 2 (two) times daily with a meal. 60 tablet  3  . tetrahydrozoline-zinc (RELIEF EYE DROPS) 0.05-0.25 % ophthalmic solution Place 2 drops into the right eye 3 (three) times daily as needed. 15 mL 0  . triamcinolone ointment (KENALOG) 0.5 % Apply 1 application topically 2 (two) times daily. (Patient not taking: Reported on 02/11/2018) 30 g 0   No facility-administered medications prior to visit.      ROS Review of Systems  Constitutional: Negative for chills, fever and malaise/fatigue.  Eyes: Negative for blurred vision.  Respiratory: Negative for shortness of breath.   Cardiovascular: Negative for chest pain and palpitations.  Gastrointestinal: Negative for abdominal pain and nausea.  Genitourinary: Negative for dysuria and hematuria.  Musculoskeletal: Negative for joint pain and myalgias.  Skin: Negative for rash.  Neurological: Negative for tingling and headaches.  Psychiatric/Behavioral: Negative for depression. The patient is not nervous/anxious.     Objective:  BP 108/71 (BP Location: Left Arm, Patient Position: Sitting, Cuff Size: Large)   Pulse (!) 57   Temp 97.9 F (36.6 C) (Oral)   Ht 4' 8"  (1.422 m)   Wt 200 lb 6.4 oz (90.9 kg)   SpO2 97%   BMI 44.93 kg/m   BP/Weight 05/14/2018 02/11/2018 8/88/2800  Systolic BP 349 179 150  Diastolic BP 71 85 81  Wt. (Lbs) 200.4 - 188.2  BMI 44.93 - 42.19      Physical Exam  Constitutional: She is oriented to person, place, and time.  Well developed, well nourished, NAD, polite  HENT:  Head: Normocephalic and atraumatic.  Eyes: No scleral icterus.  Neck: Normal range of motion. Neck supple. No thyromegaly present.  Cardiovascular: Normal rate, regular rhythm and normal heart sounds.  No LE edema bilaterally  Pulmonary/Chest: Effort normal and breath sounds normal.  Abdominal: Soft. Bowel sounds are normal. There is no tenderness.  Musculoskeletal: She exhibits no edema.  Neurological: She is alert and oriented to person, place, and time.  Skin: Skin is warm and dry.  No rash noted. No erythema. No pallor.  Psychiatric: She has a normal mood and affect. Her behavior is normal. Thought content normal.  Vitals reviewed.    Assessment & Plan:   1. Uncontrolled type 2 diabetes mellitus with complication (HCC) - HgB A1c7.5% - Refill lisinopril (PRINIVIL,ZESTRIL) 2.5 MG tablet; Take 1 tablet (2.5 mg total) by mouth daily.  Dispense: 30 tablet; Refill: 5 - Refill sitaGLIPtin-metformin (JANUMET) 50-1000 MG tablet; Take 1 tablet by mouth 2 (two) times daily with a meal.  Dispense: 60 tablet; Refill: 5 - Refill Insulin Pen Needle (B-D UF III MINI PEN NEEDLES) 31G X 5 MM MISC; Use as instructed  Dispense: 100 each; Refill: 5 - Refill glucose blood (TRUE METRIX BLOOD GLUCOSE TEST) test strip; Use as instructed  Dispense: 100 each; Refill: 12 - Begin glimepiride (AMARYL) 4 MG tablet; Take 1 tablet (4 mg total) by mouth daily before breakfast.  Dispense: 30 tablet; Refill: 5 - Decrease Insulin Glargine (LANTUS) 100 UNIT/ML Solostar Pen; Inject 25 Units into the skin daily at 10 pm.  Dispense: 3 pen; Refill: 11  2. Elevated lipoprotein(a) - Refill ezetimibe (ZETIA) 10 MG tablet; Take 1 tablet (10 mg total) by mouth daily.  Dispense: 30 tablet; Refill: 5 - Refill atorvastatin (LIPITOR) 40 MG tablet; Take 1 tablet (40 mg total) by mouth daily.  Dispense: 30 tablet; Refill: 5  3. Need for prophylactic vaccination and inoculation against influenza - Administered Flu Vaccine QUAD 6+ mos PF IM (Fluarix Quad PF)  4. Special screening for malignant neoplasms, colon - Fecal occult blood, imunochemical. Second kit.    Meds ordered this encounter  Medications  . lisinopril (PRINIVIL,ZESTRIL) 2.5 MG tablet    Sig: Take 1 tablet (2.5 mg total) by mouth daily.    Dispense:  30 tablet    Refill:  5    Order Specific Question:   Supervising Provider    Answer:   Charlott Rakes [[id number]]  . sitaGLIPtin-metformin (JANUMET) 50-1000 MG tablet    Sig: Take 1 tablet by mouth 2  (two) times daily with a meal.    Dispense:  60 tablet    Refill:  5    Order Specific Question:   Supervising Provider    Answer:   Charlott Rakes [[id number]]  . Insulin Pen Needle (B-D UF III MINI PEN NEEDLES) 31G X 5 MM MISC    Sig: Use as instructed    Dispense:  100 each    Refill:  5    Order Specific Question:   Supervising Provider    Answer:   Charlott Rakes [[id number]]  . DISCONTD: Insulin Glargine (LANTUS) 100 UNIT/ML Solostar Pen    Sig: Inject 60 Units into the skin daily at 10 pm.    Dispense:  15 mL    Refill:  11    Order Specific Question:   Supervising Provider    Answer:   Charlott Rakes [[id number]]  .  glucose blood (TRUE METRIX BLOOD GLUCOSE TEST) test strip    Sig: Use as instructed    Dispense:  100 each    Refill:  12    Order Specific Question:   Supervising Provider    Answer:   Charlott Rakes [[id number]]  . glimepiride (AMARYL) 4 MG tablet    Sig: Take 1 tablet (4 mg total) by mouth daily before breakfast.    Dispense:  30 tablet    Refill:  5    Order Specific Question:   Supervising Provider    Answer:   Charlott Rakes [[id number]]  . ezetimibe (ZETIA) 10 MG tablet    Sig: Take 1 tablet (10 mg total) by mouth daily.    Dispense:  30 tablet    Refill:  5    Order Specific Question:   Supervising Provider    Answer:   Charlott Rakes [[id number]]  . atorvastatin (LIPITOR) 40 MG tablet    Sig: Take 1 tablet (40 mg total) by mouth daily.    Dispense:  30 tablet    Refill:  5    Order Specific Question:   Supervising Provider    Answer:   Charlott Rakes [[id number]]  . Insulin Glargine (LANTUS) 100 UNIT/ML Solostar Pen    Sig: Inject 25 Units into the skin daily at 10 pm.    Dispense:  3 pen    Refill:  11    Please disregard previous rx for 60 units.    Order Specific Question:   Supervising Provider    Answer:   Charlott Rakes [[id number]]    Follow-up: 3 months DM  Clent Demark PA

## 2018-05-14 NOTE — Patient Instructions (Addendum)
Diabetes mellitus y nutrición  Diabetes Mellitus and Nutrition  Si sufre de diabetes (diabetes mellitus), es muy importante tener hábitos alimenticios saludables debido a que sus niveles de azúcar en la sangre (glucosa) se ven afectados en gran medida por lo que come y bebe. Comer alimentos saludables en las cantidades adecuadas, aproximadamente a la misma hora todos los días, lo ayudará a:  · Controlar la glucemia.  · Disminuir el riesgo de sufrir una enfermedad cardíaca.  · Mejorar la presión arterial.  · Alcanzar o mantener un peso saludable.    Todas las personas que sufren de diabetes son diferentes y cada una tiene necesidades diferentes en cuanto a un plan de alimentación. El médico puede recomendarle que trabaje con un especialista en dietas y nutrición (nutricionista) para elaborar el mejor plan para usted. Su plan de alimentación puede variar según factores como:  · Las calorías que necesita.  · Los medicamentos que toma.  · Su peso.  · Sus niveles de glucemia, presión arterial y colesterol.  · Su nivel de actividad.  · Otras afecciones que tenga, como enfermedades cardíacas o renales.    ¿Cómo me afectan los carbohidratos?  Los carbohidratos afectan el nivel de glucemia más que cualquier otro tipo de alimento. La ingesta de carbohidratos naturalmente aumenta la cantidad glucosa en la sangre. El recuento de carbohidratos es un método destinado a llevar un registro de la cantidad de carbohidratos que se ingieren. El recuento de carbohidratos es importante para mantener la glucemia a un nivel saludable, en especial si utiliza insulina o toma determinados medicamentos por vía oral para la diabetes.  Es importante saber la cantidad de carbohidratos que se pueden ingerir en cada comida sin correr ningún riesgo. Esto es diferente en cada persona. El nutricionista puede ayudarlo a calcular la cantidad de carbohidratos que debe ingerir en cada comida y colación.   Los alimentos que contienen carbohidratos incluyen:  · Pan, cereal, arroz, pasta y galletas.  · Papas y maíz.  · Guisantes, frijoles y lentejas.  · Leche y yogur.  · Frutas y jugo.  · Postres, como pasteles, galletitas, helado y caramelos.    ¿Cómo me afecta el alcohol?  El alcohol puede provocar disminuciones súbitas de la glucemia (hipoglucemia), en especial si utiliza insulina o toma determinados medicamentos por vía oral para la diabetes. La hipoglucemia es una afección potencialmente mortal. Los síntomas de la hipoglucemia (somnolencia, mareos y confusión) son similares a los síntomas de haber consumido demasiado alcohol.  Si el médico afirma que el alcohol es seguro para usted, siga estas pautas:  · Limite el consumo de alcohol a no más de 1 medida por día si es mujer y no está embarazada, y a 2 medidas si es hombre. Una medida equivale a 12 oz (355 ml) de cerveza, 5 oz (148 ml) de vino o 1½ oz (44 ml) de bebidas de alta graduación alcohólica.  · No beba con el estómago vacío.  · Manténgase hidratado con agua, gaseosas dietéticas o té helado sin azúcar.  · Tenga en cuenta que las gaseosas comunes, los jugos y otros refrescos pueden contener mucha azúcar y se deben contar como carbohidratos.    Consejos para seguir este plan  Leer las etiquetas de los alimentos  · Comience por controlar el tamaño de la porción en la etiqueta. La cantidad de calorías, carbohidratos, grasas y otros nutrientes mencionados en la etiqueta se basan en una porción del alimento. Muchos alimentos contienen más de una porción por envase.  · Verifique la cantidad total de gramos (g)   de carbohidratos totales en una porción. Puede calcular la cantidad de porciones de carbohidratos al dividir el total de carbohidratos por 15. Por ejemplo, si un alimento posee un total de 30 g de carbohidratos, equivale a 2 porciones de carbohidratos.  · Verifique la cantidad de gramos (g) de grasas saturadas y grasas trans  en una porción. Escoja alimentos que no contengan grasa o que tengan un bajo contenido.  · Controle la cantidad de miligramos (mg) de sodio en una porción. La mayoría de las personas deben limitar la ingesta de sodio total a menos de 2300 mg por día.  · Siempre consulte la información nutricional de los alimentos etiquetados como “con bajo contenido de grasa” o “sin grasa”. Estos alimentos pueden ser más altos en azúcar agregada o en carbohidratos refinados y deben evitarse.  · Hable con el nutricionista para identificar sus objetivos diarios en cuanto a los nutrientes mencionados en la etiqueta.  De compras  · Evite comprar alimentos procesados, enlatados o prehechos. Estos alimentos tienden a tener mayor cantidad de grasa, sodio y azúcar agregada.  · Compre en la zona exterior de la tienda de comestibles. Esta incluye frutas y vegetales frescos, granos a granel, carnes frescas y productos lácteos frescos.  Cocción  · Utilice métodos de cocción a baja temperatura, como hornear, en lugar de métodos de cocción a alta temperatura, como freír en abundante aceite.  · Cocine con aceites saludables, como el aceite de oliva, canola o girasol.  · Evite cocinar con manteca, crema o carnes con alto contenido de grasa.  Planificación de las comidas  · Consuma las comidas y las colaciones de forma regular, preferentemente a la misma hora todos los días. Evite pasar largos períodos de tiempo sin comer.  · Consuma alimentos ricos en fibra, como frutas frescas, verduras, frijoles y cereales integrales. Consulte al nutricionista sobre cuántas porciones de carbohidratos puede consumir en cada comida.  · Consuma entre 4 y 6 onzas de proteínas magras por día, como carnes magras, pollo, pescado, huevos o tofu. 1 onza equivale a 1 onza de carne, pollo o pescado, 1 huevo, o 1/4 taza de tofu.  · Coma algunos alimentos por día que contengan grasas saludables, como aguacates, frutos secos, semillas y pescado.  Estilo de vida     · Controle su nivel de glucemia con regularidad.  · Haga ejercicio al menos 30 minutos, 5 días o más por semana, o como se lo haya indicado el médico.  · Tome los medicamentos como se lo haya indicado el médico.  · No consuma ningún producto que contenga nicotina o tabaco, como cigarrillos y cigarrillos electrónicos. Si necesita ayuda para dejar de fumar, consulte al médico.  · Trabaje con un asesor o instructor en diabetes para identificar estrategias para controlar el estrés y cualquier desafío emocional y social.  ¿Cuáles son algunas de las preguntas que puedo hacerle a mi médico?  · ¿Es necesario que me reúna con un instructor en diabetes?  · ¿Es necesario que me reúna con un nutricionista?  · ¿A qué número puedo llamar si tengo preguntas?  · ¿Cuáles son los mejores momentos para controlar la glucemia?  Dónde encontrar más información:  · Asociación Americana de la Diabetes (American Diabetes Association): diabetes.org/food-and-fitness/food  · Academia de Nutrición y Dietética (Academy of Nutrition and Dietetics): www.eatright.org/resources/health/diseases-and-conditions/diabetes  · Instituto Nacional de la Diabetes y las Enfermedades Digestivas y Renales (National Institute of Diabetes and Digestive and Kidney Diseases) (Institutos Nacionales de Salud, NIH): www.niddk.nih.gov/health-information/diabetes/overview/diet-eating-physical-activity  Resumen  · Un plan de alimentación saludable   lo ayudará a controlar la glucemia y mantener un estilo de vida saludable.  · Trabajar con un especialista en dietas y nutrición (nutricionista) puede ayudarlo a elaborar el mejor plan de alimentación para usted.  · Tenga en cuenta que los carbohidratos y el alcohol tienen efectos inmediatos en sus niveles de glucemia. Es importante contar los carbohidratos y consumir alcohol con prudencia.  Esta información no tiene como fin reemplazar el consejo del médico. Asegúrese de hacerle al médico cualquier pregunta que tenga.   Document Released: 12/10/2007 Document Revised: 12/23/2016 Document Reviewed: 12/23/2016  Elsevier Interactive Patient Education © 2018 [employer]

## 2018-05-15 ENCOUNTER — Other Ambulatory Visit (INDEPENDENT_AMBULATORY_CARE_PROVIDER_SITE_OTHER): Payer: Self-pay | Admitting: Physician Assistant

## 2018-05-15 ENCOUNTER — Telehealth (INDEPENDENT_AMBULATORY_CARE_PROVIDER_SITE_OTHER): Payer: Self-pay

## 2018-05-15 DIAGNOSIS — E781 Pure hyperglyceridemia: Secondary | ICD-10-CM

## 2018-05-15 LAB — COMPREHENSIVE METABOLIC PANEL
A/G RATIO: 1.2 (ref 1.2–2.2)
ALBUMIN: 4.2 g/dL (ref 3.5–5.5)
ALT: 27 IU/L (ref 0–32)
AST: 20 IU/L (ref 0–40)
Alkaline Phosphatase: 182 IU/L — ABNORMAL HIGH (ref 39–117)
BILIRUBIN TOTAL: 0.2 mg/dL (ref 0.0–1.2)
BUN/Creatinine Ratio: 18 (ref 9–23)
BUN: 11 mg/dL (ref 6–24)
CHLORIDE: 102 mmol/L (ref 96–106)
CO2: 25 mmol/L (ref 20–29)
Calcium: 9.4 mg/dL (ref 8.7–10.2)
Creatinine, Ser: 0.61 mg/dL (ref 0.57–1.00)
GFR calc non Af Amer: 102 mL/min/{1.73_m2} (ref >59)
GFR, EST AFRICAN AMERICAN: 118 mL/min/{1.73_m2} (ref >59)
GLOBULIN, TOTAL: 3.5 g/dL (ref 1.5–4.5)
Glucose: 114 mg/dL — ABNORMAL HIGH (ref 65–99)
Potassium: 4 mmol/L (ref 3.5–5.2)
SODIUM: 141 mmol/L (ref 134–144)
TOTAL PROTEIN: 7.7 g/dL (ref 6.0–8.5)

## 2018-05-15 LAB — LIPID PANEL
Chol/HDL Ratio: 4.5 ratio — ABNORMAL HIGH (ref 0.0–4.4)
Cholesterol, Total: 139 mg/dL (ref 100–199)
HDL: 31 mg/dL — AB (ref >39)
LDL Calculated: 36 mg/dL (ref 0–99)
TRIGLYCERIDES: 359 mg/dL — AB (ref 0–149)
VLDL Cholesterol Cal: 72 mg/dL — ABNORMAL HIGH (ref 5–40)

## 2018-05-15 MED ORDER — OMEGA-3-ACID ETHYL ESTERS 1 G PO CAPS: 2.0000 g | ORAL_CAPSULE | Freq: Two times a day (BID) | ORAL | 5 refills | Status: DC

## 2018-05-15 MED FILL — OMEGA-3 ETHYL ESTERS 1 GM C: 1 | 30 days supply | Qty: 120 | Fill #0

## 2018-05-15 NOTE — Telephone Encounter (Signed)
-----   Message from Loletta Specteroger David Gomez, PA-C sent at 05/15/2018 12:32 PM EDT ----- Bad cholesterol is much better but triglycerides are still elevated. I have sent fish oil pills to [facility] to help lower trigs. Try to eat less sweets,less fried foods, no more than two servings of fruits per day, and less refined grains.

## 2018-05-15 NOTE — Telephone Encounter (Signed)
Call placed using pacific interpreter [phone] patient voicemail not setup yet. Will attempt to call once more before mailing results. If patient calls office please notify that her bad cholesterol is much better but triglycerides are still elevated. Fish oil pills have been sent to [facility] pharmacy to help lower triglycerides. Try to eat less sweets, fried foods and less refined grains such as white flour. Do not consume more than two servings of fruits per day. Maryjean Mornempestt S Lillyen Schow, CMA

## 2018-05-21 ENCOUNTER — Telehealth (INDEPENDENT_AMBULATORY_CARE_PROVIDER_SITE_OTHER): Payer: Self-pay

## 2018-05-21 NOTE — Telephone Encounter (Signed)
Call placed using pacific interpreter [phone]) patient is aware that her bad cholesterol is much better but triglycerides are still elevated. Fish oil pills have been sent to [facility] pharmacy to help lower triglycerides. Advised to try to eat less sweets, less fried foods, less refined grains and no more than 2 servings of fruit per day. Patient stated she adds tortillas to her meal because they help her get full. Per PCP patient instructed to not consume tortillas as they are made of flour( which is a refined grain). Patient expressed understanding. Maryjean Morn, CMA

## 2018-05-21 NOTE — Telephone Encounter (Signed)
-----   Message from Loletta Specter, PA-C sent at 05/15/2018 12:32 PM EDT ----- Bad cholesterol is much better but triglycerides are still elevated. I have sent fish oil pills to [facility] to help lower trigs. Try to eat less sweets,less fried foods, no more than two servings of fruits per day, and less refined grains.

## 2018-06-17 MED FILL — !LANTUS SOLOSTAR 100UNITS/M: 100 | 24 days supply | Qty: 6 | Fill #1

## 2018-07-17 MED FILL — LANTUS SOLOSTAR 100 UNITS/M: 100 | 24 days supply | Qty: 6 | Fill #2

## 2018-07-17 MED FILL — ATORVASTATIN CALCIUM 40 MG: 40 | 30 days supply | Qty: 30 | Fill #1

## 2018-07-17 MED FILL — LISINOPRIL 2.5 MG TABLET: 2.5 | 30 days supply | Qty: 30 | Fill #1

## 2018-07-17 MED FILL — GLIMEPIRIDE 4 MG TABLET: 4 | 30 days supply | Qty: 30 | Fill #1

## 2018-08-12 ENCOUNTER — Ambulatory Visit (INDEPENDENT_AMBULATORY_CARE_PROVIDER_SITE_OTHER): Payer: Self-pay | Admitting: Physician Assistant

## 2018-08-12 ENCOUNTER — Other Ambulatory Visit: Payer: Self-pay

## 2018-08-12 ENCOUNTER — Encounter (INDEPENDENT_AMBULATORY_CARE_PROVIDER_SITE_OTHER): Payer: Self-pay | Admitting: Physician Assistant

## 2018-08-12 VITALS — BP 108/69 | HR 66 | Temp 97.9°F | Ht <= 58 in | Wt 203.6 lb

## 2018-08-12 DIAGNOSIS — Z1211 Encounter for screening for malignant neoplasm of colon: Secondary | ICD-10-CM

## 2018-08-12 DIAGNOSIS — H04203 Unspecified epiphora, bilateral lacrimal glands: Secondary | ICD-10-CM

## 2018-08-12 DIAGNOSIS — E7841 Elevated Lipoprotein(a): Secondary | ICD-10-CM

## 2018-08-12 DIAGNOSIS — E119 Type 2 diabetes mellitus without complications: Secondary | ICD-10-CM

## 2018-08-12 DIAGNOSIS — Z794 Long term (current) use of insulin: Secondary | ICD-10-CM

## 2018-08-12 DIAGNOSIS — E781 Pure hyperglyceridemia: Secondary | ICD-10-CM

## 2018-08-12 LAB — POCT GLYCOSYLATED HEMOGLOBIN (HGB A1C): HEMOGLOBIN A1C: 7.3 % — AB (ref 4.0–5.6)

## 2018-08-12 MED ORDER — EZETIMIBE 10 MG PO TABS: 10.0000 mg | ORAL_TABLET | Freq: Every day | ORAL | 5 refills | Status: DC

## 2018-08-12 MED ORDER — SITAGLIPTIN PHOS-METFORMIN HCL 50-1000 MG PO TABS: 1.0000 | ORAL_TABLET | Freq: Two times a day (BID) | ORAL | 5 refills | Status: DC

## 2018-08-12 MED ORDER — GLIMEPIRIDE 4 MG PO TABS: 4.0000 mg | ORAL_TABLET | Freq: Every day | ORAL | 5 refills | Status: DC

## 2018-08-12 MED ORDER — ATORVASTATIN CALCIUM 40 MG PO TABS
40.0000 mg | ORAL_TABLET | Freq: Every day | ORAL | 5 refills | Status: DC
Start: 2018-08-12 — End: 2019-03-25

## 2018-08-12 MED ORDER — INSULIN PEN NEEDLE 31G X 5 MM MISC: 5 refills | Status: DC

## 2018-08-12 MED ORDER — OMEGA-3-ACID ETHYL ESTERS 1 G PO CAPS: 2.0000 g | ORAL_CAPSULE | Freq: Two times a day (BID) | ORAL | 5 refills | Status: DC

## 2018-08-12 MED ORDER — INSULIN GLARGINE 100 UNIT/ML SOLOSTAR PEN: 35.0000 [IU] | PEN_INJECTOR | Freq: Every day | SUBCUTANEOUS | 11 refills | Status: DC

## 2018-08-12 MED ORDER — LISINOPRIL 2.5 MG PO TABS: 2.5000 mg | ORAL_TABLET | Freq: Every day | ORAL | 5 refills | Status: DC

## 2018-08-12 MED FILL — GLIMEPIRIDE 4 MG TABS: 4 | 30 days supply | Qty: 30 | Fill #0

## 2018-08-12 MED FILL — EZETIMIBE 10 MG TAB: 10 | 30 days supply | Qty: 30 | Fill #0

## 2018-08-12 MED FILL — !LANTUS SOLOSTAR 100UNITS/M: 100 | 25 days supply | Qty: 9 | Fill #0

## 2018-08-12 MED FILL — TRUEPLUS PEN NDL 31G X 1/4: 31G X 6 MM | 30 days supply | Qty: 100 | Fill #0

## 2018-08-12 MED FILL — LISINOPRIL 2.5 MG TABLET: 2.5 | 30 days supply | Qty: 30 | Fill #0

## 2018-08-12 MED FILL — ATORVASTATIN CALCIUM 40 MG: 40 | 30 days supply | Qty: 30 | Fill #0

## 2018-08-12 MED FILL — OMEGA-3 ETHYL ESTERS 1 GM C: 1 | 30 days supply | Qty: 120 | Fill #1

## 2018-08-12 NOTE — Progress Notes (Signed)
Subjective:  Patient ID: Denise Mosley, female    DOB: August 28, 1961  Age: 57 y.o. MRN: [id number]  CC: f/u DM2  HPI Denise Medina-Gomezis a 57 y.o.femalewith a medical history of DM2, HTN, PNA, and right sided Bell's Palsy presents for DM2 f/u. A1c 7.5% three months ago. Anti-glycemic regimen adjusted. Glimepiride 4 mg prescribed, Lantus prescribed at 25 units daily. Advised to continue Janumet. Taking medications as directed. A1c 7.3% today. Does not endorse any symptoms except for watery right eye. Attributed to right sided Bell's Palsy that occurred on 08/21/16. Has not seen and ophthalmologist. Does not endorse allergies, purulent discharge, eye pain, eye swelling, visual disturbances, or HA.      Outpatient Medications Prior to Visit  Medication Sig Dispense Refill  . acetaminophen (TYLENOL) 500 MG tablet Take 1,000 mg by mouth every 6 (six) hours as needed for mild pain.     Marland Kitchen atorvastatin (LIPITOR) 40 MG tablet Take 1 tablet (40 mg total) by mouth daily. 30 tablet 5  . blood glucose meter kit and supplies Dispense based on patient and insurance preference. Use up to four times daily as directed. (E11.8). 1 each 0  . Blood Glucose Monitoring Suppl (TRUE METRIX METER) w/Device KIT Use as instructed 1 kit 0  . ezetimibe (ZETIA) 10 MG tablet Take 1 tablet (10 mg total) by mouth daily. 30 tablet 5  . glimepiride (AMARYL) 4 MG tablet Take 1 tablet (4 mg total) by mouth daily before breakfast. 30 tablet 5  . glucose blood (TRUE METRIX BLOOD GLUCOSE TEST) test strip Use as instructed 100 each 12  . Insulin Glargine (LANTUS) 100 UNIT/ML Solostar Pen Inject 25 Units into the skin daily at 10 pm. 3 pen 11  . Insulin Pen Needle (B-D UF III MINI PEN NEEDLES) 31G X 5 MM MISC Use as instructed 100 each 5  . lisinopril (PRINIVIL,ZESTRIL) 2.5 MG tablet Take 1 tablet (2.5 mg total) by mouth daily. 30 tablet 5  . omega-3 acid ethyl esters (LOVAZA) 1 g capsule Take 2 capsules (2 g total) by mouth 2 (two)  times daily. 120 capsule 5  . sitaGLIPtin-metformin (JANUMET) 50-1000 MG tablet Take 1 tablet by mouth 2 (two) times daily with a meal. 60 tablet 5  . tetrahydrozoline-zinc (RELIEF EYE DROPS) 0.05-0.25 % ophthalmic solution Place 2 drops into the right eye 3 (three) times daily as needed. 15 mL 0   No facility-administered medications prior to visit.      ROS Review of Systems  Constitutional: Negative for chills, fever and malaise/fatigue.  Eyes: Negative for blurred vision.       Right eye watery   Respiratory: Negative for shortness of breath.   Cardiovascular: Negative for chest pain and palpitations.  Gastrointestinal: Negative for abdominal pain and nausea.  Genitourinary: Negative for dysuria and hematuria.  Musculoskeletal: Negative for joint pain and myalgias.  Skin: Negative for rash.  Neurological: Negative for tingling and headaches.  Psychiatric/Behavioral: Negative for depression. The patient is not nervous/anxious.     Objective:  BP 108/69 (BP Location: Left Arm, Patient Position: Sitting, Cuff Size: Normal)   Pulse 66   Temp 97.9 F (36.6 C) (Oral)   Ht 4' 8"  (1.422 m)   Wt 203 lb 9.6 oz (92.4 kg)   SpO2 94%   BMI 45.65 kg/m   BP/Weight 08/12/2018 05/14/2018 03/24/6282  Systolic BP 662 947 654  Diastolic BP 69 71 85  Wt. (Lbs) 203.6 200.4 -  BMI 45.65 44.93 -  Physical Exam  Constitutional: She is oriented to person, place, and time.  Well developed, obese, NAD, polite  HENT:  Head: Normocephalic and atraumatic.  Eyes: No scleral icterus.  Neck: Normal range of motion. Neck supple. No thyromegaly present.  Cardiovascular: Normal rate, regular rhythm and normal heart sounds.  Pulmonary/Chest: Effort normal and breath sounds normal.  Musculoskeletal: She exhibits no edema.  Neurological: She is alert and oriented to person, place, and time.  Skin: Skin is warm and dry. No rash noted. No erythema. No pallor.  Psychiatric: She has a normal mood  and affect. Her behavior is normal. Thought content normal.  Vitals reviewed.    Assessment & Plan:    1. Type 2 diabetes mellitus without complication, with long-term current use of insulin (HCC) - HgB A1c 7.3% - Insulin Glargine (LANTUS) 100 UNIT/ML Solostar Pen; Inject 35 Units into the skin daily at 10 pm.  Dispense: 4 pen; Refill: 11 - sitaGLIPtin-metformin (JANUMET) 50-1000 MG tablet; Take 1 tablet by mouth 2 (two) times daily with a meal.  Dispense: 60 tablet; Refill: 5 - glimepiride (AMARYL) 4 MG tablet; Take 1 tablet (4 mg total) by mouth daily before breakfast.  Dispense: 30 tablet; Refill: 5 - Insulin Pen Needle (B-D UF III MINI PEN NEEDLES) 31G X 5 MM MISC; Use as instructed  Dispense: 100 each; Refill: 5 - lisinopril (PRINIVIL,ZESTRIL) 2.5 MG tablet; Take 1 tablet (2.5 mg total) by mouth daily.  Dispense: 30 tablet; Refill: 5  2. Special screening for malignant neoplasms, colon - Fecal occult blood, imunochemical  3. Watery eyes - Ambulatory referral to Ophthalmology  4. Elevated lipoprotein - ezetimibe (ZETIA) 10 MG tablet; Take 1 tablet (10 mg total) by mouth daily.  Dispense: 30 tablet; Refill: 5 - atorvastatin (LIPITOR) 40 MG tablet; Take 1 tablet (40 mg total) by mouth daily.  Dispense: 30 tablet; Refill: 5  5. Hypertriglyceridemia - omega-3 acid ethyl esters (LOVAZA) 1 g capsule; Take 2 capsules (2 g total) by mouth 2 (two) times daily.  Dispense: 120 capsule; Refill: 5   Meds ordered this encounter  Medications  . Insulin Glargine (LANTUS) 100 UNIT/ML Solostar Pen    Sig: Inject 35 Units into the skin daily at 10 pm.    Dispense:  4 pen    Refill:  11    Please disregard previous rx for 60 units.    Order Specific Question:   Supervising Provider    Answer:   Charlott Rakes [[id number]]  . sitaGLIPtin-metformin (JANUMET) 50-1000 MG tablet    Sig: Take 1 tablet by mouth 2 (two) times daily with a meal.    Dispense:  60 tablet    Refill:  5    Order Specific  Question:   Supervising Provider    Answer:   Charlott Rakes [[id number]]  . glimepiride (AMARYL) 4 MG tablet    Sig: Take 1 tablet (4 mg total) by mouth daily before breakfast.    Dispense:  30 tablet    Refill:  5    Order Specific Question:   Supervising Provider    Answer:   Charlott Rakes [[id number]]  . ezetimibe (ZETIA) 10 MG tablet    Sig: Take 1 tablet (10 mg total) by mouth daily.    Dispense:  30 tablet    Refill:  5    Order Specific Question:   Supervising Provider    Answer:   Charlott Rakes [[id number]]  . atorvastatin (LIPITOR) 40 MG tablet    Sig: Take 1  tablet (40 mg total) by mouth daily.    Dispense:  30 tablet    Refill:  5    Order Specific Question:   Supervising Provider    Answer:   Charlott Rakes [[id number]]  . Insulin Pen Needle (B-D UF III MINI PEN NEEDLES) 31G X 5 MM MISC    Sig: Use as instructed    Dispense:  100 each    Refill:  5    Order Specific Question:   Supervising Provider    Answer:   Charlott Rakes [[id number]]  . lisinopril (PRINIVIL,ZESTRIL) 2.5 MG tablet    Sig: Take 1 tablet (2.5 mg total) by mouth daily.    Dispense:  30 tablet    Refill:  5    Order Specific Question:   Supervising Provider    Answer:   Charlott Rakes [[id number]]  . omega-3 acid ethyl esters (LOVAZA) 1 g capsule    Sig: Take 2 capsules (2 g total) by mouth 2 (two) times daily.    Dispense:  120 capsule    Refill:  5    Order Specific Question:   Supervising Provider    Answer:   Charlott Rakes [[id number]]    Follow-up: Return in about 6 months (around 02/10/2019) for DM.   Clent Demark PA

## 2018-08-12 NOTE — Patient Instructions (Signed)
Diabetes mellitus y nutrición  Diabetes Mellitus and Nutrition  Si sufre de diabetes (diabetes mellitus), es muy importante tener hábitos alimenticios saludables debido a que sus niveles de azúcar en la sangre (glucosa) se ven afectados en gran medida por lo que come y bebe. Comer alimentos saludables en las cantidades adecuadas, aproximadamente a la misma hora todos los días, lo ayudará a:  · Controlar la glucemia.  · Disminuir el riesgo de sufrir una enfermedad cardíaca.  · Mejorar la presión arterial.  · Alcanzar o mantener un peso saludable.    Todas las personas que sufren de diabetes son diferentes y cada una tiene necesidades diferentes en cuanto a un plan de alimentación. El médico puede recomendarle que trabaje con un especialista en dietas y nutrición (nutricionista) para elaborar el mejor plan para usted. Su plan de alimentación puede variar según factores como:  · Las calorías que necesita.  · Los medicamentos que toma.  · Su peso.  · Sus niveles de glucemia, presión arterial y colesterol.  · Su nivel de actividad.  · Otras afecciones que tenga, como enfermedades cardíacas o renales.    ¿Cómo me afectan los carbohidratos?  Los carbohidratos afectan el nivel de glucemia más que cualquier otro tipo de alimento. La ingesta de carbohidratos naturalmente aumenta la cantidad glucosa en la sangre. El recuento de carbohidratos es un método destinado a llevar un registro de la cantidad de carbohidratos que se ingieren. El recuento de carbohidratos es importante para mantener la glucemia a un nivel saludable, en especial si utiliza insulina o toma determinados medicamentos por vía oral para la diabetes.  Es importante saber la cantidad de carbohidratos que se pueden ingerir en cada comida sin correr ningún riesgo. Esto es diferente en cada persona. El nutricionista puede ayudarlo a calcular la cantidad de carbohidratos que debe ingerir en cada comida y colación.   Los alimentos que contienen carbohidratos incluyen:  · Pan, cereal, arroz, pasta y galletas.  · Papas y maíz.  · Guisantes, frijoles y lentejas.  · Leche y yogur.  · Frutas y jugo.  · Postres, como pasteles, galletitas, helado y caramelos.    ¿Cómo me afecta el alcohol?  El alcohol puede provocar disminuciones súbitas de la glucemia (hipoglucemia), en especial si utiliza insulina o toma determinados medicamentos por vía oral para la diabetes. La hipoglucemia es una afección potencialmente mortal. Los síntomas de la hipoglucemia (somnolencia, mareos y confusión) son similares a los síntomas de haber consumido demasiado alcohol.  Si el médico afirma que el alcohol es seguro para usted, siga estas pautas:  · Limite el consumo de alcohol a no más de 1 medida por día si es mujer y no está embarazada, y a 2 medidas si es hombre. Una medida equivale a 12 oz (355 ml) de cerveza, 5 oz (148 ml) de vino o 1½ oz (44 ml) de bebidas de alta graduación alcohólica.  · No beba con el estómago vacío.  · Manténgase hidratado con agua, gaseosas dietéticas o té helado sin azúcar.  · Tenga en cuenta que las gaseosas comunes, los jugos y otros refrescos pueden contener mucha azúcar y se deben contar como carbohidratos.    Consejos para seguir este plan  Leer las etiquetas de los alimentos  · Comience por controlar el tamaño de la porción en la etiqueta. La cantidad de calorías, carbohidratos, grasas y otros nutrientes mencionados en la etiqueta se basan en una porción del alimento. Muchos alimentos contienen más de una porción por envase.  · Verifique la cantidad total de gramos (g)   de carbohidratos totales en una porción. Puede calcular la cantidad de porciones de carbohidratos al dividir el total de carbohidratos por 15. Por ejemplo, si un alimento posee un total de 30 g de carbohidratos, equivale a 2 porciones de carbohidratos.  · Verifique la cantidad de gramos (g) de grasas saturadas y grasas trans  en una porción. Escoja alimentos que no contengan grasa o que tengan un bajo contenido.  · Controle la cantidad de miligramos (mg) de sodio en una porción. La mayoría de las personas deben limitar la ingesta de sodio total a menos de 2300 mg por día.  · Siempre consulte la información nutricional de los alimentos etiquetados como “con bajo contenido de grasa” o “sin grasa”. Estos alimentos pueden ser más altos en azúcar agregada o en carbohidratos refinados y deben evitarse.  · Hable con el nutricionista para identificar sus objetivos diarios en cuanto a los nutrientes mencionados en la etiqueta.  De compras  · Evite comprar alimentos procesados, enlatados o prehechos. Estos alimentos tienden a tener mayor cantidad de grasa, sodio y azúcar agregada.  · Compre en la zona exterior de la tienda de comestibles. Esta incluye frutas y vegetales frescos, granos a granel, carnes frescas y productos lácteos frescos.  Cocción  · Utilice métodos de cocción a baja temperatura, como hornear, en lugar de métodos de cocción a alta temperatura, como freír en abundante aceite.  · Cocine con aceites saludables, como el aceite de oliva, canola o girasol.  · Evite cocinar con manteca, crema o carnes con alto contenido de grasa.  Planificación de las comidas  · Consuma las comidas y las colaciones de forma regular, preferentemente a la misma hora todos los días. Evite pasar largos períodos de tiempo sin comer.  · Consuma alimentos ricos en fibra, como frutas frescas, verduras, frijoles y cereales integrales. Consulte al nutricionista sobre cuántas porciones de carbohidratos puede consumir en cada comida.  · Consuma entre 4 y 6 onzas de proteínas magras por día, como carnes magras, pollo, pescado, huevos o tofu. 1 onza equivale a 1 onza de carne, pollo o pescado, 1 huevo, o 1/4 taza de tofu.  · Coma algunos alimentos por día que contengan grasas saludables, como aguacates, frutos secos, semillas y pescado.  Estilo de vida     · Controle su nivel de glucemia con regularidad.  · Haga ejercicio al menos 30 minutos, 5 días o más por semana, o como se lo haya indicado el médico.  · Tome los medicamentos como se lo haya indicado el médico.  · No consuma ningún producto que contenga nicotina o tabaco, como cigarrillos y cigarrillos electrónicos. Si necesita ayuda para dejar de fumar, consulte al médico.  · Trabaje con un asesor o instructor en diabetes para identificar estrategias para controlar el estrés y cualquier desafío emocional y social.  ¿Cuáles son algunas de las preguntas que puedo hacerle a mi médico?  · ¿Es necesario que me reúna con un instructor en diabetes?  · ¿Es necesario que me reúna con un nutricionista?  · ¿A qué número puedo llamar si tengo preguntas?  · ¿Cuáles son los mejores momentos para controlar la glucemia?  Dónde encontrar más información:  · Asociación Americana de la Diabetes (American Diabetes Association): diabetes.org/food-and-fitness/food  · Academia de Nutrición y Dietética (Academy of Nutrition and Dietetics): www.eatright.org/resources/health/diseases-and-conditions/diabetes  · Instituto Nacional de la Diabetes y las Enfermedades Digestivas y Renales (National Institute of Diabetes and Digestive and Kidney Diseases) (Institutos Nacionales de Salud, NIH): www.niddk.nih.gov/health-information/diabetes/overview/diet-eating-physical-activity  Resumen  · Un plan de alimentación saludable   lo ayudará a controlar la glucemia y mantener un estilo de vida saludable.  · Trabajar con un especialista en dietas y nutrición (nutricionista) puede ayudarlo a elaborar el mejor plan de alimentación para usted.  · Tenga en cuenta que los carbohidratos y el alcohol tienen efectos inmediatos en sus niveles de glucemia. Es importante contar los carbohidratos y consumir alcohol con prudencia.  Esta información no tiene como fin reemplazar el consejo del médico. Asegúrese de hacerle al médico cualquier pregunta que tenga.   Document Released: 12/10/2007 Document Revised: 12/23/2016 Document Reviewed: 12/23/2016  Elsevier Interactive Patient Education © 2018 [employer]

## 2018-08-14 LAB — FECAL OCCULT BLOOD, IMMUNOCHEMICAL: Fecal Occult Bld: NEGATIVE

## 2018-08-21 ENCOUNTER — Telehealth (INDEPENDENT_AMBULATORY_CARE_PROVIDER_SITE_OTHER): Payer: Self-pay

## 2018-08-21 NOTE — Telephone Encounter (Signed)
Call placed using pacific interpreter [phone]) voicemail not setup yet. Will call back at a later time. Maryjean Mornempestt S Danne Scardina, CMA

## 2018-08-21 NOTE — Telephone Encounter (Signed)
-----   Message from Loletta Specteroger David Gomez, PA-C sent at 08/17/2018  1:40 PM EST ----- Negative FIT.

## 2018-08-24 ENCOUNTER — Ambulatory Visit: Payer: Self-pay | Attending: Family Medicine

## 2018-08-25 ENCOUNTER — Telehealth (INDEPENDENT_AMBULATORY_CARE_PROVIDER_SITE_OTHER): Payer: Self-pay

## 2018-08-25 NOTE — Telephone Encounter (Signed)
Call placed using pacific interpreter [phone]([phone]) patient aware that FIT is negative. Maryjean Mornempestt S Najai Waszak, CMA

## 2018-08-25 NOTE — Telephone Encounter (Signed)
-----   Message from Roger David Gomez, PA-C sent at 08/17/2018  1:40 PM EST ----- Negative FIT. 

## 2018-09-21 MED FILL — ?ATORVASTATIN 40MG TABLET: 40 | 30 days supply | Qty: 30 | Fill #0

## 2018-09-21 MED FILL — LISINOPRIL 2.5 MG TABLET: 2.5 | 30 days supply | Qty: 30 | Fill #0

## 2018-09-21 MED FILL — TRUEPLUS PEN NDL 31G X 1/4": 31G X 6 MM | 30 days supply | Qty: 100 | Fill #0

## 2018-09-21 MED FILL — ?GLIMEPIRIDE 4 MG TABLET: 4 | 30 days supply | Qty: 30 | Fill #0

## 2018-09-21 MED FILL — TRUEPLUS PEN NDL 31G X 1/4: 31G X 6 MM | 30 days supply | Qty: 100 | Fill #0

## 2018-09-21 MED FILL — OMEGA-3 ETHYL ESTERS 1 GM C: 1 | 120 days supply | Qty: 120 | Fill #0

## 2018-09-22 MED FILL — $LANTUS SOLOSTAR 100 UNITS/: 100 | 85 days supply | Qty: 30 | Fill #1

## 2018-11-09 MED FILL — LISINOPRIL 2.5 MG TABLET: 2.5 | 30 days supply | Qty: 30 | Fill #2

## 2018-11-09 MED FILL — ?ATORVASTATIN 40MG TABLET: 40 | 30 days supply | Qty: 30 | Fill #1

## 2018-11-09 MED FILL — ?GLIMEPIRIDE 4 MG TABLET: 4 | 30 days supply | Qty: 30 | Fill #1

## 2018-11-09 MED FILL — $JANUMET 50-1000 MG TABLET: 50-1000 | 90 days supply | Qty: 180 | Fill #0

## 2018-12-23 MED FILL — LISINOPRIL 2.5 MG TABLET: 2.5 | 30 days supply | Qty: 30 | Fill #3

## 2018-12-23 MED FILL — ?ATORVASTATIN 40MG TABLET: 40 | 30 days supply | Qty: 30 | Fill #2

## 2018-12-23 MED FILL — GLIMEPIRIDE 4 MG TABS: 4 | 30 days supply | Qty: 30 | Fill #2

## 2018-12-28 MED FILL — $LANTUS SOLOSTAR 100 UNITS/: 100 | 85 days supply | Qty: 30 | Fill #2

## 2019-02-02 ENCOUNTER — Ambulatory Visit: Payer: Self-pay | Admitting: Primary Care

## 2019-02-12 MED FILL — LISINOPRIL 2.5 MG TABLET: 2.5 | 30 days supply | Qty: 30 | Fill #4

## 2019-02-12 MED FILL — ?ATORVASTATIN 40MG TABLET: 40 | 30 days supply | Qty: 30 | Fill #3

## 2019-02-12 MED FILL — GLIMEPIRIDE 4 MG TABS: 4 | 30 days supply | Qty: 30 | Fill #3

## 2019-03-17 MED FILL — $JANUMET 50-1000 MG TABLET: 50-1000 | 30 days supply | Qty: 60 | Fill #1

## 2019-03-17 MED FILL — GLIMEPIRIDE 4 MG TABS: 4 | 30 days supply | Qty: 30 | Fill #4

## 2019-03-17 MED FILL — ?ATORVASTATIN 40MG TABLET: 40 | 30 days supply | Qty: 30 | Fill #2

## 2019-03-17 MED FILL — OMEGA-3 ETHYL ESTERS 1 GM C: 1 | 120 days supply | Qty: 120 | Fill #1

## 2019-03-17 MED FILL — LISINOPRIL 2.5 MG TABLET: 2.5 | 30 days supply | Qty: 30 | Fill #5

## 2019-03-25 ENCOUNTER — Other Ambulatory Visit: Payer: Self-pay

## 2019-03-25 ENCOUNTER — Ambulatory Visit (INDEPENDENT_AMBULATORY_CARE_PROVIDER_SITE_OTHER): Payer: Self-pay | Admitting: Primary Care

## 2019-03-25 ENCOUNTER — Encounter (INDEPENDENT_AMBULATORY_CARE_PROVIDER_SITE_OTHER): Payer: Self-pay | Admitting: Primary Care

## 2019-03-25 DIAGNOSIS — E1121 Type 2 diabetes mellitus with diabetic nephropathy: Secondary | ICD-10-CM

## 2019-03-25 DIAGNOSIS — E781 Pure hyperglyceridemia: Secondary | ICD-10-CM

## 2019-03-25 DIAGNOSIS — Z794 Long term (current) use of insulin: Secondary | ICD-10-CM

## 2019-03-25 DIAGNOSIS — Z23 Encounter for immunization: Secondary | ICD-10-CM

## 2019-03-25 DIAGNOSIS — E119 Type 2 diabetes mellitus without complications: Secondary | ICD-10-CM

## 2019-03-25 DIAGNOSIS — E7841 Elevated Lipoprotein(a): Secondary | ICD-10-CM

## 2019-03-25 DIAGNOSIS — J301 Allergic rhinitis due to pollen: Secondary | ICD-10-CM

## 2019-03-25 DIAGNOSIS — R739 Hyperglycemia, unspecified: Secondary | ICD-10-CM

## 2019-03-25 LAB — POCT GLYCOSYLATED HEMOGLOBIN (HGB A1C): Hemoglobin A1C: 12 % — AB (ref 4.0–5.6)

## 2019-03-25 LAB — GLUCOSE, POCT (MANUAL RESULT ENTRY): POC Glucose: 350 mg/dl — AB (ref 70–99)

## 2019-03-25 MED ORDER — GLIMEPIRIDE 4 MG PO TABS: 4.0000 mg | ORAL_TABLET | Freq: Every day | ORAL | 0 refills | Status: DC

## 2019-03-25 MED ORDER — OMEGA-3-ACID ETHYL ESTERS 1 G PO CAPS: 2.0000 g | ORAL_CAPSULE | Freq: Two times a day (BID) | ORAL | 0 refills | Status: DC

## 2019-03-25 MED ORDER — INSULIN GLARGINE 100 UNIT/ML SOLOSTAR PEN: 30.0000 [IU] | PEN_INJECTOR | Freq: Two times a day (BID) | SUBCUTANEOUS | 3 refills | Status: DC

## 2019-03-25 MED ORDER — ATORVASTATIN CALCIUM 40 MG PO TABS: 40.0000 mg | ORAL_TABLET | Freq: Every day | ORAL | 0 refills | Status: DC

## 2019-03-25 MED ORDER — BD PEN NEEDLE MINI U/F 31G X 5 MM MISC: 5 refills | Status: DC

## 2019-03-25 MED ORDER — INSULIN ASPART 100 UNIT/ML ~~LOC~~ SOLN
10.0000 [IU] | Freq: Once | SUBCUTANEOUS | Status: AC
  Administered 2019-03-25: 10 [IU] via SUBCUTANEOUS

## 2019-03-25 MED ORDER — JANUMET 50-1000 MG PO TABS: 1.0000 | ORAL_TABLET | Freq: Two times a day (BID) | ORAL | 5 refills | Status: DC

## 2019-03-25 MED ORDER — EZETIMIBE 10 MG PO TABS: 10.0000 mg | ORAL_TABLET | Freq: Every day | ORAL | 0 refills | Status: DC

## 2019-03-25 MED ORDER — LISINOPRIL 2.5 MG PO TABS: 2.5000 mg | ORAL_TABLET | Freq: Every day | ORAL | 0 refills | Status: DC

## 2019-03-25 MED FILL — $Zetia 10mg tablet: 10 | 30 days supply | Qty: 30 | Fill #0

## 2019-03-25 MED FILL — TRUEPLUS 5-BEVEL PEN NEEDLE: 31G X 5 MM | 30 days supply | Qty: 100 | Fill #0

## 2019-03-25 MED FILL — !LANTUS SOLOSTAR 100UNITS/M: 100 | 25 days supply | Qty: 15 | Fill #0

## 2019-03-25 NOTE — Progress Notes (Signed)
Established Patient Office Visit  Subjective:  Patient ID: Denise Mosley, female    DOB: 1961/09/10  Age: 58 y.o. MRN: [id number]  CC:  Chief Complaint  Patient presents with  . Diabetes    HPI Denise Mosley presents for follow-up with her diabetes, hypertension, and hyperlipidemia.  She has a past history of Bell's palsy.  Last A1c 08/12/2018- 7.3.  This was 7 months ago.  She denies shortness of breath, headaches, chest pain or lower extremity edema. Past Medical History:  Diagnosis Date  . Bell's palsy   . Diabetes mellitus without complication ([place])   . Hyperlipidemia   . Hypertension     Past Surgical History:  Procedure Laterality Date  . CESAREAN SECTION    . CHOLECYSTECTOMY      Family History  Problem Relation Age of Onset  . Diabetes Mother   . Diabetes Father     Social History   Socioeconomic History  . Marital status: Single    Spouse name: Not on file  . Number of children: Not on file  . Years of education: Not on file  . Highest education level: Not on file  Occupational History  . Not on file  Social Needs  . Financial resource strain: Not on file  . Food insecurity    Worry: Not on file    Inability: Not on file  . Transportation needs    Medical: Not on file    Non-medical: Not on file  Tobacco Use  . Smoking status: Former Research scientist (life sciences)  . Smokeless tobacco: Never Used  . Tobacco comment: Quit 4 years ago  Substance and Sexual Activity  . Alcohol use: No  . Drug use: No  . Sexual activity: Not on file  Lifestyle  . Physical activity    Days per week: Not on file    Minutes per session: Not on file  . Stress: Not on file  Relationships  . Social Herbalist on phone: Not on file    Gets together: Not on file    Attends religious service: Not on file    Active member of club or organization: Not on file    Attends meetings of clubs or organizations: Not on file    Relationship status: Not on file  . Intimate partner  violence    Fear of current or ex partner: Not on file    Emotionally abused: Not on file    Physically abused: Not on file    Forced sexual activity: Not on file  Other Topics Concern  . Not on file  Social History Narrative  . Not on file    Outpatient Medications Prior to Visit  Medication Sig Dispense Refill  . atorvastatin (LIPITOR) 40 MG tablet Take 1 tablet (40 mg total) by mouth daily. 30 tablet 5  . Blood Glucose Monitoring Suppl (TRUE METRIX METER) w/Device KIT Use as instructed 1 kit 0  . ezetimibe (ZETIA) 10 MG tablet Take 1 tablet (10 mg total) by mouth daily. 30 tablet 5  . glimepiride (AMARYL) 4 MG tablet Take 1 tablet (4 mg total) by mouth daily before breakfast. 30 tablet 5  . glucose blood (TRUE METRIX BLOOD GLUCOSE TEST) test strip Use as instructed 100 each 12  . Insulin Glargine (LANTUS) 100 UNIT/ML Solostar Pen Inject 35 Units into the skin daily at 10 pm. 4 pen 11  . Insulin Pen Needle (B-D UF III MINI PEN NEEDLES) 31G X 5 MM MISC Use as  instructed 100 each 5  . lisinopril (PRINIVIL,ZESTRIL) 2.5 MG tablet Take 1 tablet (2.5 mg total) by mouth daily. 30 tablet 5  . omega-3 acid ethyl esters (LOVAZA) 1 g capsule Take 2 capsules (2 g total) by mouth 2 (two) times daily. 120 capsule 5  . sitaGLIPtin-metformin (JANUMET) 50-1000 MG tablet Take 1 tablet by mouth 2 (two) times daily with a meal. 60 tablet 5  . acetaminophen (TYLENOL) 500 MG tablet Take 1,000 mg by mouth every 6 (six) hours as needed for mild pain.     . blood glucose meter kit and supplies Dispense based on patient and insurance preference. Use up to four times daily as directed. (E11.8). 1 each 0   No facility-administered medications prior to visit.     No Known Allergies  ROS Review of Systems  HENT: Positive for postnasal drip.   Allergic/Immunologic: Positive for environmental allergies.  All other systems reviewed and are negative.     Objective:    Physical Exam  Constitutional: She is  oriented to person, place, and time. She appears well-developed and well-nourished.  HENT:  Head: Normocephalic.  Eyes: EOM are normal.  Neck: Neck supple.  Cardiovascular: Normal rate and regular rhythm.  Pulmonary/Chest: Effort normal and breath sounds normal.  Abdominal: Soft. Bowel sounds are normal. She exhibits distension.  Musculoskeletal: Normal range of motion.  Neurological: She is oriented to person, place, and time.  Skin: Skin is warm and dry.  Psychiatric: She has a normal mood and affect.  Sensory exam of the foot is normal, tested with the monofilament. Good pulses, no lesions or ulcers, good peripheral pulses.  BP 120/78 (BP Location: Right Arm, Patient Position: Sitting, Cuff Size: Large)   Pulse 67   Temp 98.4 F (36.9 C) (Tympanic)   Ht 4' 8"  (1.422 m)   Wt 191 lb 9.6 oz (86.9 kg)   SpO2 94%   BMI 42.96 kg/m  Wt Readings from Last 3 Encounters:  03/25/19 191 lb 9.6 oz (86.9 kg)  08/12/18 203 lb 9.6 oz (92.4 kg)  05/14/18 200 lb 6.4 oz (90.9 kg)     Health Maintenance Due  Topic Date Due  . PNEUMOCOCCAL POLYSACCHARIDE VACCINE AGE 40-64 HIGH RISK  04/15/1965  . OPHTHALMOLOGY EXAM  04/15/1973  . Fecal DNA (Cologuard)  04/15/2013  . MAMMOGRAM  04/29/2016  . FOOT EXAM  08/14/2018  . HEMOGLOBIN A1C  02/10/2019    There are no preventive care reminders to display for this patient.  Lab Results  Component Value Date   TSH 2.610 08/14/2017   Lab Results  Component Value Date   WBC 5.1 08/14/2017   HGB 15.8 08/14/2017   HCT 42.2 08/14/2017   MCV 86 08/14/2017   PLT 237 08/14/2017   Lab Results  Component Value Date   NA 141 05/14/2018   K 4.0 05/14/2018   CO2 25 05/14/2018   GLUCOSE 114 (H) 05/14/2018   BUN 11 05/14/2018   CREATININE 0.61 05/14/2018   BILITOT 0.2 05/14/2018   ALKPHOS 182 (H) 05/14/2018   AST 20 05/14/2018   ALT 27 05/14/2018   PROT 7.7 05/14/2018   ALBUMIN 4.2 05/14/2018   CALCIUM 9.4 05/14/2018   ANIONGAP 11 01/23/2017    Lab Results  Component Value Date   CHOL 139 05/14/2018   Lab Results  Component Value Date   HDL 31 (L) 05/14/2018   Lab Results  Component Value Date   LDLCALC 36 05/14/2018   Lab Results  Component Value  Date   TRIG 359 (H) 05/14/2018   Lab Results  Component Value Date   CHOLHDL 4.5 (H) 05/14/2018   Lab Results  Component Value Date   HGBA1C 12.0 (A) 03/25/2019      Assessment & Plan:   Problem List Items Addressed This Visit    Diabetes mellitus type 2, controlled ([place]) - Primary   Relevant Orders   Glucose (CBG) (Completed)   HgB A1c (Completed)   Fecal occult blood, imunochemical([facility])   Pneumococcal polysaccharide vaccine 23-valent greater than or equal to 2yo subcutaneous/IM    Denise Mosley was seen today for diabetes.  Diagnoses and all orders for this visit:  Severe obesity (BMI >= 40) ([place]) We discussed the need to lose weight and healthier eating habits .She has metabolic syndrome which includes at least 3 of the 5 medical conditions abdominal obesity high blood pressure elevated blood sugars high serum triglycerides and low serum high density lipoprotein. Type 2 diabetes mellitus without complication, with long-term current use of insulin (HCC) -     glimepiride (AMARYL) 4 MG tablet; Take 1 tablet (4 mg total) by mouth daily before breakfast. -     Insulin Glargine (LANTUS) 100 UNIT/ML Solostar Pen; Inject 30 Units into the skin 2 (two) times daily. -     lisinopril (ZESTRIL) 2.5 MG tablet; Take 1 tablet (2.5 mg total) by mouth daily. -     sitaGLIPtin-metformin (JANUMET) 50-1000 MG tablet; Take 1 tablet by mouth 2 (two) times daily with a meal. -     Insulin Pen Need  ADA recommends the following therapeutic goals for glycemic control related to A1c measurements: Goal of therapy: Less than 6.5 hemoglobin A1c.  Reference clinical practice recommendations. Foods that are high in carbohydrates are the following rice, potatoes, breads, sugars, and  pastas.  Reduction in the intake (eating) will assist in lowering your blood sugars. -     Glucose (CBG) -     HgB A1c -     Fecal occult blood, imunochemical([facility]); Future -     Pneumococcal polysaccharide vaccine 23-valent greater than or equal to 2yo subcutaneous/IM  Elevated lipoprotein(a) -     atorvastatin (LIPITOR) 40 MG tablet; Take 1 tablet (40 mg total) by mouth daily. -     ezetimibe (ZETIA) 10 MG tablet; Take 1 tablet (10 mg total) by mouth daily.  le (B-D UF III MINI PEN NEEDLES) 31G X 5 MM MISC; Use as instructed  Hypertriglyceridemia  Healthy lifestyle diet of fruits vegetables fish nuts whole grains and low saturated fat . Foods high in cholesterol or liver, fatty meats,cheese, butter avocados, nuts and seeds, chocolate and fried foods.    -     omega-3 acid ethyl esters (LOVAZA) 1 g capsule; Take 2 capsules (2 g total) by mouth 2 (two) times daily.  Non-seasonal allergic rhinitis due to pollen Will treat with antihistamines probably due to barometric pressure and environmental surroundings.  Examples are carpet, pets, and mold/.     No orders of the defined types were placed in this encounter.   Follow-up: No follow-ups on file.    [name], NP

## 2019-03-25 NOTE — Patient Instructions (Signed)
Diabetes mellitus y actividad física °Diabetes Mellitus and Exercise °Hacer actividad física habitualmente es importante para el estado de salud general, en especial si tiene diabetes (diabetes mellitus). La actividad física no solo se reduce a bajar de peso. Aporta muchos beneficios para la salud, como aumento de la fuerza muscular y la densidad ósea, y reducción de las grasas corporales y el estrés. Esto mejora el estado físico, la flexibilidad y la resistencia, y todo ello redunda en un mejor estado de salud general. °La actividad física tiene beneficios adicionales para los diabéticos, entre ellos: °· Disminuye el apetito. °· Ayuda a bajar y mantener la glucemia bajo control. °· Baja la presión arterial. °· Ayuda a controlar las cantidades de sustancias grasas (lípidos) en la sangre, como el colesterol y los triglicéridos. °· Mejora la respuesta del cuerpo a la insulina (optimización de la sensibilidad a la insulina). °· Reduce la cantidad de insulina que el cuerpo necesita. °· Reduce el riesgo de sufrir cardiopatía coronaria de la siguiente forma: °? Baja los niveles de colesterol y triglicéridos. °? Aumenta los niveles de colesterol bueno. °? Disminuye la glucemia. °¿Cuál es mi plan de actividad? °El médico o un educador para la diabetes certificado pueden ayudarlo a elaborar un plan respecto del tipo y de la frecuencia de actividad física (plan de actividades) adecuado para usted. Asegúrese de lo siguiente: °· Haga por lo menos 150 minutos semanales de ejercicios de intensidad moderada o vigorosa. Estos podrían ser caminatas dinámicas, ciclismo o gimnasia acuática. °? Haga ejercicios de elongación y de fortalecimiento, como yoga o levantamiento de pesas, por lo menos 2 veces por semana. °? Reparta la actividad en al menos 3 días de la semana. °· Haga algún tipo de actividad física todos los días. °? No deje pasar más de 2 días seguidos sin hacer algún tipo de actividad física. °? Evite permanecer inactivo  durante más de 30 minutos seguidos. Tómese descansos frecuentes para caminar o estirarse. °· Elija un tipo de ejercicio o de actividad que disfrute y establezca objetivos realistas. °· Comience lentamente y aumente de manera gradual la intensidad del ejercicio con el correr del tiempo. °¿Qué debo saber acerca del control de la diabetes? ° °· Contrólese la glucemia antes y después de ejercitarse. °? Si la glucemia es de 240 mg/dl (13,3 mmol/l) o más antes de comenzar a hacer actividad física, controle la orina para detectar la presencia de cetonas. Si tiene cetonas en la orina, no haga ejercicio hasta que la glucemia se normalice. °? Si la glucemia es de 100 mg/dl (5.6 mmol/l) o menos, tome una colación que contenga entre 15 y 20 gramos de carbohidratos. Controle la glucemia 15 minutos después de la colación para asegurarse de que el nivel esté por encima de 100 mg/dl (5.6 mmol/l) antes de comenzar a hacer actividad física. °· Conozca los síntomas de la glucemia baja (hipoglucemia) y aprenda cómo tratarla. El riesgo de tener hipoglucemia aumenta durante y después de hacer actividad física. Los síntomas frecuentes de hipoglucemia pueden incluir los siguientes: °? Hambre. °? Ansiedad. °? Sudoración y piel húmeda. °? Confusión. °? Mareos o sensación de desvanecimiento. °? Aumento de la frecuencia cardíaca o palpitaciones. °? Visión borrosa. °? Hormigueo o adormecimiento alrededor de la boca, los labios o la lengua. °? Estremecimientos y temblores. °? Irritabilidad. °· Tenga una colación de carbohidratos de acción rápida disponible antes, durante y después de ejercitarse, a fin de evitar o tratar la hipoglucemia. °· Evite inyectarse insulina en las zonas del cuerpo que ejercitará. Por ejemplo, evite inyectarse insulina en: °? Los brazos,   si juega al tenis. °? Las piernas, si corre. °· Lleve registros de sus hábitos de actividad física. Esto puede ayudarlos a usted y al médico a adaptar el plan de control de la diabetes  según sea necesario. Escriba los siguientes datos: °? Los alimentos que consume antes y después de hacer actividad física. °? Los niveles de glucosa en la sangre antes y después de hacer ejericios. °? El tipo y cantidad de actividad física que realiza. °? Cuando se prevé que la insulina alcance su valor máximo, si usa insulina. No haga actividad física en los momentos en que insulina alcanza su valor máximo. °· Cuando comience un ejercicio o una actividad nuevos, trabaje con el médico para asegurarse de que la actividad sea segura para usted y para ajustar la insulina, los medicamentos o la ingesta de alimentos según sea necesario. °· Beba gran cantidad de agua mientras hace ejercicio para evitar la deshidratación o los golpes de calor. Beba suficiente líquido como para mantener la orina clara o de color amarillo pálido. °Resumen °· Hacer actividad física habitualmente es importante para el estado de salud general, en especial si tiene diabetes (diabetes mellitus). °· La actividad física aporta muchos beneficios para la salud, como aumentar la fuerza muscular y la densidad ósea, y reducir las grasas corporales y el estrés. °· El médico o un educador para la diabetes certificado pueden ayudarlo a elaborar un plan respecto del tipo y de la frecuencia de actividad física (plan de actividades) adecuado para usted. °· Cuando comience un ejercicio o una actividad nuevos, trabaje con el médico para asegurarse de que la actividad sea segura para usted y para ajustar la insulina, los medicamentos o la ingesta de alimentos según sea necesario. °Esta información no tiene como fin reemplazar el consejo del médico. Asegúrese de hacerle al médico cualquier pregunta que tenga. °Document Released: 09/22/2007 Document Revised: 06/30/2017 Document Reviewed: 02/12/2016 °Elsevier Patient Education © 2020 Else[employer] Inc. ° °

## 2019-03-29 ENCOUNTER — Ambulatory Visit: Payer: Self-pay | Admitting: Family Medicine

## 2019-05-11 MED FILL — !LANTUS SOLOSTAR 100UNITS/M: 100 | 25 days supply | Qty: 15 | Fill #1

## 2019-05-11 MED FILL — ATORVASTATIN CALCIUM 40 MG: 40 | 30 days supply | Qty: 30 | Fill #3

## 2019-05-11 MED FILL — GLIMEPIRIDE 4 MG TABS: 4 | 30 days supply | Qty: 30 | Fill #5

## 2019-05-11 MED FILL — LISINOPRIL 2.5 MG TABLET: 2.5 | 90 days supply | Qty: 90 | Fill #0

## 2019-05-11 MED FILL — $Zetia 10mg tablet: 10 | 30 days supply | Qty: 30 | Fill #1

## 2019-06-17 MED FILL — $JANUMET 50-1000 MG TABLET: 50-1000 | 90 days supply | Qty: 180 | Fill #0

## 2019-06-17 MED FILL — $Zetia 10mg tablet: 10 | 30 days supply | Qty: 30 | Fill #2

## 2019-06-17 MED FILL — GLIMEPIRIDE 4 MG TABS: 4 | 30 days supply | Qty: 30 | Fill #0

## 2019-06-17 MED FILL — OMEGA-3 ETHYL ESTERS 1 GM C: 1 | 30 days supply | Qty: 120 | Fill #0

## 2019-06-25 ENCOUNTER — Encounter (INDEPENDENT_AMBULATORY_CARE_PROVIDER_SITE_OTHER): Payer: Self-pay | Admitting: Primary Care

## 2019-06-25 ENCOUNTER — Ambulatory Visit (INDEPENDENT_AMBULATORY_CARE_PROVIDER_SITE_OTHER): Payer: Self-pay | Admitting: Primary Care

## 2019-06-25 ENCOUNTER — Other Ambulatory Visit: Payer: Self-pay

## 2019-06-25 VITALS — BP 124/80 | HR 70 | Temp 97.6°F | Ht <= 58 in | Wt 183.6 lb

## 2019-06-25 DIAGNOSIS — IMO0002 Reserved for concepts with insufficient information to code with codable children: Secondary | ICD-10-CM

## 2019-06-25 DIAGNOSIS — E118 Type 2 diabetes mellitus with unspecified complications: Secondary | ICD-10-CM

## 2019-06-25 DIAGNOSIS — Z23 Encounter for immunization: Secondary | ICD-10-CM

## 2019-06-25 DIAGNOSIS — E781 Pure hyperglyceridemia: Secondary | ICD-10-CM

## 2019-06-25 DIAGNOSIS — E1165 Type 2 diabetes mellitus with hyperglycemia: Secondary | ICD-10-CM

## 2019-06-25 DIAGNOSIS — E119 Type 2 diabetes mellitus without complications: Secondary | ICD-10-CM

## 2019-06-25 LAB — POCT GLYCOSYLATED HEMOGLOBIN (HGB A1C): Hemoglobin A1C: 11.5 % — AB (ref 4.0–5.6)

## 2019-06-25 LAB — GLUCOSE, POCT (MANUAL RESULT ENTRY): POC Glucose: 201 mg/dl — AB (ref 70–99)

## 2019-06-25 NOTE — Progress Notes (Signed)
Pt has only eaten a bowl of oatmeal

## 2019-06-25 NOTE — Progress Notes (Signed)
Established Patient Office Visit  Subjective:  Patient ID: Denise Mosley, female    DOB: [birth year]  Age: 58 y.o. MRN: [id number]  CC:  Chief Complaint  Patient presents with  . Diabetes   11.5 HPI Denise Mosley presents for management of type 2 diabetes remains non complaint. She is able to state medication she is taking her diet is the main cause of uncontrol diabetes and unsure she is taking her medications as prescribed and how she stated. She denies any complaints or problems.  Past Medical History:  Diagnosis Date  . Bell's palsy   . Diabetes mellitus without complication ([place])   . Hyperlipidemia   . Hypertension     Past Surgical History:  Procedure Laterality Date  . CESAREAN SECTION    . CHOLECYSTECTOMY      Family History  Problem Relation Age of Onset  . Diabetes Mother   . Diabetes Father     Social History   Socioeconomic History  . Marital status: Single    Spouse name: Not on file  . Number of children: Not on file  . Years of education: Not on file  . Highest education level: Not on file  Occupational History  . Not on file  Social Needs  . Financial resource strain: Not on file  . Food insecurity    Worry: Not on file    Inability: Not on file  . Transportation needs    Medical: Not on file    Non-medical: Not on file  Tobacco Use  . Smoking status: Former Research scientist (life sciences)  . Smokeless tobacco: Never Used  . Tobacco comment: Quit 4 years ago  Substance and Sexual Activity  . Alcohol use: No  . Drug use: No  . Sexual activity: Not on file  Lifestyle  . Physical activity    Days per week: Not on file    Minutes per session: Not on file  . Stress: Not on file  Relationships  . Social Herbalist on phone: Not on file    Gets together: Not on file    Attends religious service: Not on file    Active member of club or organization: Not on file    Attends meetings of clubs or organizations: Not on file    Relationship status:  Not on file  . Intimate partner violence    Fear of current or ex partner: Not on file    Emotionally abused: Not on file    Physically abused: Not on file    Forced sexual activity: Not on file  Other Topics Concern  . Not on file  Social History Narrative  . Not on file    Outpatient Medications Prior to Visit  Medication Sig Dispense Refill  . atorvastatin (LIPITOR) 40 MG tablet Take 1 tablet (40 mg total) by mouth daily. 90 tablet 0  . ezetimibe (ZETIA) 10 MG tablet Take 1 tablet (10 mg total) by mouth daily. 90 tablet 0  . glimepiride (AMARYL) 4 MG tablet Take 1 tablet (4 mg total) by mouth daily before breakfast. 90 tablet 0  . Insulin Glargine (LANTUS) 100 UNIT/ML Solostar Pen Inject 30 Units into the skin 2 (two) times daily. 4 pen 3  . Insulin Pen Needle (B-D UF III MINI PEN NEEDLES) 31G X 5 MM MISC Use as instructed 100 each 5  . omega-3 acid ethyl esters (LOVAZA) 1 g capsule Take 2 capsules (2 g total) by mouth 2 (two) times daily. 120 capsule  0  . sitaGLIPtin-metformin (JANUMET) 50-1000 MG tablet Take 1 tablet by mouth 2 (two) times daily with a meal. 180 tablet 5  . acetaminophen (TYLENOL) 500 MG tablet Take 1,000 mg by mouth every 6 (six) hours as needed for mild pain.     . blood glucose meter kit and supplies Dispense based on patient and insurance preference. Use up to four times daily as directed. (E11.8). 1 each 0  . Blood Glucose Monitoring Suppl (TRUE METRIX METER) w/Device KIT Use as instructed 1 kit 0  . glucose blood (TRUE METRIX BLOOD GLUCOSE TEST) test strip Use as instructed 100 each 12  . lisinopril (ZESTRIL) 2.5 MG tablet Take 1 tablet (2.5 mg total) by mouth daily. (Patient not taking: Reported on 06/25/2019) 90 tablet 0   No facility-administered medications prior to visit.     No Known Allergies  ROS Review of Systems  All other systems reviewed and are negative.     Objective:    Physical Exam  Constitutional: She is oriented to person, place,  and time. She appears well-developed and well-nourished.  Morbid obesity  HENT:  Head: Normocephalic.  Neck: Normal range of motion. Neck supple.  Cardiovascular: Normal rate and regular rhythm.  Pulmonary/Chest: Effort normal and breath sounds normal.  Abdominal: Soft. Bowel sounds are normal. She exhibits no distension.  Musculoskeletal: Normal range of motion.  Neurological: She is oriented to person, place, and time.  Skin: Skin is warm.  Psychiatric: She has a normal mood and affect.    BP 124/80 (BP Location: Right Arm, Patient Position: Sitting, Cuff Size: Large)   Pulse 70   Temp 97.6 F (36.4 C) (Temporal)   Ht 4' 8"  (1.422 m)   Wt 183 lb 9.6 oz (83.3 kg)   SpO2 96%   BMI 41.16 kg/m  Wt Readings from Last 3 Encounters:  06/25/19 183 lb 9.6 oz (83.3 kg)  03/25/19 191 lb 9.6 oz (86.9 kg)  08/12/18 203 lb 9.6 oz (92.4 kg)     Health Maintenance Due  Topic Date Due  . OPHTHALMOLOGY EXAM  04/15/1973  . Fecal DNA (Cologuard)  04/15/2013  . MAMMOGRAM  04/29/2016    There are no preventive care reminders to display for this patient.  Lab Results  Component Value Date   TSH 2.610 08/14/2017   Lab Results  Component Value Date   WBC 7.0 06/25/2019   HGB 13.9 06/25/2019   HCT 42.2 06/25/2019   MCV 86 06/25/2019   PLT 203 06/25/2019   Lab Results  Component Value Date   NA 136 06/25/2019   K 4.2 06/25/2019   CO2 21 06/25/2019   GLUCOSE 190 (H) 06/25/2019   BUN 12 06/25/2019   CREATININE 0.63 06/25/2019   BILITOT 0.3 06/25/2019   ALKPHOS 159 (H) 06/25/2019   AST 50 (H) 06/25/2019   ALT 46 (H) 06/25/2019   PROT 7.0 06/25/2019   ALBUMIN 4.1 06/25/2019   CALCIUM 9.7 06/25/2019   ANIONGAP 11 01/23/2017   Lab Results  Component Value Date   CHOL 136 06/25/2019   Lab Results  Component Value Date   HDL 30 (L) 06/25/2019   Lab Results  Component Value Date   LDLCALC 38 06/25/2019   Lab Results  Component Value Date   TRIG 478 (H) 06/25/2019    Lab Results  Component Value Date   CHOLHDL 4.5 (H) 06/25/2019   Lab Results  Component Value Date   HGBA1C 11.5 (A) 06/25/2019  Assessment & Plan:  Denise Mosley was seen today for diabetes.  Diagnoses and all orders for this visit:  Uncontrolled type 2 diabetes mellitus with complication ([facility]) -     HgB A1c -     Glucose (CBG) -     CMP14+EGFR -     CBC with Differential  Hypertriglyceridemia currently on atorvastin 33m recommend to take at night, decrease y fatty foods, red meat, cheese, milk and increase fiber like whole grains and veggies. You can also add a fiber supplement like Metamucil or Benefiber.  -     Lipid Panel  Encounter for diabetic foot exam ([place] Sensory exam of the foot is normal, tested with the monofilament. Good pulses, no lesions or ulcers, good peripheral pulses.  Need for immunization against influenza -     Flu Vaccine QUAD 36+ mos IM CDC recommends influenza vaccine yearly.  Flu intercanthus respiratory illness caused by influenza virus that affects the nose throat and sometimes the lungs. Can help eliminate differentials pneumonia, bronchitis or COVID,  No orders of the defined types were placed in this encounter.   Follow-up: Return in about 3 months (around 09/25/2019) [facility]    MKerin Perna NP

## 2019-06-25 NOTE — Patient Instructions (Signed)
Diabetes mellitus y actividad física °Diabetes Mellitus and Exercise °Hacer actividad física habitualmente es importante para el estado de salud general, en especial si tiene diabetes (diabetes mellitus). La actividad física no solo se reduce a bajar de peso. Aporta muchos beneficios para la salud, como aumento de la fuerza muscular y la densidad ósea, y reducción de las grasas corporales y el estrés. Esto mejora el estado físico, la flexibilidad y la resistencia, y todo ello redunda en un mejor estado de salud general. °La actividad física tiene beneficios adicionales para los diabéticos, entre ellos: °· Disminuye el apetito. °· Ayuda a bajar y mantener la glucemia bajo control. °· Baja la presión arterial. °· Ayuda a controlar las cantidades de sustancias grasas (lípidos) en la sangre, como el colesterol y los triglicéridos. °· Mejora la respuesta del cuerpo a la insulina (optimización de la sensibilidad a la insulina). °· Reduce la cantidad de insulina que el cuerpo necesita. °· Reduce el riesgo de sufrir cardiopatía coronaria de la siguiente forma: °? Baja los niveles de colesterol y triglicéridos. °? Aumenta los niveles de colesterol bueno. °? Disminuye la glucemia. °¿Cuál es mi plan de actividad? °El médico o un educador para la diabetes certificado pueden ayudarlo a elaborar un plan respecto del tipo y de la frecuencia de actividad física (plan de actividades) adecuado para usted. Asegúrese de lo siguiente: °· Haga por lo menos 150 minutos semanales de ejercicios de intensidad moderada o vigorosa. Estos podrían ser caminatas dinámicas, ciclismo o gimnasia acuática. °? Haga ejercicios de elongación y de fortalecimiento, como yoga o levantamiento de pesas, por lo menos 2 veces por semana. °? Reparta la actividad en al menos 3 días de la semana. °· Haga algún tipo de actividad física todos los días. °? No deje pasar más de 2 días seguidos sin hacer algún tipo de actividad física. °? Evite permanecer inactivo  durante más de 30 minutos seguidos. Tómese descansos frecuentes para caminar o estirarse. °· Elija un tipo de ejercicio o de actividad que disfrute y establezca objetivos realistas. °· Comience lentamente y aumente de manera gradual la intensidad del ejercicio con el correr del tiempo. °¿Qué debo saber acerca del control de la diabetes? ° °· Contrólese la glucemia antes y después de ejercitarse. °? Si la glucemia es de 240 mg/dl (13,3 mmol/l) o más antes de comenzar a hacer actividad física, controle la orina para detectar la presencia de cetonas. Si tiene cetonas en la orina, no haga ejercicio hasta que la glucemia se normalice. °? Si la glucemia es de 100 mg/dl (5.6 mmol/l) o menos, tome una colación que contenga entre 15 y 20 gramos de carbohidratos. Controle la glucemia 15 minutos después de la colación para asegurarse de que el nivel esté por encima de 100 mg/dl (5.6 mmol/l) antes de comenzar a hacer actividad física. °· Conozca los síntomas de la glucemia baja (hipoglucemia) y aprenda cómo tratarla. El riesgo de tener hipoglucemia aumenta durante y después de hacer actividad física. Los síntomas frecuentes de hipoglucemia pueden incluir los siguientes: °? Hambre. °? Ansiedad. °? Sudoración y piel húmeda. °? Confusión. °? Mareos o sensación de desvanecimiento. °? Aumento de la frecuencia cardíaca o palpitaciones. °? Visión borrosa. °? Hormigueo o adormecimiento alrededor de la boca, los labios o la lengua. °? Estremecimientos y temblores. °? Irritabilidad. °· Tenga una colación de carbohidratos de acción rápida disponible antes, durante y después de ejercitarse, a fin de evitar o tratar la hipoglucemia. °· Evite inyectarse insulina en las zonas del cuerpo que ejercitará. Por ejemplo, evite inyectarse insulina en: °? Los brazos,   si juega al tenis. °? Las piernas, si corre. °· Lleve registros de sus hábitos de actividad física. Esto puede ayudarlos a usted y al médico a adaptar el plan de control de la diabetes  según sea necesario. Escriba los siguientes datos: °? Los alimentos que consume antes y después de hacer actividad física. °? Los niveles de glucosa en la sangre antes y después de hacer ejericios. °? El tipo y cantidad de actividad física que realiza. °? Cuando se prevé que la insulina alcance su valor máximo, si usa insulina. No haga actividad física en los momentos en que insulina alcanza su valor máximo. °· Cuando comience un ejercicio o una actividad nuevos, trabaje con el médico para asegurarse de que la actividad sea segura para usted y para ajustar la insulina, los medicamentos o la ingesta de alimentos según sea necesario. °· Beba gran cantidad de agua mientras hace ejercicio para evitar la deshidratación o los golpes de calor. Beba suficiente líquido como para mantener la orina clara o de color amarillo pálido. °Resumen °· Hacer actividad física habitualmente es importante para el estado de salud general, en especial si tiene diabetes (diabetes mellitus). °· La actividad física aporta muchos beneficios para la salud, como aumentar la fuerza muscular y la densidad ósea, y reducir las grasas corporales y el estrés. °· El médico o un educador para la diabetes certificado pueden ayudarlo a elaborar un plan respecto del tipo y de la frecuencia de actividad física (plan de actividades) adecuado para usted. °· Cuando comience un ejercicio o una actividad nuevos, trabaje con el médico para asegurarse de que la actividad sea segura para usted y para ajustar la insulina, los medicamentos o la ingesta de alimentos según sea necesario. °Esta información no tiene como fin reemplazar el consejo del médico. Asegúrese de hacerle al médico cualquier pregunta que tenga. °Document Released: 09/22/2007 Document Revised: 06/30/2017 Document Reviewed: 02/12/2016 °Elsevier Patient Education © 2020 Else[employer] Inc. ° °

## 2019-06-26 LAB — LIPID PANEL
Chol/HDL Ratio: 4.5 ratio — ABNORMAL HIGH (ref 0.0–4.4)
Cholesterol, Total: 136 mg/dL (ref 100–199)
HDL: 30 mg/dL — ABNORMAL LOW (ref >39)
LDL Chol Calc (NIH): 38 mg/dL (ref 0–99)
Triglycerides: 478 mg/dL — ABNORMAL HIGH (ref 0–149)
VLDL Cholesterol Cal: 68 mg/dL — ABNORMAL HIGH (ref 5–40)

## 2019-06-26 LAB — CBC WITH DIFFERENTIAL/PLATELET
Basophils Absolute: 0 10*3/uL (ref 0.0–0.2)
Basos: 0 %
EOS (ABSOLUTE): 0.3 10*3/uL (ref 0.0–0.4)
Eos: 4 %
Hematocrit: 42.2 % (ref 34.0–46.6)
Hemoglobin: 13.9 g/dL (ref 11.1–15.9)
Immature Grans (Abs): 0 10*3/uL (ref 0.0–0.1)
Immature Granulocytes: 0 %
Lymphocytes Absolute: 2.7 10*3/uL (ref 0.7–3.1)
Lymphs: 38 %
MCH: 28.4 pg (ref 26.6–33.0)
MCHC: 32.9 g/dL (ref 31.5–35.7)
MCV: 86 fL (ref 79–97)
Monocytes Absolute: 0.4 10*3/uL (ref 0.1–0.9)
Monocytes: 6 %
Neutrophils Absolute: 3.5 10*3/uL (ref 1.4–7.0)
Neutrophils: 52 %
Platelets: 203 10*3/uL (ref 150–450)
RBC: 4.89 x10E6/uL (ref 3.77–5.28)
RDW: 13.4 % (ref 11.7–15.4)
WBC: 7 10*3/uL (ref 3.4–10.8)

## 2019-06-26 LAB — CMP14+EGFR
ALT: 46 IU/L — ABNORMAL HIGH (ref 0–32)
AST: 50 IU/L — ABNORMAL HIGH (ref 0–40)
Albumin/Globulin Ratio: 1.4 (ref 1.2–2.2)
Albumin: 4.1 g/dL (ref 3.8–4.9)
Alkaline Phosphatase: 159 IU/L — ABNORMAL HIGH (ref 39–117)
BUN/Creatinine Ratio: 19 (ref 9–23)
BUN: 12 mg/dL (ref 6–24)
Bilirubin Total: 0.3 mg/dL (ref 0.0–1.2)
CO2: 21 mmol/L (ref 20–29)
Calcium: 9.7 mg/dL (ref 8.7–10.2)
Chloride: 103 mmol/L (ref 96–106)
Creatinine, Ser: 0.63 mg/dL (ref 0.57–1.00)
GFR calc Af Amer: 116 mL/min/{1.73_m2} (ref >59)
GFR calc non Af Amer: 101 mL/min/{1.73_m2} (ref >59)
Globulin, Total: 2.9 g/dL (ref 1.5–4.5)
Glucose: 190 mg/dL — ABNORMAL HIGH (ref 65–99)
Potassium: 4.2 mmol/L (ref 3.5–5.2)
Sodium: 136 mmol/L (ref 134–144)
Total Protein: 7 g/dL (ref 6.0–8.5)

## 2019-06-27 ENCOUNTER — Other Ambulatory Visit (INDEPENDENT_AMBULATORY_CARE_PROVIDER_SITE_OTHER): Payer: Self-pay | Admitting: Primary Care

## 2019-06-27 DIAGNOSIS — E7841 Elevated Lipoprotein(a): Secondary | ICD-10-CM

## 2019-06-27 DIAGNOSIS — E781 Pure hyperglyceridemia: Secondary | ICD-10-CM

## 2019-06-27 MED ORDER — OMEGA-3-ACID ETHYL ESTERS 1 G PO CAPS: 2.0000 g | ORAL_CAPSULE | Freq: Two times a day (BID) | ORAL | 0 refills | Status: DC

## 2019-06-27 MED ORDER — EZETIMIBE 10 MG PO TABS: 10.0000 mg | ORAL_TABLET | Freq: Every day | ORAL | 0 refills | Status: DC

## 2019-07-01 ENCOUNTER — Ambulatory Visit (INDEPENDENT_AMBULATORY_CARE_PROVIDER_SITE_OTHER): Payer: Self-pay | Admitting: Primary Care

## 2019-07-02 ENCOUNTER — Telehealth (INDEPENDENT_AMBULATORY_CARE_PROVIDER_SITE_OTHER): Payer: Self-pay

## 2019-07-02 NOTE — Telephone Encounter (Signed)
Call placed using pacific interpreter [phone]) patient is aware that she is not taking her cholesterol medication. Cholesterol remains elevated. Advised patient to pick up cholesterol medication from pharmacy and take daily. Patient also advised to decrease fatty food, red meat, cheese and milk and to increase fiber with whole grains and veggies. Patient aware that liver enzymes are elevated. She does not take tylenol or drink alcohol. She expressed understanding of results. Nat Christen, CMA

## 2019-07-02 NOTE — Telephone Encounter (Signed)
-----   Message from [name], NP sent at 06/27/2019  8:20 PM EDT ----- Clearly she not taking her cholesterol medication.Decrease you fatty foods, red meat, cheese, milk and increase fiber like whole grains and veggies. Refilled cholesterol medication. Liver enzymes are elevated will follow unclear if she drinks alcohol or takes tylenol. If both stop

## 2019-07-06 MED FILL — ?ATORVASTATIN 40MG TABLET: 40 | 90 days supply | Qty: 90 | Fill #0

## 2019-07-06 MED FILL — !LANTUS SOLOSTAR 100UNITS/M: 100 | 25 days supply | Qty: 15 | Fill #2

## 2019-07-09 ENCOUNTER — Other Ambulatory Visit: Payer: Self-pay

## 2019-07-09 ENCOUNTER — Encounter: Payer: Self-pay | Admitting: Pharmacist

## 2019-07-09 ENCOUNTER — Ambulatory Visit: Payer: Self-pay | Attending: Family Medicine | Admitting: Pharmacist

## 2019-07-09 DIAGNOSIS — E118 Type 2 diabetes mellitus with unspecified complications: Secondary | ICD-10-CM

## 2019-07-09 DIAGNOSIS — E1165 Type 2 diabetes mellitus with hyperglycemia: Secondary | ICD-10-CM

## 2019-07-09 DIAGNOSIS — IMO0002 Reserved for concepts with insufficient information to code with codable children: Secondary | ICD-10-CM

## 2019-07-09 LAB — GLUCOSE, POCT (MANUAL RESULT ENTRY): POC Glucose: 189 mg/dl — AB (ref 70–99)

## 2019-07-09 NOTE — Patient Instructions (Signed)
Gracias por venir a verme hoy. Por favor haga lo siguiente:  1. Contine con todos los medicamentos. Hoy no vamos a Teacher, English as a foreign language. 2. Contine controlando el azcar en sangre en casa. 3. Contine haciendo los cambios de estilo de vida que discutimos juntos durante nuestra visita. La dieta y el ejercicio juegan un papel importante en la mejora de los niveles de Museum/gallery exhibitions officer. 4. Haz un seguimiento conmigo en 1 mes.    Thank you for coming to see me today. Please do the following:  1. Continue all medications. We are not making changes today.  2. Continue checking blood sugars at home.  3. Continue making the lifestyle changes we've discussed together during our visit. Diet and exercise play a significant role in improving your blood sugars.  4. Follow-up with me in 1 month.

## 2019-07-09 NOTE — Progress Notes (Signed)
    S:    PCP: Sharyn Lull   No chief complaint on file.  Patient arrives in good spirits.  Presents for diabetes evaluation, education, and management Patient was referred and last seen by Primary Care Provider on 06/25/19.   Patient reports Diabetes was diagnosed ~15 years aho.   Family/Social History:  - DM (mother, father) - Denies alcohol use  - Former smoker (quit 4 years ago)  Human resources officer affordability: none  Patient reports adherence with medications.  Current diabetes medications include: glimepiride 4 mg daily, Lantus 30 units BID, Janumet 50-1000 mg BID Current hypertension medications include: lisinopril 2.5 mg daily Current hyperlipidemia medications include: atorvastatin 40 mg daily, Zetia 10 mg daily  Patient denies hypoglycemic events.  Patient reported dietary habits:  - Pt reports improvement in diet  Patient-reported exercise habits:  - Denies    Patient denies polyuria, polydipsia.  Patient denies neuropathy. Patient denies visual changes. Patient reports self foot exams.    O:  POCT glucose: 189 - ate fish and toast ~12 PM  Lab Results  Component Value Date   HGBA1C 11.5 (A) 06/25/2019   There were no vitals filed for this visit.  Lipid Panel     Component Value Date/Time   CHOL 136 06/25/2019 1018   TRIG 478 (H) 06/25/2019 1018   HDL 30 (L) 06/25/2019 1018   CHOLHDL 4.5 (H) 06/25/2019 1018   LDLCALC 38 06/25/2019 1018   Home CBG range since last PCP visit: 130-150s   Pt does not have her meter or logbook. These values are reported.  Clinical ASCVD: No  The 10-year ASCVD risk score Mikey Bussing DC Jr., et al., 2013) is: 6.2%   Values used to calculate the score:     Age: 58 years     Sex: Female     Is Non-Hispanic African American: No     Diabetic: Yes     Tobacco smoker: No     Systolic Blood Pressure: 425 mmHg     Is BP treated: Yes     HDL Cholesterol: 30 mg/dL     Total Cholesterol: 136 mg/dL   A/P: Diabetes  longstanding currently uncontrolled but home sugars reveal improvement. Patient is able to verbalize appropriate hypoglycemia management plan. Patient is adherent with medication. Control is improving due to improved medication adherence and diet. -Continued current regimen.  - Pt will return in 1 month for medication adjustment.  -Extensively discussed pathophysiology of DM, recommended lifestyle interventions, dietary effects on glycemic control -Counseled on s/sx of and management of hypoglycemia -Next A1C anticipated 09/2019.   ASCVD risk - primary prevention in patient with DM. Last LDL is controlled. ASCVD risk score is not >20%  - continue current therapy. -Continued atorvastatin 40 mg. -Continued Zetia 10 mg daily.    HM:  - UTD on vaccines   Written patient instructions provided.  Total time in face to face counseling 30 minutes.   Follow up Pharmacist Clinic Visit in 1 month.     Benard Halsted, PharmD, [place] [phone]

## 2019-07-14 ENCOUNTER — Ambulatory Visit: Payer: Self-pay

## 2019-07-29 MED FILL — GLIMEPIRIDE 4 MG TABS: 4 | 30 days supply | Qty: 30 | Fill #1

## 2019-07-29 MED FILL — ?EZETIMIBE 10 MG TABS: 10 | 30 days supply | Qty: 30 | Fill #0

## 2019-08-09 ENCOUNTER — Ambulatory Visit: Payer: Self-pay | Attending: Family Medicine | Admitting: Pharmacist

## 2019-08-09 ENCOUNTER — Encounter: Payer: Self-pay | Admitting: Pharmacist

## 2019-08-09 ENCOUNTER — Encounter (INDEPENDENT_AMBULATORY_CARE_PROVIDER_SITE_OTHER): Payer: Self-pay | Admitting: Primary Care

## 2019-08-09 ENCOUNTER — Other Ambulatory Visit: Payer: Self-pay

## 2019-08-09 ENCOUNTER — Ambulatory Visit (INDEPENDENT_AMBULATORY_CARE_PROVIDER_SITE_OTHER): Payer: Self-pay | Admitting: Primary Care

## 2019-08-09 DIAGNOSIS — R05 Cough: Secondary | ICD-10-CM

## 2019-08-09 DIAGNOSIS — J9801 Acute bronchospasm: Secondary | ICD-10-CM

## 2019-08-09 DIAGNOSIS — R058 Other specified cough: Secondary | ICD-10-CM

## 2019-08-09 DIAGNOSIS — IMO0002 Reserved for concepts with insufficient information to code with codable children: Secondary | ICD-10-CM

## 2019-08-09 DIAGNOSIS — E118 Type 2 diabetes mellitus with unspecified complications: Secondary | ICD-10-CM

## 2019-08-09 DIAGNOSIS — R0602 Shortness of breath: Secondary | ICD-10-CM

## 2019-08-09 DIAGNOSIS — E1165 Type 2 diabetes mellitus with hyperglycemia: Secondary | ICD-10-CM

## 2019-08-09 MED ORDER — ALBUTEROL SULFATE HFA 108 (90 BASE) MCG/ACT IN AERS: 2.0000 | INHALATION_SPRAY | Freq: Four times a day (QID) | RESPIRATORY_TRACT | 1 refills | Status: DC | PRN

## 2019-08-09 MED ORDER — GUAIFENESIN 100 MG/5ML PO SOLN: 5.0000 mL | ORAL | 0 refills | Status: DC | PRN

## 2019-08-09 MED FILL — !PROVENTIL HFA 90 MCG INH: 108 (90 BAS | 25 days supply | Qty: 7 | Fill #0

## 2019-08-09 NOTE — Progress Notes (Signed)
Virtual Visit via Telephone Note  I connected with Denise Mosley on 08/09/19 at  2:30 PM EST by telephone and verified that I am speaking with the correct person using two identifiers.   I discussed the limitations, risks, security and privacy concerns of performing an evaluation and management service by telephone and the availability of in person appointments. I also discussed with the patient that there may be a patient responsible charge related to this service. The patient expressed understanding and agreed to proceed.   History of Present Illness: Denise Mosley has had increased shortness of breath and cough for 3 weeks. She is using her grandson asthma machine that is helping. She is drinking tea it helps. Does not want to go to the hospital because her brother died 5 months ago for respiratory infection.    Past Medical History:  Diagnosis Date  . Bell's palsy   . Diabetes mellitus without complication ([place])   . Hyperlipidemia   . Hypertension     Observations/Objective: Review of Systems  Respiratory: Positive for cough and shortness of breath.   All other systems reviewed and are negative.   Assessment and Plan: Denise Mosley was seen today for shortness of breath.  Diagnoses and all orders for this visit:  Shortness of breath  Patient has been using her grandson asthma machine to help with shortness of breath. That  Is concerning. Advised if shortness of breath became worse , chest pain, fever, lost of taste COVID like symptoms proceed to emergency room.  Productive cough Tele visit and language barrier although using an interrupter unable to auscultate rule out significant respiratory problems requiring more than an mucolytic. Instructed if symptoms worst    Bronchospasm albuterol (VENTOLIN HFA) 108 (90 Base) MCG/ACT inhaler; Inhale 2 puffs into the lungs every 6 (six) hours as needed for wheezing or shortness of breath.  Other orders -     albuterol (VENTOLIN HFA) 108  (90 Base) MCG/ACT inhaler; Inhale 2 puffs into the lungs every 6 (six) hours as needed for wheezing or shortness of breath. -     guaiFENesin (ROBITUSSIN) 100 MG/5ML SOLN; Take 5 mLs (100 mg total) by mouth every 4 (four) hours as needed for cough or to loosen phlegm.    Follow Up Instructions:    I discussed the assessment and treatment plan with the patient. The patient was provided an opportunity to ask questions and all were answered. The patient agreed with the plan and demonstrated an understanding of the instructions.   The patient was advised to call back or seek an in-person evaluation if the symptoms worsen or if the condition fails to improve as anticipated.  I provided 18 minutes of non-face-to-face time during this encounter.   [name], NP

## 2019-08-09 NOTE — Progress Notes (Signed)
    S:    PCP: Sharyn Lull   No chief complaint on file.  Patient arrives in good spirits.  Presents for diabetes evaluation, education, and management Patient was referred and last seen by Primary Care Provider on 06/25/19.   Patient reports Diabetes was diagnosed ~15 years ago.   Family/Social History:  - DM (mother, father) - Denies alcohol use  - Former smoker (quit 4 years ago)  Human resources officer affordability: none  Patient reports adherence with medications.  Current diabetes medications include: glimepiride 4 mg daily, Lantus 30 units BID, Janumet 50-1000 mg BID Current hypertension medications include: lisinopril 2.5 mg daily Current hyperlipidemia medications include: atorvastatin 40 mg daily, Zetia 10 mg daily  Patient denies hypoglycemic events.  Patient reported dietary habits:  - Pt reports improvement in diet  Patient-reported exercise habits:  - Denies    Patient denies polyuria, polydipsia.  Patient denies neuropathy. Patient denies visual changes. Patient reports self foot exams.    O:   Lab Results  Component Value Date   HGBA1C 11.5 (A) 06/25/2019   There were no vitals filed for this visit.  Lipid Panel     Component Value Date/Time   CHOL 136 06/25/2019 1018   TRIG 478 (H) 06/25/2019 1018   HDL 30 (L) 06/25/2019 1018   CHOLHDL 4.5 (H) 06/25/2019 1018   LDLCALC 38 06/25/2019 1018   Home CBG range since last PCP visit: 140s  Pt does not have her meter or logbook. These values are reported.  Clinical ASCVD: No  The 10-year ASCVD risk score Mikey Bussing DC Jr., et al., 2013) is: 6.2%   Values used to calculate the score:     Age: 58 years     Sex: Female     Is Non-Hispanic African American: No     Diabetic: Yes     Tobacco smoker: No     Systolic Blood Pressure: 366 mmHg     Is BP treated: Yes     HDL Cholesterol: 30 mg/dL     Total Cholesterol: 136 mg/dL   A/P: Diabetes longstanding currently uncontrolled but home sugars  reveal improvement. Patient is able to verbalize appropriate hypoglycemia management plan. Patient is adherent with medication. Control is improving due to improved medication adherence and diet. -Continued current regimen.  - Pt will return in 1 month for medication adjustment.  -Extensively discussed pathophysiology of DM, recommended lifestyle interventions, dietary effects on glycemic control -Counseled on s/sx of and management of hypoglycemia -Next A1C anticipated 09/2019.   ASCVD risk - primary prevention in patient with DM. Last LDL is controlled. ASCVD risk score is not >20%  - continue current therapy. -Continued atorvastatin 40 mg. -Continued Zetia 10 mg daily.    HM:  - UTD on vaccines   Written patient instructions provided.  Total time in face to face counseling 30 minutes.   Follow up PCP Clinic Visit in 1 month.     Benard Halsted, PharmD, [place] [phone]

## 2019-09-02 ENCOUNTER — Other Ambulatory Visit (INDEPENDENT_AMBULATORY_CARE_PROVIDER_SITE_OTHER): Payer: Self-pay | Admitting: Primary Care

## 2019-09-02 DIAGNOSIS — E7841 Elevated Lipoprotein(a): Secondary | ICD-10-CM

## 2019-09-02 MED FILL — GLIMEPIRIDE 4 MG TABS: 4 | 30 days supply | Qty: 30 | Fill #2

## 2019-09-02 MED FILL — !PROVENTIL HFA 90 MCG INH: 108 (90 BAS | 25 days supply | Qty: 7 | Fill #1

## 2019-09-02 MED FILL — EZETIMIBE 10 MG TAB: 10 | 30 days supply | Qty: 30 | Fill #1

## 2019-09-02 MED FILL — OMEGA-3 ETHYL ESTERS 1 GM C: 1 | 30 days supply | Qty: 120 | Fill #0

## 2019-09-23 ENCOUNTER — Encounter (INDEPENDENT_AMBULATORY_CARE_PROVIDER_SITE_OTHER): Payer: Self-pay | Admitting: Primary Care

## 2019-09-23 ENCOUNTER — Other Ambulatory Visit: Payer: Self-pay

## 2019-09-23 ENCOUNTER — Ambulatory Visit (INDEPENDENT_AMBULATORY_CARE_PROVIDER_SITE_OTHER): Payer: Self-pay | Admitting: Primary Care

## 2019-09-23 VITALS — BP 134/81 | HR 67 | Temp 97.5°F | Ht <= 58 in | Wt 187.0 lb

## 2019-09-23 DIAGNOSIS — E1165 Type 2 diabetes mellitus with hyperglycemia: Secondary | ICD-10-CM

## 2019-09-23 DIAGNOSIS — E7841 Elevated Lipoprotein(a): Secondary | ICD-10-CM

## 2019-09-23 DIAGNOSIS — IMO0002 Reserved for concepts with insufficient information to code with codable children: Secondary | ICD-10-CM

## 2019-09-23 DIAGNOSIS — E781 Pure hyperglyceridemia: Secondary | ICD-10-CM

## 2019-09-23 DIAGNOSIS — Z794 Long term (current) use of insulin: Secondary | ICD-10-CM

## 2019-09-23 DIAGNOSIS — E118 Type 2 diabetes mellitus with unspecified complications: Secondary | ICD-10-CM

## 2019-09-23 DIAGNOSIS — Z1231 Encounter for screening mammogram for malignant neoplasm of breast: Secondary | ICD-10-CM

## 2019-09-23 DIAGNOSIS — E119 Type 2 diabetes mellitus without complications: Secondary | ICD-10-CM

## 2019-09-23 LAB — POCT GLYCOSYLATED HEMOGLOBIN (HGB A1C): Hemoglobin A1C: 11.1 % — AB (ref 4.0–5.6)

## 2019-09-23 LAB — GLUCOSE, POCT (MANUAL RESULT ENTRY): POC Glucose: 237 mg/dl — AB (ref 70–99)

## 2019-09-23 MED ORDER — INSULIN GLARGINE 100 UNIT/ML SOLOSTAR PEN: 30.0000 [IU] | PEN_INJECTOR | Freq: Two times a day (BID) | SUBCUTANEOUS | 6 refills | Status: DC

## 2019-09-23 MED ORDER — BD PEN NEEDLE MINI U/F 31G X 5 MM MISC: 5 refills | Status: DC

## 2019-09-23 MED ORDER — TRUE METRIX BLOOD GLUCOSE TEST VI STRP: ORAL_STRIP | 12 refills | Status: DC

## 2019-09-23 MED ORDER — JANUMET 50-1000 MG PO TABS: 1.0000 | ORAL_TABLET | Freq: Two times a day (BID) | ORAL | 1 refills | Status: DC

## 2019-09-23 MED ORDER — OMEGA-3-ACID ETHYL ESTERS 1 G PO CAPS: 2.0000 g | ORAL_CAPSULE | Freq: Two times a day (BID) | ORAL | 1 refills | Status: DC

## 2019-09-23 MED ORDER — LISINOPRIL 2.5 MG PO TABS: 2.5000 mg | ORAL_TABLET | Freq: Every day | ORAL | 1 refills | Status: DC

## 2019-09-23 MED ORDER — EZETIMIBE 10 MG PO TABS: 10.0000 mg | ORAL_TABLET | Freq: Every day | ORAL | 1 refills | Status: DC

## 2019-09-23 MED ORDER — ALBUTEROL SULFATE HFA 108 (90 BASE) MCG/ACT IN AERS: 2.0000 | INHALATION_SPRAY | Freq: Four times a day (QID) | RESPIRATORY_TRACT | 1 refills | Status: DC | PRN

## 2019-09-23 MED ORDER — GLIMEPIRIDE 4 MG PO TABS: 4.0000 mg | ORAL_TABLET | Freq: Every day | ORAL | 1 refills | Status: DC

## 2019-09-23 MED ORDER — ATORVASTATIN CALCIUM 40 MG PO TABS: 40.0000 mg | ORAL_TABLET | Freq: Every day | ORAL | 1 refills | Status: DC

## 2019-09-23 MED FILL — TRUEPLUS 5-BEVEL PEN NEEDLE: 31G X 5 MM | 25 days supply | Qty: 100 | Fill #0

## 2019-09-23 MED FILL — TRUE METRIX TEST STRIP: 25 days supply | Qty: 100 | Fill #0

## 2019-09-23 MED FILL — LISINOPRIL 2.5 MG TABLET: 2.5 | 30 days supply | Qty: 30 | Fill #0

## 2019-09-23 MED FILL — ?ATORVASTATIN 40MG TABLET: 40 | 30 days supply | Qty: 30 | Fill #0

## 2019-09-23 MED FILL — $LANTUS SOLOSTAR 100 UNITS/: 100 | 24 days supply | Qty: 15 | Fill #0

## 2019-09-23 MED FILL — !PROVENTIL HFA 90 MCG INH: 108 (90 BAS | 25 days supply | Qty: 7 | Fill #0

## 2019-09-23 MED FILL — JANUMET 50-1,000 MG TABLET: 50-1000 | 30 days supply | Qty: 60 | Fill #1

## 2019-09-23 NOTE — Patient Instructions (Signed)
Diabetes mellitus y nutricin, en adultos Diabetes Mellitus and Nutrition, Adult Si sufre de diabetes (diabetes mellitus), es muy importante tener hbitos alimenticios saludables debido a que sus niveles de azcar en la sangre (glucosa) se ven afectados en gran medida por lo que come y bebe. Comer alimentos saludables en las cantidades adecuadas, aproximadamente a la misma hora todos los das, lo ayudar a:  Controlar la glucemia.  Disminuir el riesgo de sufrir una enfermedad cardaca.  Mejorar la presin arterial.  Alcanzar o mantener un peso saludable. Todas las personas que sufren de diabetes son diferentes y cada una tiene necesidades diferentes en cuanto a un plan de alimentacin. El mdico puede recomendarle que trabaje con un especialista en dietas y nutricin (nutricionista) para elaborar el mejor plan para usted. Su plan de alimentacin puede variar segn factores como:  Las caloras que necesita.  Los medicamentos que toma.  Su peso.  Sus niveles de glucemia, presin arterial y colesterol.  Su nivel de actividad.  Otras afecciones que tenga, como enfermedades cardacas o renales. Cmo me afectan los carbohidratos? Los carbohidratos, o hidratos de carbono, afectan su nivel de glucemia ms que cualquier otro tipo de alimento. La ingesta de carbohidratos naturalmente aumenta la cantidad de glucosa en la sangre. El recuento de carbohidratos es un mtodo destinado a llevar un registro de la cantidad de carbohidratos que se consumen. El recuento de carbohidratos es importante para mantener la glucemia a un nivel saludable, especialmente si utiliza insulina o toma determinados medicamentos por va oral para la diabetes. Es importante conocer la cantidad de carbohidratos que se pueden ingerir en cada comida sin correr ningn riesgo. Esto es diferente en cada persona. Su nutricionista puede ayudarlo a calcular la cantidad de carbohidratos que debe ingerir en cada comida y en cada  refrigerio. Entre los alimentos que contienen carbohidratos, se incluyen:  Pan, cereal, arroz, pastas y galletas.  Papas y maz.  Guisantes, frijoles y lentejas.  Leche y yogur.  Frutas y jugo.  Postres, como pasteles, galletas, helado y caramelos. Cmo me afecta el alcohol? El alcohol puede provocar disminuciones sbitas de la glucemia (hipoglucemia), especialmente si utiliza insulina o toma determinados medicamentos por va oral para la diabetes. La hipoglucemia es una afeccin potencialmente mortal. Los sntomas de la hipoglucemia (somnolencia, mareos y confusin) son similares a los sntomas de haber consumido demasiado alcohol. Si el mdico afirma que el alcohol es seguro para usted, siga estas pautas:  Limite el consumo de alcohol a no ms de 1medida por da si es mujer y no est embarazada, y a 2medidas si es hombre. Una medida equivale a 12oz (355ml) de cerveza, 5oz (148ml) de vino o 1oz (44ml) de bebidas alcohlicas de alta graduacin.  No beba con el estmago vaco.  Mantngase hidratado bebiendo agua, refrescos dietticos o t helado sin azcar.  Tenga en cuenta que los refrescos comunes, los jugos y otras bebida para mezclar pueden contener mucha azcar y se deben contar como carbohidratos. Cules son algunos consejos para seguir este plan?  Leer las etiquetas de los alimentos  Comience por leer el tamao de la porcin en la "Informacin nutricional" en las etiquetas de los alimentos envasados y las bebidas. La cantidad de caloras, carbohidratos, grasas y otros nutrientes mencionados en la etiqueta se basan en una porcin del alimento. Muchos alimentos contienen ms de una porcin por envase.  Verifique la cantidad total de gramos (g) de carbohidratos totales en una porcin. Puede calcular la cantidad de porciones de carbohidratos al dividir el   total de carbohidratos por 15. Por ejemplo, si un alimento tiene un total de 30g de carbohidratos, equivale a 2  porciones de carbohidratos.  Verifique la cantidad de gramos (g) de grasas saturadas y grasas trans en una porcin. Escoja alimentos que no contengan grasa o que tengan un bajo contenido.  Verifique la cantidad de miligramos (mg) de sal (sodio) en una porcin. La mayora de las personas deben limitar la ingesta de sodio total a menos de 2300mg por da.  Siempre consulte la informacin nutricional de los alimentos etiquetados como "con bajo contenido de grasa" o "sin grasa". Estos alimentos pueden tener un mayor contenido de azcar agregada o carbohidratos refinados, y deben evitarse.  Hable con su nutricionista para identificar sus objetivos diarios en cuanto a los nutrientes mencionados en la etiqueta. Al ir de compras  Evite comprar alimentos procesados, enlatados o precocinados. Estos alimentos tienden a tener una mayor cantidad de grasa, sodio y azcar agregada.  Compre en la zona exterior de la tienda de comestibles. Esta zona incluye frutas y verduras frescas, granos a granel, carnes frescas y productos lcteos frescos. Al cocinar  Utilice mtodos de coccin a baja temperatura, como hornear, en lugar de mtodos de coccin a alta temperatura, como frer en abundante aceite.  Cocine con aceites saludables, como el aceite de oliva, canola o girasol.  Evite cocinar con manteca, crema o carnes con alto contenido de grasa. Planificacin de las comidas  Coma las comidas y los refrigerios regularmente, preferentemente a la misma hora todos los das. Evite pasar largos perodos de tiempo sin comer.  Consuma alimentos ricos en fibra, como frutas frescas, verduras, frijoles y cereales integrales. Consulte a su nutricionista sobre cuntas porciones de carbohidratos puede consumir en cada comida.  Consuma entre 4 y 6 onzas (oz) de protenas magras por da, como carnes magras, pollo, pescado, huevos o tofu. Una onza de protena magra equivale a: ? 1 onza de carne, pollo o  pescado. ? 1huevo. ?  taza de tofu.  Coma algunos alimentos por da que contengan grasas saludables, como aguacates, frutos secos, semillas y pescado. Estilo de vida  Controle su nivel de glucemia con regularidad.  Haga actividad fsica habitualmente como se lo haya indicado el mdico. Esto puede incluir lo siguiente: ? 150minutos semanales de ejercicio de intensidad moderada o alta. Esto podra incluir caminatas dinmicas, ciclismo o gimnasia acutica. ? Realizar ejercicios de elongacin y de fortalecimiento, como yoga o levantamiento de pesas, por lo menos 2veces por semana.  Tome los medicamentos como se lo haya indicado el mdico.  No consuma ningn producto que contenga nicotina o tabaco, como cigarrillos y cigarrillos electrnicos. Si necesita ayuda para dejar de fumar, consulte al mdico.  Trabaje con un asesor o instructor en diabetes para identificar estrategias para controlar el estrs y cualquier desafo emocional y social. Preguntas para hacerle al mdico  Es necesario que consulte a un instructor en el cuidado de la diabetes?  Es necesario que me rena con un nutricionista?  A qu nmero puedo llamar si tengo preguntas?  Cules son los mejores momentos para controlar la glucemia? Dnde encontrar ms informacin:  Asociacin Estadounidense de la Diabetes (American Diabetes Association): diabetes.org  Academia de Nutricin y Diettica (Academy of Nutrition and Dietetics): www.eatright.org  Instituto Nacional de la Diabetes y las Enfermedades Digestivas y Renales (National Institute of Diabetes and Digestive and Kidney Diseases, NIH): www.niddk.nih.gov Resumen  Un plan de alimentacin saludable lo ayudar a controlar la glucemia y mantener un estilo de vida saludable.    Trabajar con un especialista en dietas y nutricin (nutricionista) puede ayudarlo a elaborar el mejor plan de alimentacin para usted.  Tenga en cuenta que los carbohidratos (hidratos de  carbono) y el alcohol tienen efectos inmediatos en sus niveles de glucemia. Es importante contar los carbohidratos que ingiere y consumir alcohol con prudencia. Esta informacin no tiene como fin reemplazar el consejo del mdico. Asegrese de hacerle al mdico cualquier pregunta que tenga. Document Revised: 05/13/2017 Document Reviewed: 12/23/2016 Elsevier Patient Education  2020 [employer]  

## 2019-09-23 NOTE — Progress Notes (Signed)
Established Patient Office Visit  Subjective:  Patient ID: Denise Mosley, female    DOB: 24-Jan-1961  Age: 59 y.o. MRN: [id number]  CC:  Chief Complaint  Patient presents with  . Follow-up    DM  . Medication Refill    HPI Denise Mosley presents for managment of diabetes she has been out of medication patient states pharmacy leaves message but it is in [place] and she doesn't understand.   Past Medical History:  Diagnosis Date  . Bell's palsy   . Diabetes mellitus without complication ([place])   . Hyperlipidemia   . Hypertension     Past Surgical History:  Procedure Laterality Date  . CESAREAN SECTION    . CHOLECYSTECTOMY      Family History  Problem Relation Age of Onset  . Diabetes Mother   . Diabetes Father     Social History   Socioeconomic History  . Marital status: Single    Spouse name: Not on file  . Number of children: Not on file  . Years of education: Not on file  . Highest education level: Not on file  Occupational History  . Not on file  Tobacco Use  . Smoking status: Former Research scientist (life sciences)  . Smokeless tobacco: Never Used  . Tobacco comment: Quit 4 years ago  Substance and Sexual Activity  . Alcohol use: No  . Drug use: No  . Sexual activity: Not on file  Other Topics Concern  . Not on file  Social History Narrative  . Not on file   Social Determinants of Health   Financial Resource Strain:   . Difficulty of Paying Living Expenses: Not on file  Food Insecurity:   . Worried About Charity fundraiser in the Last Year: Not on file  . Ran Out of Food in the Last Year: Not on file  Transportation Needs:   . Lack of Transportation (Medical): Not on file  . Lack of Transportation (Non-Medical): Not on file  Physical Activity:   . Days of Exercise per Week: Not on file  . Minutes of Exercise per Session: Not on file  Stress:   . Feeling of Stress : Not on file  Social Connections:   . Frequency of Communication with Friends and Family: Not  on file  . Frequency of Social Gatherings with Friends and Family: Not on file  . Attends Religious Services: Not on file  . Active Member of Clubs or Organizations: Not on file  . Attends Archivist Meetings: Not on file  . Marital Status: Not on file  Intimate Partner Violence:   . Fear of Current or Ex-Partner: Not on file  . Emotionally Abused: Not on file  . Physically Abused: Not on file  . Sexually Abused: Not on file    Outpatient Medications Prior to Visit  Medication Sig Dispense Refill  . Blood Glucose Monitoring Suppl (TRUE METRIX METER) w/Device KIT Use as instructed 1 kit 0  . albuterol (VENTOLIN HFA) 108 (90 Base) MCG/ACT inhaler Inhale 2 puffs into the lungs every 6 (six) hours as needed for wheezing or shortness of breath. 6.7 g 1  . atorvastatin (LIPITOR) 40 MG tablet TAKE 1 TABLET (40 MG TOTAL) BY MOUTH DAILY. 30 tablet 2  . ezetimibe (ZETIA) 10 MG tablet Take 1 tablet (10 mg total) by mouth daily. 90 tablet 0  . glimepiride (AMARYL) 4 MG tablet Take 1 tablet (4 mg total) by mouth daily before breakfast. 90 tablet 0  . glucose  blood (TRUE METRIX BLOOD GLUCOSE TEST) test strip Use as instructed 100 each 12  . Insulin Glargine (LANTUS) 100 UNIT/ML Solostar Pen Inject 30 Units into the skin 2 (two) times daily. 4 pen 3  . Insulin Pen Needle (B-D UF III MINI PEN NEEDLES) 31G X 5 MM MISC Use as instructed 100 each 5  . lisinopril (ZESTRIL) 2.5 MG tablet Take 1 tablet (2.5 mg total) by mouth daily. 90 tablet 0  . omega-3 acid ethyl esters (LOVAZA) 1 g capsule Take 2 capsules (2 g total) by mouth 2 (two) times daily. 120 capsule 0  . sitaGLIPtin-metformin (JANUMET) 50-1000 MG tablet Take 1 tablet by mouth 2 (two) times daily with a meal. 180 tablet 5  . blood glucose meter kit and supplies Dispense based on patient and insurance preference. Use up to four times daily as directed. (E11.8). 1 each 0  . guaiFENesin (ROBITUSSIN) 100 MG/5ML SOLN Take 5 mLs (100 mg total)  by mouth every 4 (four) hours as needed for cough or to loosen phlegm. 236 mL 0   No facility-administered medications prior to visit.    No Known Allergies  ROS Review of Systems  Endocrine: Positive for polydipsia and polyuria.  All other systems reviewed and are negative.     Objective:    Physical Exam  Constitutional: She is oriented to person, place, and time. She appears well-developed and well-nourished.  Morbid obesed   HENT:  Head: Normocephalic.  Cardiovascular: Normal rate and regular rhythm.  Pulmonary/Chest: Effort normal and breath sounds normal.  Abdominal: Bowel sounds are normal.  Musculoskeletal:        General: Normal range of motion.     Cervical back: Neck supple.  Neurological: She is oriented to person, place, and time.  Skin: Skin is warm.  Psychiatric: She has a normal mood and affect. Her behavior is normal.    BP 134/81 (BP Location: Right Arm, Patient Position: Sitting, Cuff Size: Normal)   Pulse 67   Temp (!) 97.5 F (36.4 C) (Temporal)   Ht 4' 8"  (1.422 m)   Wt 187 lb (84.8 kg)   SpO2 92%   BMI 41.92 kg/m  Wt Readings from Last 3 Encounters:  09/23/19 187 lb (84.8 kg)  06/25/19 183 lb 9.6 oz (83.3 kg)  03/25/19 191 lb 9.6 oz (86.9 kg)     Health Maintenance Due  Topic Date Due  . OPHTHALMOLOGY EXAM  04/15/1973  . Fecal DNA (Cologuard)  04/15/2013  . MAMMOGRAM  04/29/2016    There are no preventive care reminders to display for this patient.  Lab Results  Component Value Date   TSH 2.610 08/14/2017   Lab Results  Component Value Date   WBC 7.0 06/25/2019   HGB 13.9 06/25/2019   HCT 42.2 06/25/2019   MCV 86 06/25/2019   PLT 203 06/25/2019   Lab Results  Component Value Date   NA 136 06/25/2019   K 4.2 06/25/2019   CO2 21 06/25/2019   GLUCOSE 190 (H) 06/25/2019   BUN 12 06/25/2019   CREATININE 0.63 06/25/2019   BILITOT 0.3 06/25/2019   ALKPHOS 159 (H) 06/25/2019   AST 50 (H) 06/25/2019   ALT 46 (H) 06/25/2019    PROT 7.0 06/25/2019   ALBUMIN 4.1 06/25/2019   CALCIUM 9.7 06/25/2019   ANIONGAP 11 01/23/2017   Lab Results  Component Value Date   CHOL 136 06/25/2019   Lab Results  Component Value Date   HDL 30 (L) 06/25/2019  Lab Results  Component Value Date   LDLCALC 38 06/25/2019   Lab Results  Component Value Date   TRIG 478 (H) 06/25/2019   Lab Results  Component Value Date   CHOLHDL 4.5 (H) 06/25/2019   Lab Results  Component Value Date   HGBA1C 11.1 (A) 09/23/2019      Assessment & Plan:  Denise Mosley was seen today for follow-up and medication refill.  Diagnoses and all orders for this visit:  Uncontrolled type 2 diabetes mellitus with complication (HCC) -     HgB A1c 11.2 -     Glucose (CBG) -     glucose blood (TRUE METRIX BLOOD GLUCOSE TEST) test strip; Use as instructed  Type 2 diabetes mellitus without complication, with long-term current use of insulin (HCC) -     Insulin Glargine (LANTUS) 100 UNIT/ML Solostar Pen; Inject 30 Units into the skin 2 (two) times daily. -     glimepiride (AMARYL) 4 MG tablet; Take 1 tablet (4 mg total) by mouth daily before breakfast. -     lisinopril (ZESTRIL) 2.5 MG tablet; Take 1 tablet (2.5 mg total) by mouth daily. -     Insulin Pen Needle (B-D UF III MINI PEN NEEDLES) 31G X 5 MM MISC; Use as instructed -     sitaGLIPtin-metformin (JANUMET) 50-1000 MG tablet; Take 1 tablet by mouth 2 (two) times daily with a meal. -     CBC with Differential -     CMP14+EGFR; Future  Hypertriglyceridemia Elevated cholesterol that can lead to heart attack and stroke. Decrease your fatty foods, red meat, cheese, milk and increase fiber like whole grains and veggies. It is okay to eat fresh fruit as you mentioned- Fruits are naturally sweeten with fructose limit to 1-2 servings a day. Patient verbalized understanding.   Continue.  -  omega-3 acid ethyl esters (LOVAZA) 1 g capsule; Take 2 capsules (2 g total) by mouth 2 (two) times daily.   atorvastatin  (LIPITOR) 40 MG tablet; Take 1 tablet (40 mg total) by mouth daily.-     Lipid panel; Future  Encounter for screening mammogram for malignant neoplasm of breast Patient completed application for BCCP while in clinic and application has and faxed to [facility]. Patient aware that [facility] will contact her directly to schedule appointment.  Other orders -     albuterol (VENTOLIN HFA) 108 (90 Base) MCG/ACT inhaler; Inhale 2 puffs into the lungs every 6 (six) hours as needed for wheezing or shortness of breath.    Meds ordered this encounter  Medications  . Insulin Glargine (LANTUS) 100 UNIT/ML Solostar Pen    Sig: Inject 30 Units into the skin 2 (two) times daily.    Dispense:  4 pen    Refill:  6    Please disregard previous rx for 60 units.  Marland Kitchen glimepiride (AMARYL) 4 MG tablet    Sig: Take 1 tablet (4 mg total) by mouth daily before breakfast.    Dispense:  90 tablet    Refill:  1  . atorvastatin (LIPITOR) 40 MG tablet    Sig: Take 1 tablet (40 mg total) by mouth daily.    Dispense:  90 tablet    Refill:  1  . lisinopril (ZESTRIL) 2.5 MG tablet    Sig: Take 1 tablet (2.5 mg total) by mouth daily.    Dispense:  90 tablet    Refill:  1  . glucose blood (TRUE METRIX BLOOD GLUCOSE TEST) test strip    Sig: Use  as instructed    Dispense:  100 each    Refill:  12  . Insulin Pen Needle (B-D UF III MINI PEN NEEDLES) 31G X 5 MM MISC    Sig: Use as instructed    Dispense:  100 each    Refill:  5  . sitaGLIPtin-metformin (JANUMET) 50-1000 MG tablet    Sig: Take 1 tablet by mouth 2 (two) times daily with a meal.    Dispense:  180 tablet    Refill:  1  . ezetimibe (ZETIA) 10 MG tablet    Sig: Take 1 tablet (10 mg total) by mouth daily.    Dispense:  90 tablet    Refill:  1  . omega-3 acid ethyl esters (LOVAZA) 1 g capsule    Sig: Take 2 capsules (2 g total) by mouth 2 (two) times daily.    Dispense:  120 capsule    Refill:  1  . albuterol (VENTOLIN HFA) 108 (90 Base) MCG/ACT inhaler    Sig:  Inhale 2 puffs into the lungs every 6 (six) hours as needed for wheezing or shortness of breath.    Dispense:  6.7 g    Refill:  1    Follow-up: Return in about 3 months (around 12/22/2019) for DM in person in morning  fasting labs.    [name], NP

## 2019-11-26 ENCOUNTER — Other Ambulatory Visit: Payer: Self-pay | Admitting: Primary Care

## 2019-11-26 DIAGNOSIS — Z1231 Encounter for screening mammogram for malignant neoplasm of breast: Secondary | ICD-10-CM

## 2019-12-22 ENCOUNTER — Ambulatory Visit: Payer: Self-pay

## 2019-12-22 ENCOUNTER — Encounter (INDEPENDENT_AMBULATORY_CARE_PROVIDER_SITE_OTHER): Payer: Self-pay | Admitting: Primary Care

## 2019-12-22 ENCOUNTER — Other Ambulatory Visit: Payer: Self-pay

## 2019-12-22 ENCOUNTER — Ambulatory Visit (INDEPENDENT_AMBULATORY_CARE_PROVIDER_SITE_OTHER): Payer: Self-pay | Admitting: Primary Care

## 2019-12-22 VITALS — BP 125/81 | HR 69 | Temp 96.5°F | Ht <= 58 in | Wt 182.6 lb

## 2019-12-22 DIAGNOSIS — Z91199 Patient's noncompliance with other medical treatment and regimen due to unspecified reason: Secondary | ICD-10-CM

## 2019-12-22 DIAGNOSIS — E119 Type 2 diabetes mellitus without complications: Secondary | ICD-10-CM

## 2019-12-22 DIAGNOSIS — Z9119 Patient's noncompliance with other medical treatment and regimen: Secondary | ICD-10-CM

## 2019-12-22 DIAGNOSIS — IMO0002 Reserved for concepts with insufficient information to code with codable children: Secondary | ICD-10-CM

## 2019-12-22 DIAGNOSIS — E1165 Type 2 diabetes mellitus with hyperglycemia: Secondary | ICD-10-CM

## 2019-12-22 DIAGNOSIS — E118 Type 2 diabetes mellitus with unspecified complications: Secondary | ICD-10-CM

## 2019-12-22 DIAGNOSIS — Z794 Long term (current) use of insulin: Secondary | ICD-10-CM

## 2019-12-22 LAB — POCT GLYCOSYLATED HEMOGLOBIN (HGB A1C): Hemoglobin A1C: 12.8 % — AB (ref 4.0–5.6)

## 2019-12-22 LAB — GLUCOSE, POCT (MANUAL RESULT ENTRY): POC Glucose: 309 mg/dl — AB (ref 70–99)

## 2019-12-22 MED ORDER — TRUE METRIX AIR GLUCOSE METER W/DEVICE KIT: 1.0000 | PACK | Freq: Three times a day (TID) | 0 refills | Status: AC

## 2019-12-22 MED ORDER — BLOOD GLUCOSE METER KIT: PACK | 0 refills | Status: DC

## 2019-12-22 MED FILL — !TRUE METRIX BLOOD GLUCOSE: 1 days supply | Qty: 1 | Fill #0

## 2019-12-22 NOTE — Progress Notes (Signed)
Denise Mosley is a 59 y.o. female who presents for an follow up evaluation of Type2 diabetes mellitus.  Current symptoms/problems include polydipsia and polyuria and have been worsening. Symptoms have been present for several month.  Current diabetic medications include oral agents (triple therapy): Amaryl 4 mg, Junumet 50/1000mg  twice twice a day, and insulin injections: she takes 25units twice a day. Prescribed increased 30 units twice a day   The patient was initially diagnosed with Type 2 diabetes mellitus based on the following criteria: A1C .  Current monitoring regimen: home blood tests - none CBG machine broken Home blood sugar records: none Any episodes of hypoglycemia? no  Known diabetic complications: cardiovascular disease Cardiovascular risk factors:  Eye exam current (within one year): no Weight trend: stable Current diet: in general, an "unhealthy" diet Current exercise: none Medication Compliance?  No Is She on ACE inhibitorlisinopril (generic)   Review of Systems  Genitourinary: Positive for urgency.  All other systems reviewed and are negative.   Objective:    BP 125/81 (BP Location: Right Arm, Patient Position: Sitting, Cuff Size: Normal)   Pulse 69   Temp (!) 96.5 F (35.8 C) (Temporal)   Ht 4\' 8"  (1.422 m)   Wt 182 lb 9.6 oz (82.8 kg)   SpO2 94%   BMI 40.94 kg/m   Physical Exam Constitutional:      Appearance: She is obese.  HENT:     Head: Normocephalic.  Cardiovascular:     Rate and Rhythm: Normal rate and regular rhythm.     Pulses: Normal pulses.     Heart sounds: Normal heart sounds.  Pulmonary:     Effort: Pulmonary effort is normal.     Breath sounds: Normal breath sounds.  Abdominal:     General: Bowel sounds are normal.  Musculoskeletal:        General: Normal range of motion.     Cervical back: Normal range of motion and neck supple.  Skin:    General: Skin is warm and dry.  Neurological:     Mental Status: She is alert  and oriented to person, place, and time.  Psychiatric:        Mood and Affect: Mood normal.        Behavior: Behavior normal.        Thought Content: Thought content normal.        Judgment: Judgment normal.      Lab Review Glucose (mg/dL)  Date Value  06/25/2019 190 (H)  05/14/2018 114 (H)  08/14/2017 323 (H)   Glucose, Bld (mg/dL)  Date Value  01/23/2017 154 (H)  01/22/2017 172 (H)  01/21/2017 160 (H)   CO2 (mmol/L)  Date Value  06/25/2019 21  05/14/2018 25  08/14/2017 21   BUN (mg/dL)  Date Value  06/25/2019 12  05/14/2018 11  08/14/2017 12   Creatinine, Ser (mg/dL)  Date Value  06/25/2019 0.63  05/14/2018 0.61  08/14/2017 0.65      Assessment:    Diabetes Mellitus type II, under unsatisfactory control.   Denise Mosley was seen today for diabetes.  Diagnoses and all orders for this visit:  Type 2 diabetes mellitus without complication, with long-term current use of insulin ([place]) ADA recommends the following therapeutic goals for glycemic control related to A1c measurements: Goal of therapy: Less than 6.5 hemoglobin A1c.  Reference clinical practice recommendations. Foods that are high in carbohydrates are the following rice, potatoes, breads, sugars, and pastas.  Reduction in the intake (eating) will  assist in lowering your blood sugars. -     HgB A1c -     Glucose (CBG)  Personal history of noncompliance with medical treatment, presenting hazards to health Learn today she could not read so she just takes medication not exactly what they are and can recognized number on the pen to give insulin will reach out to clinical nurse and CSW for avenues to provide medication fixed in a pill box    Plan:    1.  Rx changes: increased Lantus 30 units twice daily 2.  Education: Reviewed 'ABCs' of diabetes management (respective goals in parentheses):  A1C (<7), blood pressure (<130/80), and cholesterol (LDL <100). 3. Discussed pathophysiology of DM; side affects of  uncontrol diabetes  4. CHO counting diet discussed.  Reviewed CHO amount in various foods and how to read nutrition labels.  Discussed recommended serving sizes.  5.  Recommend check BG 3  times a day 6.  Recommended increase physical activity - goal is 150 minutes per week 7. Follow up:3 month

## 2019-12-22 NOTE — Patient Instructions (Signed)
Diabetes mellitus y nutricin, en adultos Diabetes Mellitus and Nutrition, Adult Si sufre de diabetes (diabetes mellitus), es muy importante tener hbitos alimenticios saludables debido a que sus niveles de azcar en la sangre (glucosa) se ven afectados en gran medida por lo que come y bebe. Comer alimentos saludables en las cantidades adecuadas, aproximadamente a la misma hora todos los das, lo ayudar a:  Controlar la glucemia.  Disminuir el riesgo de sufrir una enfermedad cardaca.  Mejorar la presin arterial.  Alcanzar o mantener un peso saludable. Todas las personas que sufren de diabetes son diferentes y cada una tiene necesidades diferentes en cuanto a un plan de alimentacin. El mdico puede recomendarle que trabaje con un especialista en dietas y nutricin (nutricionista) para elaborar el mejor plan para usted. Su plan de alimentacin puede variar segn factores como:  Las caloras que necesita.  Los medicamentos que toma.  Su peso.  Sus niveles de glucemia, presin arterial y colesterol.  Su nivel de actividad.  Otras afecciones que tenga, como enfermedades cardacas o renales. Cmo me afectan los carbohidratos? Los carbohidratos, o hidratos de carbono, afectan su nivel de glucemia ms que cualquier otro tipo de alimento. La ingesta de carbohidratos naturalmente aumenta la cantidad de glucosa en la sangre. El recuento de carbohidratos es un mtodo destinado a llevar un registro de la cantidad de carbohidratos que se consumen. El recuento de carbohidratos es importante para mantener la glucemia a un nivel saludable, especialmente si utiliza insulina o toma determinados medicamentos por va oral para la diabetes. Es importante conocer la cantidad de carbohidratos que se pueden ingerir en cada comida sin correr ningn riesgo. Esto es diferente en cada persona. Su nutricionista puede ayudarlo a calcular la cantidad de carbohidratos que debe ingerir en cada comida y en cada  refrigerio. Entre los alimentos que contienen carbohidratos, se incluyen:  Pan, cereal, arroz, pastas y galletas.  Papas y maz.  Guisantes, frijoles y lentejas.  Leche y yogur.  Frutas y jugo.  Postres, como pasteles, galletas, helado y caramelos. Cmo me afecta el alcohol? El alcohol puede provocar disminuciones sbitas de la glucemia (hipoglucemia), especialmente si utiliza insulina o toma determinados medicamentos por va oral para la diabetes. La hipoglucemia es una afeccin potencialmente mortal. Los sntomas de la hipoglucemia (somnolencia, mareos y confusin) son similares a los sntomas de haber consumido demasiado alcohol. Si el mdico afirma que el alcohol es seguro para usted, siga estas pautas:  Limite el consumo de alcohol a no ms de 1medida por da si es mujer y no est embarazada, y a 2medidas si es hombre. Una medida equivale a 12oz (355ml) de cerveza, 5oz (148ml) de vino o 1oz (44ml) de bebidas alcohlicas de alta graduacin.  No beba con el estmago vaco.  Mantngase hidratado bebiendo agua, refrescos dietticos o t helado sin azcar.  Tenga en cuenta que los refrescos comunes, los jugos y otras bebida para mezclar pueden contener mucha azcar y se deben contar como carbohidratos. Cules son algunos consejos para seguir este plan?  Leer las etiquetas de los alimentos  Comience por leer el tamao de la porcin en la "Informacin nutricional" en las etiquetas de los alimentos envasados y las bebidas. La cantidad de caloras, carbohidratos, grasas y otros nutrientes mencionados en la etiqueta se basan en una porcin del alimento. Muchos alimentos contienen ms de una porcin por envase.  Verifique la cantidad total de gramos (g) de carbohidratos totales en una porcin. Puede calcular la cantidad de porciones de carbohidratos al dividir el   total de carbohidratos por 15. Por ejemplo, si un alimento tiene un total de 30g de carbohidratos, equivale a 2  porciones de carbohidratos.  Verifique la cantidad de gramos (g) de grasas saturadas y grasas trans en una porcin. Escoja alimentos que no contengan grasa o que tengan un bajo contenido.  Verifique la cantidad de miligramos (mg) de sal (sodio) en una porcin. La mayora de las personas deben limitar la ingesta de sodio total a menos de 2300mg por da.  Siempre consulte la informacin nutricional de los alimentos etiquetados como "con bajo contenido de grasa" o "sin grasa". Estos alimentos pueden tener un mayor contenido de azcar agregada o carbohidratos refinados, y deben evitarse.  Hable con su nutricionista para identificar sus objetivos diarios en cuanto a los nutrientes mencionados en la etiqueta. Al ir de compras  Evite comprar alimentos procesados, enlatados o precocinados. Estos alimentos tienden a tener una mayor cantidad de grasa, sodio y azcar agregada.  Compre en la zona exterior de la tienda de comestibles. Esta zona incluye frutas y verduras frescas, granos a granel, carnes frescas y productos lcteos frescos. Al cocinar  Utilice mtodos de coccin a baja temperatura, como hornear, en lugar de mtodos de coccin a alta temperatura, como frer en abundante aceite.  Cocine con aceites saludables, como el aceite de oliva, canola o girasol.  Evite cocinar con manteca, crema o carnes con alto contenido de grasa. Planificacin de las comidas  Coma las comidas y los refrigerios regularmente, preferentemente a la misma hora todos los das. Evite pasar largos perodos de tiempo sin comer.  Consuma alimentos ricos en fibra, como frutas frescas, verduras, frijoles y cereales integrales. Consulte a su nutricionista sobre cuntas porciones de carbohidratos puede consumir en cada comida.  Consuma entre 4 y 6 onzas (oz) de protenas magras por da, como carnes magras, pollo, pescado, huevos o tofu. Una onza de protena magra equivale a: ? 1 onza de carne, pollo o  pescado. ? 1huevo. ?  taza de tofu.  Coma algunos alimentos por da que contengan grasas saludables, como aguacates, frutos secos, semillas y pescado. Estilo de vida  Controle su nivel de glucemia con regularidad.  Haga actividad fsica habitualmente como se lo haya indicado el mdico. Esto puede incluir lo siguiente: ? 150minutos semanales de ejercicio de intensidad moderada o alta. Esto podra incluir caminatas dinmicas, ciclismo o gimnasia acutica. ? Realizar ejercicios de elongacin y de fortalecimiento, como yoga o levantamiento de pesas, por lo menos 2veces por semana.  Tome los medicamentos como se lo haya indicado el mdico.  No consuma ningn producto que contenga nicotina o tabaco, como cigarrillos y cigarrillos electrnicos. Si necesita ayuda para dejar de fumar, consulte al mdico.  Trabaje con un asesor o instructor en diabetes para identificar estrategias para controlar el estrs y cualquier desafo emocional y social. Preguntas para hacerle al mdico  Es necesario que consulte a un instructor en el cuidado de la diabetes?  Es necesario que me rena con un nutricionista?  A qu nmero puedo llamar si tengo preguntas?  Cules son los mejores momentos para controlar la glucemia? Dnde encontrar ms informacin:  Asociacin Estadounidense de la Diabetes (American Diabetes Association): diabetes.org  Academia de Nutricin y Diettica (Academy of Nutrition and Dietetics): www.eatright.org  Instituto Nacional de la Diabetes y las Enfermedades Digestivas y Renales (National Institute of Diabetes and Digestive and Kidney Diseases, NIH): www.niddk.nih.gov Resumen  Un plan de alimentacin saludable lo ayudar a controlar la glucemia y mantener un estilo de vida saludable.    Trabajar con un especialista en dietas y nutricin (nutricionista) puede ayudarlo a elaborar el mejor plan de alimentacin para usted.  Tenga en cuenta que los carbohidratos (hidratos de  carbono) y el alcohol tienen efectos inmediatos en sus niveles de glucemia. Es importante contar los carbohidratos que ingiere y consumir alcohol con prudencia. Esta informacin no tiene como fin reemplazar el consejo del mdico. Asegrese de hacerle al mdico cualquier pregunta que tenga. Document Revised: 05/13/2017 Document Reviewed: 12/23/2016 Elsevier Patient Education  2020 [employer]  

## 2019-12-23 ENCOUNTER — Other Ambulatory Visit: Payer: Self-pay | Admitting: Pharmacist

## 2019-12-23 LAB — CBC WITH DIFFERENTIAL/PLATELET
Basophils Absolute: 0 10*3/uL (ref 0.0–0.2)
Basos: 1 %
EOS (ABSOLUTE): 0.3 10*3/uL (ref 0.0–0.4)
Eos: 4 %
Hematocrit: 46.2 % (ref 34.0–46.6)
Hemoglobin: 15.3 g/dL (ref 11.1–15.9)
Immature Grans (Abs): 0 10*3/uL (ref 0.0–0.1)
Immature Granulocytes: 0 %
Lymphocytes Absolute: 2.4 10*3/uL (ref 0.7–3.1)
Lymphs: 39 %
MCH: 29.3 pg (ref 26.6–33.0)
MCHC: 33.1 g/dL (ref 31.5–35.7)
MCV: 89 fL (ref 79–97)
Monocytes Absolute: 0.4 10*3/uL (ref 0.1–0.9)
Monocytes: 7 %
Neutrophils Absolute: 3.1 10*3/uL (ref 1.4–7.0)
Neutrophils: 49 %
Platelets: 192 10*3/uL (ref 150–450)
RBC: 5.22 x10E6/uL (ref 3.77–5.28)
RDW: 13.3 % (ref 11.7–15.4)
WBC: 6.2 10*3/uL (ref 3.4–10.8)

## 2019-12-23 LAB — CMP14+EGFR
ALT: 36 IU/L — ABNORMAL HIGH (ref 0–32)
AST: 29 IU/L (ref 0–40)
Albumin/Globulin Ratio: 1.1 — ABNORMAL LOW (ref 1.2–2.2)
Albumin: 4.1 g/dL (ref 3.8–4.9)
Alkaline Phosphatase: 208 IU/L — ABNORMAL HIGH (ref 39–117)
BUN/Creatinine Ratio: 24 — ABNORMAL HIGH (ref 9–23)
BUN: 14 mg/dL (ref 6–24)
Bilirubin Total: 0.3 mg/dL (ref 0.0–1.2)
CO2: 22 mmol/L (ref 20–29)
Calcium: 9.4 mg/dL (ref 8.7–10.2)
Chloride: 96 mmol/L (ref 96–106)
Creatinine, Ser: 0.58 mg/dL (ref 0.57–1.00)
GFR calc Af Amer: 119 mL/min/{1.73_m2} (ref >59)
GFR calc non Af Amer: 103 mL/min/{1.73_m2} (ref >59)
Globulin, Total: 3.6 g/dL (ref 1.5–4.5)
Glucose: 302 mg/dL — ABNORMAL HIGH (ref 65–99)
Potassium: 4 mmol/L (ref 3.5–5.2)
Sodium: 133 mmol/L — ABNORMAL LOW (ref 134–144)
Total Protein: 7.7 g/dL (ref 6.0–8.5)

## 2019-12-23 LAB — LIPID PANEL
Chol/HDL Ratio: 14.4 ratio — ABNORMAL HIGH (ref 0.0–4.4)
Cholesterol, Total: 331 mg/dL — ABNORMAL HIGH (ref 100–199)
HDL: 23 mg/dL — ABNORMAL LOW (ref >39)
Triglycerides: 1537 mg/dL (ref 0–149)

## 2019-12-23 MED ORDER — TRUEPLUS LANCETS 28G MISC: 6 refills | Status: DC

## 2019-12-23 MED FILL — GLIMEPIRIDE 4 MG TABS: 4 | 30 days supply | Qty: 30 | Fill #0

## 2019-12-23 MED FILL — LISINOPRIL 2.5 MG TABLET: 2.5 | 30 days supply | Qty: 30 | Fill #1

## 2019-12-23 MED FILL — JANUMET 50-1,000 MG TABLET: 50-1000 | 30 days supply | Qty: 60 | Fill #2

## 2019-12-23 MED FILL — ?ATORVASTATIN 40MG TABLET: 40 | 30 days supply | Qty: 30 | Fill #1

## 2019-12-23 MED FILL — TRUEplus LANCETS 28G MISC: 25 days supply | Qty: 100 | Fill #0

## 2019-12-23 MED FILL — ?EZETIMIBE 10 MG TABLET: 10 | 30 days supply | Qty: 30 | Fill #0

## 2019-12-23 MED FILL — TRUE METRIX TEST STRIP: 25 days supply | Qty: 100 | Fill #1

## 2019-12-24 ENCOUNTER — Other Ambulatory Visit (INDEPENDENT_AMBULATORY_CARE_PROVIDER_SITE_OTHER): Payer: Self-pay | Admitting: Primary Care

## 2019-12-24 DIAGNOSIS — E7841 Elevated Lipoprotein(a): Secondary | ICD-10-CM

## 2019-12-24 MED ORDER — FENOFIBRATE 160 MG PO TABS: 160.0000 mg | ORAL_TABLET | Freq: Every day | ORAL | 0 refills | Status: DC

## 2019-12-24 MED ORDER — ATORVASTATIN CALCIUM 80 MG PO TABS: 40.0000 mg | ORAL_TABLET | Freq: Every day | ORAL | 0 refills | Status: DC

## 2019-12-24 MED ORDER — EZETIMIBE 10 MG PO TABS: 10.0000 mg | ORAL_TABLET | Freq: Every day | ORAL | 1 refills | Status: DC

## 2019-12-24 MED FILL — ?FENOFIBRATE 160 MG TABS: 160 | 30 days supply | Qty: 30 | Fill #0

## 2019-12-29 ENCOUNTER — Telehealth: Payer: Self-pay | Admitting: Pediatric Intensive Care

## 2019-12-29 NOTE — Telephone Encounter (Signed)
Call to [facility] with [facility]. She is completing a health coaching certificate. CN advised that client needs means for consistently taking her medication and that the client is unable to read. Client will require explanation of any medication plan is Spanish. Shann Medal RN BSN CNP [phone]

## 2019-12-31 ENCOUNTER — Telehealth (INDEPENDENT_AMBULATORY_CARE_PROVIDER_SITE_OTHER): Payer: Self-pay

## 2019-12-31 NOTE — Telephone Encounter (Signed)
-----   Message from Grayce Sessions, NP sent at 12/24/2019 12:26 PM EDT ----- Patient needs to take cholesterol medication or end up with pancreatitis in the hospital she must take zetia 10mg  daily, atorvastatin 80mg  and fenofibrate 160 mg take all 3 medications return in 6 weeks

## 2019-12-31 NOTE — Telephone Encounter (Signed)
Call placed to patient using pacific interpreter [phone]) patient aware that she needs to take her  cholesterol medication or end up with pancreatitis in the hospital she must take zetia 10mg  daily, atorvastatin 80mg  and fenofibrate 160 mg take all 3 medications return in 6 weeks. Scheduled for fasting labs on may 21. , CMA

## 2020-01-17 MED FILL — JANUMET 50-1,000 MG TABLET: 50-1000 | 30 days supply | Qty: 60 | Fill #3

## 2020-01-17 MED FILL — ?EZETIMIBE 10 MG TABLET: 10 | 30 days supply | Qty: 30 | Fill #1

## 2020-01-17 MED FILL — ATORVASTATIN 80 MG TABLET: 80 | 30 days supply | Qty: 15 | Fill #0

## 2020-01-17 MED FILL — GLIMEPIRIDE 4 MG TABS: 4 | 30 days supply | Qty: 30 | Fill #1

## 2020-01-17 MED FILL — LISINOPRIL 2.5 MG TABLET: 2.5 | 30 days supply | Qty: 30 | Fill #2

## 2020-02-04 ENCOUNTER — Other Ambulatory Visit (INDEPENDENT_AMBULATORY_CARE_PROVIDER_SITE_OTHER): Payer: Self-pay

## 2020-02-07 ENCOUNTER — Other Ambulatory Visit: Payer: Self-pay

## 2020-02-07 ENCOUNTER — Other Ambulatory Visit (INDEPENDENT_AMBULATORY_CARE_PROVIDER_SITE_OTHER): Payer: Self-pay

## 2020-02-07 DIAGNOSIS — E781 Pure hyperglyceridemia: Secondary | ICD-10-CM

## 2020-02-08 LAB — LIPID PANEL
Chol/HDL Ratio: 4 ratio (ref 0.0–4.4)
Cholesterol, Total: 153 mg/dL (ref 100–199)
HDL: 38 mg/dL — ABNORMAL LOW (ref >39)
LDL Chol Calc (NIH): 72 mg/dL (ref 0–99)
Triglycerides: 268 mg/dL — ABNORMAL HIGH (ref 0–149)
VLDL Cholesterol Cal: 43 mg/dL — ABNORMAL HIGH (ref 5–40)

## 2020-02-22 MED FILL — ?BASAGLAR 100 UNITS/ML KWPE: 100 | 24 days supply | Qty: 15 | Fill #1

## 2020-03-24 MED FILL — ?BASAGLAR 100 UNITS/ML KWPE: 100 | 24 days supply | Qty: 15 | Fill #2

## 2020-03-24 MED FILL — TRUEplus LANCETS 28G MISC: 25 days supply | Qty: 100 | Fill #1

## 2020-03-24 MED FILL — TRUEPLUS 5-BEVEL PEN NEEDLE: 31G X 5 MM | 25 days supply | Qty: 100 | Fill #2

## 2020-03-24 MED FILL — GLIMEPIRIDE 4 MG TABS: 4 | 30 days supply | Qty: 30 | Fill #3

## 2020-03-24 MED FILL — ATORVASTATIN 80 MG TABLET: 80 | 30 days supply | Qty: 15 | Fill #2

## 2020-03-24 MED FILL — OMEGA-3 ETHYL ESTERS 1 GM C: 1 | 30 days supply | Qty: 120 | Fill #0

## 2020-03-24 MED FILL — EZETIMIBE 10 MG TAB: 10 | 30 days supply | Qty: 30 | Fill #3

## 2020-03-24 MED FILL — $JANUMET 50-1000 MG TABLET: 50-1000 | 90 days supply | Qty: 180 | Fill #0

## 2020-03-24 MED FILL — TRUE METRIX TEST STRIP: 25 days supply | Qty: 100 | Fill #2

## 2020-03-24 MED FILL — LISINOPRIL 2.5 MG TABLET: 2.5 | 30 days supply | Qty: 30 | Fill #4

## 2020-03-28 ENCOUNTER — Other Ambulatory Visit: Payer: Self-pay

## 2020-03-28 ENCOUNTER — Ambulatory Visit (INDEPENDENT_AMBULATORY_CARE_PROVIDER_SITE_OTHER): Payer: Self-pay | Admitting: Primary Care

## 2020-03-28 ENCOUNTER — Encounter (INDEPENDENT_AMBULATORY_CARE_PROVIDER_SITE_OTHER): Payer: Self-pay | Admitting: Primary Care

## 2020-03-28 VITALS — BP 119/76 | HR 66 | Temp 98.2°F | Ht <= 58 in | Wt 191.6 lb

## 2020-03-28 DIAGNOSIS — E1165 Type 2 diabetes mellitus with hyperglycemia: Secondary | ICD-10-CM

## 2020-03-28 DIAGNOSIS — I10 Essential (primary) hypertension: Secondary | ICD-10-CM

## 2020-03-28 DIAGNOSIS — IMO0002 Reserved for concepts with insufficient information to code with codable children: Secondary | ICD-10-CM

## 2020-03-28 DIAGNOSIS — E118 Type 2 diabetes mellitus with unspecified complications: Secondary | ICD-10-CM

## 2020-03-28 DIAGNOSIS — E7841 Elevated Lipoprotein(a): Secondary | ICD-10-CM

## 2020-03-28 DIAGNOSIS — Z1211 Encounter for screening for malignant neoplasm of colon: Secondary | ICD-10-CM

## 2020-03-28 LAB — POCT GLYCOSYLATED HEMOGLOBIN (HGB A1C): Hemoglobin A1C: 9.4 % — AB (ref 4.0–5.6)

## 2020-03-28 LAB — GLUCOSE, POCT (MANUAL RESULT ENTRY): POC Glucose: 201 mg/dl — AB (ref 70–99)

## 2020-03-28 NOTE — Progress Notes (Signed)
Established Patient Office Visit  Subjective:  Patient ID: Denise Mosley, female    DOB: 31-Aug-1961  Age: 59 y.o. MRN: [id number]  CC:  Chief Complaint  Patient presents with  . Diabetes    HPI Ms Denise Mosley is qa 59 year old female speaks Spanish only interpretor  Denise Mosley [id number] presents for the management of diabetes . Today her A1C is 9.4 from 12.8 3 months ago. Blood pressure is unremarkable .  Past Medical History:  Diagnosis Date  . Bell's palsy   . Diabetes mellitus without complication ([place])   . Hyperlipidemia   . Hypertension     Past Surgical History:  Procedure Laterality Date  . CESAREAN SECTION    . CHOLECYSTECTOMY      Family History  Problem Relation Age of Onset  . Diabetes Mother   . Diabetes Father     Social History   Socioeconomic History  . Marital status: Single    Spouse name: Not on file  . Number of children: Not on file  . Years of education: Not on file  . Highest education level: Not on file  Occupational History  . Not on file  Tobacco Use  . Smoking status: Former Research scientist (life sciences)  . Smokeless tobacco: Never Used  . Tobacco comment: Quit 4 years ago  Substance and Sexual Activity  . Alcohol use: No  . Drug use: No  . Sexual activity: Not on file  Other Topics Concern  . Not on file  Social History Narrative  . Not on file   Social Determinants of Health   Financial Resource Strain:   . Difficulty of Paying Living Expenses:   Food Insecurity:   . Worried About Charity fundraiser in the Last Year:   . Arboriculturist in the Last Year:   Transportation Needs:   . Film/video editor (Medical):   Marland Kitchen Lack of Transportation (Non-Medical):   Physical Activity:   . Days of Exercise per Week:   . Minutes of Exercise per Session:   Stress:   . Feeling of Stress :   Social Connections:   . Frequency of Communication with Friends and Family:   . Frequency of Social Gatherings with Friends and Family:   . Attends Religious  Services:   . Active Member of Clubs or Organizations:   . Attends Archivist Meetings:   Marland Kitchen Marital Status:   Intimate Partner Violence:   . Fear of Current or Ex-Partner:   . Emotionally Abused:   Marland Kitchen Physically Abused:   . Sexually Abused:     Outpatient Medications Prior to Visit  Medication Sig Dispense Refill  . albuterol (VENTOLIN HFA) 108 (90 Base) MCG/ACT inhaler Inhale 2 puffs into the lungs every 6 (six) hours as needed for wheezing or shortness of breath. 6.7 g 1  . atorvastatin (LIPITOR) 80 MG tablet Take 0.5 tablets (40 mg total) by mouth daily. 90 tablet 0  . blood glucose meter kit and supplies Dispense based on patient and insurance preference. Use up to four times daily as directed. (FOR ICD-10 E10.9, E11.9). 1 each 0  . Blood Glucose Monitoring Suppl (TRUE METRIX AIR GLUCOSE METER) w/Device KIT 1 Bag by Does not apply route 3 (three) times daily. 1 kit 0  . Blood Glucose Monitoring Suppl (TRUE METRIX METER) w/Device KIT Use as instructed 1 kit 0  . glimepiride (AMARYL) 4 MG tablet Take 1 tablet (4 mg total) by mouth daily before breakfast. 90 tablet  1  . glucose blood (TRUE METRIX BLOOD GLUCOSE TEST) test strip Use as instructed 100 each 12  . Insulin Glargine (LANTUS) 100 UNIT/ML Solostar Pen Inject 30 Units into the skin 2 (two) times daily. 4 pen 6  . Insulin Pen Needle (B-D UF III MINI PEN NEEDLES) 31G X 5 MM MISC Use as instructed 100 each 5  . lisinopril (ZESTRIL) 2.5 MG tablet Take 1 tablet (2.5 mg total) by mouth daily. 90 tablet 1  . sitaGLIPtin-metformin (JANUMET) 50-1000 MG tablet Take 1 tablet by mouth 2 (two) times daily with a meal. 180 tablet 1  . TRUEplus Lancets 28G MISC Use as instructed to check blood sugar. 100 each 6  . ezetimibe (ZETIA) 10 MG tablet Take 1 tablet (10 mg total) by mouth daily. 90 tablet 1  . fenofibrate 160 MG tablet Take 1 tablet (160 mg total) by mouth daily. 90 tablet 0   No facility-administered medications prior to  visit.    No Known Allergies  ROS Review of Systems  All other systems reviewed and are negative.     Objective:    Physical Exam Vitals reviewed.  Constitutional:      Appearance: She is obese.     Comments: morbid  HENT:     Head: Normocephalic.     Nose: Nose normal.  Cardiovascular:     Rate and Rhythm: Normal rate and regular rhythm.     Pulses: Normal pulses.     Heart sounds: Normal heart sounds.  Pulmonary:     Effort: Pulmonary effort is normal.     Breath sounds: Normal breath sounds.  Abdominal:     General: Bowel sounds are normal.  Musculoskeletal:        General: Normal range of motion.     Cervical back: Normal range of motion and neck supple.  Skin:    General: Skin is warm and dry.  Neurological:     Mental Status: She is alert and oriented to person, place, and time.  Psychiatric:        Mood and Affect: Mood normal.        Behavior: Behavior normal.        Thought Content: Thought content normal.        Judgment: Judgment normal.     BP 119/76 (BP Location: Right Arm, Patient Position: Sitting, Cuff Size: Normal)   Pulse 66   Temp 98.2 F (36.8 C) (Oral)   Ht 4' 8" (1.422 m)   Wt 191 lb 9.6 oz (86.9 kg)   SpO2 97%   BMI 42.96 kg/m  Wt Readings from Last 3 Encounters:  03/28/20 191 lb 9.6 oz (86.9 kg)  12/22/19 182 lb 9.6 oz (82.8 kg)  09/23/19 187 lb (84.8 kg)     Health Maintenance Due  Topic Date Due  . OPHTHALMOLOGY EXAM  Never done  . COVID-19 Vaccine (1) Never done  . Fecal DNA (Cologuard)  Never done  . MAMMOGRAM  04/29/2016    There are no preventive care reminders to display for this patient.  Lab Results  Component Value Date   TSH 2.610 08/14/2017   Lab Results  Component Value Date   WBC 6.2 12/22/2019   HGB 15.3 12/22/2019   HCT 46.2 12/22/2019   MCV 89 12/22/2019   PLT 192 12/22/2019   Lab Results  Component Value Date   NA 133 (L) 12/22/2019   K 4.0 12/22/2019   CO2 22 12/22/2019   GLUCOSE 302 (H)  12/22/2019   BUN 14 12/22/2019   CREATININE 0.58 12/22/2019   BILITOT 0.3 12/22/2019   ALKPHOS 208 (H) 12/22/2019   AST 29 12/22/2019   ALT 36 (H) 12/22/2019   PROT 7.7 12/22/2019   ALBUMIN 4.1 12/22/2019   CALCIUM 9.4 12/22/2019   ANIONGAP 11 01/23/2017   Lab Results  Component Value Date   CHOL 153 02/07/2020   Lab Results  Component Value Date   HDL 38 (L) 02/07/2020   Lab Results  Component Value Date   LDLCALC 72 02/07/2020   Lab Results  Component Value Date   TRIG 268 (H) 02/07/2020   Lab Results  Component Value Date   CHOLHDL 4.0 02/07/2020   Lab Results  Component Value Date   HGBA1C 9.4 (A) 03/28/2020      Assessment & Plan:  Saman was seen today for diabetes.  Diagnoses and all orders for this visit:  Uncontrolled type 2 diabetes mellitus with complication (HCC) -     HgB A1c 9.4 remains uncontrol but improving 3 months 12.8 She is taking medication as prescribe taking responsibility monitoring her intake of carbs. Very pleased with progress she is making  -     Glucose (CBG) -     Ambulatory referral to Ophthalmology  Colon cancer screening -     Fecal occult blood, imunochemical; Future  Elevated lipoprotein(a) To help with lowering your cholesterol  decrease your fatty foods, red meat, cheese, milk and increase fiber like whole grains and veggies. Triglycerides 0 - 149 mg/dL 268High  1,537High Panic  Continue Zetia 56m and fenofibrate 160 night . Very please with medication compliance numbers speak for themselves    Essential hypertension Unremarkable at goal < 130/80 she is taking her medication as prescribed trying to be more careful of sodium content. Exercises as tolerated. Very please with the progress she is making    No orders of the defined types were placed in this encounter.   Follow-up: Return in about 3 months (around 06/28/2020) for in person DM.    MKerin Perna NP

## 2020-03-28 NOTE — Patient Instructions (Signed)
Pruebas de detección de cáncer colorrectal °Colorectal Cancer Screening ° °Las pruebas de detección de cáncer colorrectal se usan para confirmar o descartar este tipo de cáncer antes de que se desarrollen los síntomas. Colorrectal se refiere al colon y al recto. El colon y el recto se encuentran al final del tubo digestivo y allí se eliminan las deposiciones hacia fuera del cuerpo. °¿Quién debe realizarse la prueba de detección? °Todos los adultos a partir de los 50 años hasta los 75 años deben hacerse pruebas de detección. El médico puede recomendarle las pruebas de detección a partir de los 45 años. Le realizarán pruebas cada 1 a 10 años, según los resultados y el tipo de prueba de detección. °Puede realizarse pruebas de detección a partir de una edad más temprana o con más frecuencia que otras personas, si usted tiene alguno de los siguientes factores de riesgo: °· Antecedentes personales o familiares de cáncer colorrectal o bultos anormales (pólipos). °· Una enfermedad inflamatoria del intestino, como colitis ulcerosa o enfermedad de Crohn. °· Antecedentes de radioterapia en el abdomen o área pélvica para tratamiento de cáncer. °· Síntomas de cáncer colorrectal, como cambios en los hábitos intestinales o sangre en las heces. °· Un tipo de síndrome de cáncer de colon que se transmite de padres a hijos (hereditario), como: °? Síndrome de Lynch. °? Poliposis adenomatosa familiar. °? Síndrome de [place]. °? Síndrome de Peutz-Jeghers. °Las recomendaciones de pruebas de detección para los adultos que tienen entre 75 y 85 años de edad varían según la salud. °¿Cómo se realiza este estudio? °Hay diversos tipos de pruebas de detección de cáncer colorrectal. Pueden hacerle una o más de las siguientes: °· Prueba de sangre oculta en la materia fecal con guayacol. Para realizar esta prueba, se examina una muestra de heces (materia fecal) a fin de detectar sangre oculta (escondida), lo que podría ser un signo de cáncer  colorrectal. °· Prueba inmunoquímica fecal (PIF). Para realizar esta prueba, se examina una muestra de heces a fin de detectar la presencia de sangre, lo que podría ser un signo de cáncer colorrectal. °· Prueba de ácido desoxirribonucleico (ADN) en heces. Para realizar esta prueba, se examina una muestra de heces a fin de detectar la presencia de sangre y cambios en el ADN que podrían provocar cáncer colorrectal. °· Sigmoidoscopía. Durante esta prueba, se utiliza un tubo flexible y delgado con una cámara en el extremo (sigmoidoscopio) para examinar el recto y la parte inferior del colon. °· Colonoscopía. Durante esta prueba, se utiliza un tubo largo y flexible con una cámara en el extremo (colonoscopio) para examinar todo el colon y el recto. Mediante una colonoscopía es posible tomar una muestra de tejido (biopsia) y extirpar pequeños pólipos durante la prueba. °· Colonoscopía virtual. En lugar de un colonoscopio, en este tipo de colonoscopía se utilizan radiografías (exploración por tomografía computarizada (TC)) y computadoras para generar imágenes del colon y el recto. °¿Cuáles son los beneficios de las pruebas de detección? °Las pruebas de detección reducen el riesgo de cáncer colorrectal y pueden ayudar a identificar el cáncer en una etapa temprana, cuando se puede extirpar o tratar con mayor facilidad. Es común que se formen pólipos en el revestimiento del colon, especialmente a medida que envejece. Estos pólipos pueden ser cancerosos o volverse cancerosos con el tiempo. Las pruebas de detección pueden identificar estos pólipos. °¿Cuáles son los riesgos de la prueba de detección? °Cada prueba de detección puede tener diferentes riesgos. °· Las pruebas de muestras de heces tienen menos riesgos que otros   tipos de pruebas de detección. Sin embargo, es posible que deba realizarse más pruebas para confirmar los resultados de una prueba de muestra de heces. °· Las pruebas de detección con radiografías lo exponen a  niveles bajos de radiación, lo que puede aumentar ligeramente el riesgo de cáncer. El beneficio de detectar el cáncer supera el ligero aumento del riesgo. °· Las pruebas de detección como la sigmoidoscopía y la colonoscopía pueden conllevar riesgo de sangrado, daño intestinal, infección o una reacción a los medicamentos administrados durante el examen. °Consulte a su médico para comprender su riesgo de tener cáncer colorrectal y para elaborar un plan de detección que sea adecuado para usted. °Preguntas para hacerle al médico °· ¿Cuándo debo comenzar con las pruebas de detección del cáncer colorrectal? °· ¿Cuál es mi riesgo de tener cáncer colorrectal? °· ¿Con qué frecuencia tengo que realizarme pruebas de detección? °· ¿Qué pruebas de detección tengo que realizarme? °· ¿Cómo obtengo los resultados de la prueba? °· ¿Qué significan los resultados? °Dónde buscar más información °Obtenga más información sobre las pruebas de detección del cáncer colorrectal en los siguientes sitios: °· Sociedad Americana contra el Cáncer (American Cancer Society): www.cancer.org °· Instituto Nacional del Cáncer (National Cancer Institute): www.cancer.gov °Resumen °· Las pruebas de detección de cáncer colorrectal se usan para confirmar o descartar este tipo de cáncer antes de que se desarrollen los síntomas. °· Las pruebas de detección reducen el riesgo de cáncer colorrectal y pueden ayudar a identificar el cáncer en una etapa temprana, cuando se puede extirpar o tratar con mayor facilidad. °· Todos los adultos a partir de los 50 años hasta los 75 años deben hacerse pruebas de detección. El médico puede recomendarle las pruebas de detección a partir de los 45 años. °· Puede realizarse pruebas de detección a partir de una edad más temprana o con más frecuencia que otras personas, si usted tiene determinados factores de riesgo. °· Consulte a su médico para comprender su riesgo de tener cáncer colorrectal y para elaborar un plan de detección  que sea adecuado para usted. °Esta información no tiene como fin reemplazar el consejo del médico. Asegúrese de hacerle al médico cualquier pregunta que tenga. °Document Revised: 10/08/2017 Document Reviewed: 08/01/2017 °Elsevier Patient Education © 2020 [employer] Inc[employer] ° °

## 2020-04-02 MED ORDER — EZETIMIBE 10 MG PO TABS: 10.0000 mg | ORAL_TABLET | Freq: Every day | ORAL | 1 refills | Status: DC

## 2020-04-02 MED ORDER — FENOFIBRATE 160 MG PO TABS: 160.0000 mg | ORAL_TABLET | Freq: Every day | ORAL | 1 refills | Status: DC

## 2020-04-03 MED FILL — FENOFIBRATE 160 MG TABLET: 160 | 30 days supply | Qty: 30 | Fill #0

## 2020-05-16 MED FILL — ATORVASTATIN 80 MG TABLET: 80 | 30 days supply | Qty: 15 | Fill #3

## 2020-05-16 MED FILL — ?EZETIMIBE 10 MG TABLET: 10 | 30 days supply | Qty: 30 | Fill #4

## 2020-05-16 MED FILL — LISINOPRIL 2.5 MG TABLET: 2.5 | 30 days supply | Qty: 30 | Fill #5

## 2020-05-16 MED FILL — GLIMEPIRIDE 4 MG TABS: 4 | 30 days supply | Qty: 30 | Fill #4

## 2020-06-28 ENCOUNTER — Other Ambulatory Visit: Payer: Self-pay

## 2020-06-28 ENCOUNTER — Ambulatory Visit (INDEPENDENT_AMBULATORY_CARE_PROVIDER_SITE_OTHER): Payer: Self-pay | Admitting: Primary Care

## 2020-06-28 ENCOUNTER — Encounter (INDEPENDENT_AMBULATORY_CARE_PROVIDER_SITE_OTHER): Payer: Self-pay | Admitting: Primary Care

## 2020-06-28 ENCOUNTER — Other Ambulatory Visit (INDEPENDENT_AMBULATORY_CARE_PROVIDER_SITE_OTHER): Payer: Self-pay | Admitting: Primary Care

## 2020-06-28 VITALS — BP 117/79 | HR 79 | Temp 98.2°F | Resp 16 | Wt 188.0 lb

## 2020-06-28 DIAGNOSIS — E119 Type 2 diabetes mellitus without complications: Secondary | ICD-10-CM

## 2020-06-28 DIAGNOSIS — IMO0002 Reserved for concepts with insufficient information to code with codable children: Secondary | ICD-10-CM

## 2020-06-28 DIAGNOSIS — E1165 Type 2 diabetes mellitus with hyperglycemia: Secondary | ICD-10-CM

## 2020-06-28 DIAGNOSIS — Z794 Long term (current) use of insulin: Secondary | ICD-10-CM

## 2020-06-28 DIAGNOSIS — E7841 Elevated Lipoprotein(a): Secondary | ICD-10-CM

## 2020-06-28 DIAGNOSIS — E118 Type 2 diabetes mellitus with unspecified complications: Secondary | ICD-10-CM

## 2020-06-28 DIAGNOSIS — Z760 Encounter for issue of repeat prescription: Secondary | ICD-10-CM

## 2020-06-28 LAB — POCT GLYCOSYLATED HEMOGLOBIN (HGB A1C): Hemoglobin A1C: 12 % — AB (ref 4.0–5.6)

## 2020-06-28 LAB — GLUCOSE, POCT (MANUAL RESULT ENTRY): POC Glucose: 346 mg/dl — AB (ref 70–99)

## 2020-06-28 MED ORDER — GLIMEPIRIDE 4 MG PO TABS: 4.0000 mg | ORAL_TABLET | Freq: Every day | ORAL | 1 refills | Status: DC

## 2020-06-28 MED ORDER — INSULIN GLARGINE 100 UNIT/ML SOLOSTAR PEN: 30.0000 [IU] | PEN_INJECTOR | Freq: Two times a day (BID) | SUBCUTANEOUS | 6 refills | Status: DC

## 2020-06-28 MED ORDER — JANUMET 50-1000 MG PO TABS: 1.0000 | ORAL_TABLET | Freq: Two times a day (BID) | ORAL | 1 refills | Status: DC

## 2020-06-28 MED ORDER — ATORVASTATIN CALCIUM 80 MG PO TABS: 80.0000 mg | ORAL_TABLET | Freq: Every day | ORAL | 0 refills | Status: DC

## 2020-06-28 MED ORDER — INSULIN ASPART 100 UNIT/ML ~~LOC~~ SOLN
10.0000 [IU] | Freq: Once | SUBCUTANEOUS | Status: AC
  Administered 2020-06-28: 10 [IU] via SUBCUTANEOUS

## 2020-06-28 MED FILL — ATORVASTATIN CALCIUM 80 MG: 80 | 30 days supply | Qty: 30 | Fill #0

## 2020-06-28 MED FILL — ?BASAGLAR 100 UNITS/ML KWPE: 100 | 30 days supply | Qty: 30 | Fill #0

## 2020-06-28 MED FILL — GLIMEPIRIDE 4 MG TABS: 4 | 30 days supply | Qty: 30 | Fill #0

## 2020-06-28 MED FILL — $JANUMET 50-1000 MG TABLET: 50-1000 | 90 days supply | Qty: 180 | Fill #0

## 2020-06-28 NOTE — Patient Instructions (Signed)
Diabetes mellitus y nutricin, en adultos Diabetes Mellitus and Nutrition, Adult Si sufre de diabetes (diabetes mellitus), es muy importante tener hbitos alimenticios saludables debido a que sus niveles de azcar en la sangre (glucosa) se ven afectados en gran medida por lo que come y bebe. Comer alimentos saludables en las cantidades adecuadas, aproximadamente a la misma hora todos los das, lo ayudar a:  Controlar la glucemia.  Disminuir el riesgo de sufrir una enfermedad cardaca.  Mejorar la presin arterial.  Alcanzar o mantener un peso saludable. Todas las personas que sufren de diabetes son diferentes y cada una tiene necesidades diferentes en cuanto a un plan de alimentacin. El mdico puede recomendarle que trabaje con un especialista en dietas y nutricin (nutricionista) para elaborar el mejor plan para usted. Su plan de alimentacin puede variar segn factores como:  Las caloras que necesita.  Los medicamentos que toma.  Su peso.  Sus niveles de glucemia, presin arterial y colesterol.  Su nivel de actividad.  Otras afecciones que tenga, como enfermedades cardacas o renales. Cmo me afectan los carbohidratos? Los carbohidratos, o hidratos de carbono, afectan su nivel de glucemia ms que cualquier otro tipo de alimento. La ingesta de carbohidratos naturalmente aumenta la cantidad de glucosa en la sangre. El recuento de carbohidratos es un mtodo destinado a llevar un registro de la cantidad de carbohidratos que se consumen. El recuento de carbohidratos es importante para mantener la glucemia a un nivel saludable, especialmente si utiliza insulina o toma determinados medicamentos por va oral para la diabetes. Es importante conocer la cantidad de carbohidratos que se pueden ingerir en cada comida sin correr ningn riesgo. Esto es diferente en cada persona. Su nutricionista puede ayudarlo a calcular la cantidad de carbohidratos que debe ingerir en cada comida y en cada  refrigerio. Entre los alimentos que contienen carbohidratos, se incluyen:  Pan, cereal, arroz, pastas y galletas.  Papas y maz.  Guisantes, frijoles y lentejas.  Leche y yogur.  Frutas y jugo.  Postres, como pasteles, galletas, helado y caramelos. Cmo me afecta el alcohol? El alcohol puede provocar disminuciones sbitas de la glucemia (hipoglucemia), especialmente si utiliza insulina o toma determinados medicamentos por va oral para la diabetes. La hipoglucemia es una afeccin potencialmente mortal. Los sntomas de la hipoglucemia (somnolencia, mareos y confusin) son similares a los sntomas de haber consumido demasiado alcohol. Si el mdico afirma que el alcohol es seguro para usted, siga estas pautas:  Limite el consumo de alcohol a no ms de 1medida por da si es mujer y no est embarazada, y a 2medidas si es hombre. Una medida equivale a 12oz (355ml) de cerveza, 5oz (148ml) de vino o 1oz (44ml) de bebidas alcohlicas de alta graduacin.  No beba con el estmago vaco.  Mantngase hidratado bebiendo agua, refrescos dietticos o t helado sin azcar.  Tenga en cuenta que los refrescos comunes, los jugos y otras bebida para mezclar pueden contener mucha azcar y se deben contar como carbohidratos. Cules son algunos consejos para seguir este plan?  Leer las etiquetas de los alimentos  Comience por leer el tamao de la porcin en la "Informacin nutricional" en las etiquetas de los alimentos envasados y las bebidas. La cantidad de caloras, carbohidratos, grasas y otros nutrientes mencionados en la etiqueta se basan en una porcin del alimento. Muchos alimentos contienen ms de una porcin por envase.  Verifique la cantidad total de gramos (g) de carbohidratos totales en una porcin. Puede calcular la cantidad de porciones de carbohidratos al dividir el   total de carbohidratos por 15. Por ejemplo, si un alimento tiene un total de 30g de carbohidratos, equivale a 2  porciones de carbohidratos.  Verifique la cantidad de gramos (g) de grasas saturadas y grasas trans en una porcin. Escoja alimentos que no contengan grasa o que tengan un bajo contenido.  Verifique la cantidad de miligramos (mg) de sal (sodio) en una porcin. La mayora de las personas deben limitar la ingesta de sodio total a menos de 2300mg por da.  Siempre consulte la informacin nutricional de los alimentos etiquetados como "con bajo contenido de grasa" o "sin grasa". Estos alimentos pueden tener un mayor contenido de azcar agregada o carbohidratos refinados, y deben evitarse.  Hable con su nutricionista para identificar sus objetivos diarios en cuanto a los nutrientes mencionados en la etiqueta. Al ir de compras  Evite comprar alimentos procesados, enlatados o precocinados. Estos alimentos tienden a tener una mayor cantidad de grasa, sodio y azcar agregada.  Compre en la zona exterior de la tienda de comestibles. Esta zona incluye frutas y verduras frescas, granos a granel, carnes frescas y productos lcteos frescos. Al cocinar  Utilice mtodos de coccin a baja temperatura, como hornear, en lugar de mtodos de coccin a alta temperatura, como frer en abundante aceite.  Cocine con aceites saludables, como el aceite de oliva, canola o girasol.  Evite cocinar con manteca, crema o carnes con alto contenido de grasa. Planificacin de las comidas  Coma las comidas y los refrigerios regularmente, preferentemente a la misma hora todos los das. Evite pasar largos perodos de tiempo sin comer.  Consuma alimentos ricos en fibra, como frutas frescas, verduras, frijoles y cereales integrales. Consulte a su nutricionista sobre cuntas porciones de carbohidratos puede consumir en cada comida.  Consuma entre 4 y 6 onzas (oz) de protenas magras por da, como carnes magras, pollo, pescado, huevos o tofu. Una onza de protena magra equivale a: ? 1 onza de carne, pollo o  pescado. ? 1huevo. ?  taza de tofu.  Coma algunos alimentos por da que contengan grasas saludables, como aguacates, frutos secos, semillas y pescado. Estilo de vida  Controle su nivel de glucemia con regularidad.  Haga actividad fsica habitualmente como se lo haya indicado el mdico. Esto puede incluir lo siguiente: ? 150minutos semanales de ejercicio de intensidad moderada o alta. Esto podra incluir caminatas dinmicas, ciclismo o gimnasia acutica. ? Realizar ejercicios de elongacin y de fortalecimiento, como yoga o levantamiento de pesas, por lo menos 2veces por semana.  Tome los medicamentos como se lo haya indicado el mdico.  No consuma ningn producto que contenga nicotina o tabaco, como cigarrillos y cigarrillos electrnicos. Si necesita ayuda para dejar de fumar, consulte al mdico.  Trabaje con un asesor o instructor en diabetes para identificar estrategias para controlar el estrs y cualquier desafo emocional y social. Preguntas para hacerle al mdico  Es necesario que consulte a un instructor en el cuidado de la diabetes?  Es necesario que me rena con un nutricionista?  A qu nmero puedo llamar si tengo preguntas?  Cules son los mejores momentos para controlar la glucemia? Dnde encontrar ms informacin:  Asociacin Estadounidense de la Diabetes (American Diabetes Association): diabetes.org  Academia de Nutricin y Diettica (Academy of Nutrition and Dietetics): www.eatright.org  Instituto Nacional de la Diabetes y las Enfermedades Digestivas y Renales (National Institute of Diabetes and Digestive and Kidney Diseases, NIH): www.niddk.nih.gov Resumen  Un plan de alimentacin saludable lo ayudar a controlar la glucemia y mantener un estilo de vida saludable.    Trabajar con un especialista en dietas y nutricin (nutricionista) puede ayudarlo a elaborar el mejor plan de alimentacin para usted.  Tenga en cuenta que los carbohidratos (hidratos de  carbono) y el alcohol tienen efectos inmediatos en sus niveles de glucemia. Es importante contar los carbohidratos que ingiere y consumir alcohol con prudencia. Esta informacin no tiene como fin reemplazar el consejo del mdico. Asegrese de hacerle al mdico cualquier pregunta que tenga. Document Revised: 05/13/2017 Document Reviewed: 12/23/2016 Elsevier Patient Education  2020 [employer]  

## 2020-06-28 NOTE — Progress Notes (Signed)
Follow up DM

## 2020-06-28 NOTE — Progress Notes (Signed)
Established Patient Office Visit  Subjective:  Patient ID: Denise Mosley, female    DOB: 1960-11-30  Age: 59 y.o. MRN: [id number]  CC:  Chief Complaint  Patient presents with   Diabetes    HPI Denise Mosley is a 59 year old Hispanic female Speaks Spanish only (interputor Denise Mosley [phone])  presents for management of diabetes . She has been receiving boxes of free foods with fruits, meats, whole milk (red top)  and vegetables   Past Medical History:  Diagnosis Date   Bell's palsy    Diabetes mellitus without complication ([place])    Hyperlipidemia    Hypertension     Past Surgical History:  Procedure Laterality Date   CESAREAN SECTION     CHOLECYSTECTOMY      Family History  Problem Relation Age of Onset   Diabetes Mother    Diabetes Father     Social History   Socioeconomic History   Marital status: Single    Spouse name: Not on file   Number of children: Not on file   Years of education: Not on file   Highest education level: Not on file  Occupational History   Not on file  Tobacco Use   Smoking status: Former Smoker   Smokeless tobacco: Never Used   Tobacco comment: Quit 4 years ago  Substance and Sexual Activity   Alcohol use: No   Drug use: No   Sexual activity: Not on file  Other Topics Concern   Not on file  Social History Narrative   Not on file   Social Determinants of Health   Financial Resource Strain:    Difficulty of Paying Living Expenses: Not on file  Food Insecurity:    Worried About Charity fundraiser in the Last Year: Not on file   [employer] of Food in the Last Year: Not on file  Transportation Needs:    Lack of Transportation (Medical): Not on file   Lack of Transportation (Non-Medical): Not on file  Physical Activity:    Days of Exercise per Week: Not on file   Minutes of Exercise per Session: Not on file  Stress:    Feeling of Stress : Not on file  Social Connections:    Frequency of  Communication with Friends and Family: Not on file   Frequency of Social Gatherings with Friends and Family: Not on file   Attends Religious Services: Not on file   Active Member of Clubs or Organizations: Not on file   Attends Archivist Meetings: Not on file   Marital Status: Not on file  Intimate Partner Violence:    Fear of Current or Ex-Partner: Not on file   Emotionally Abused: Not on file   Physically Abused: Not on file   Sexually Abused: Not on file    Outpatient Medications Prior to Visit  Medication Sig Dispense Refill   albuterol (VENTOLIN HFA) 108 (90 Base) MCG/ACT inhaler Inhale 2 puffs into the lungs every 6 (six) hours as needed for wheezing or shortness of breath. 6.7 g 1   ezetimibe (ZETIA) 10 MG tablet Take 1 tablet (10 mg total) by mouth daily. 90 tablet 1   fenofibrate 160 MG tablet Take 1 tablet (160 mg total) by mouth daily. 90 tablet 1   lisinopril (ZESTRIL) 2.5 MG tablet Take 1 tablet (2.5 mg total) by mouth daily. 90 tablet 1   atorvastatin (LIPITOR) 80 MG tablet Take 0.5 tablets (40 mg total) by mouth daily.  90 tablet 0   Insulin Glargine (LANTUS) 100 UNIT/ML Solostar Pen Inject 30 Units into the skin 2 (two) times daily. 4 pen 6   sitaGLIPtin-metformin (JANUMET) 50-1000 MG tablet Take 1 tablet by mouth 2 (two) times daily with a meal. 180 tablet 1   blood glucose meter kit and supplies Dispense based on patient and insurance preference. Use up to four times daily as directed. (FOR ICD-10 E10.9, E11.9). 1 each 0   Blood Glucose Monitoring Suppl (TRUE METRIX AIR GLUCOSE METER) w/Device KIT 1 Bag by Does not apply route 3 (three) times daily. 1 kit 0   Blood Glucose Monitoring Suppl (TRUE METRIX METER) w/Device KIT Use as instructed 1 kit 0   glucose blood (TRUE METRIX BLOOD GLUCOSE TEST) test strip Use as instructed 100 each 12   Insulin Pen Needle (B-D UF III MINI PEN NEEDLES) 31G X 5 MM MISC Use as instructed 100 each 5   TRUEplus  Lancets 28G MISC Use as instructed to check blood sugar. 100 each 6   glimepiride (AMARYL) 4 MG tablet Take 1 tablet (4 mg total) by mouth daily before breakfast. 90 tablet 1   No facility-administered medications prior to visit.    No Known Allergies  ROS Review of Systems  All other systems reviewed and are negative.     Objective:    Physical Exam  BP 117/79    Pulse 79    Temp 98.2 F (36.8 C)    Resp 16    Wt 188 lb (85.3 kg)    SpO2 97%    BMI 42.15 kg/m  Wt Readings from Last 3 Encounters:  06/28/20 188 lb (85.3 kg)  03/28/20 191 lb 9.6 oz (86.9 kg)  12/22/19 182 lb 9.6 oz (82.8 kg)     Health Maintenance Due  Topic Date Due   OPHTHALMOLOGY EXAM  Never done   COVID-19 Vaccine (1) Never done   Fecal DNA (Cologuard)  Never done   MAMMOGRAM  04/29/2016   FOOT EXAM  06/24/2020   PAP SMEAR-Modifier  08/14/2020    There are no preventive care reminders to display for this patient.  Lab Results  Component Value Date   TSH 2.610 08/14/2017   Lab Results  Component Value Date   WBC 6.2 12/22/2019   HGB 15.3 12/22/2019   HCT 46.2 12/22/2019   MCV 89 12/22/2019   PLT 192 12/22/2019   Lab Results  Component Value Date   NA 133 (L) 12/22/2019   K 4.0 12/22/2019   CO2 22 12/22/2019   GLUCOSE 302 (H) 12/22/2019   BUN 14 12/22/2019   CREATININE 0.58 12/22/2019   BILITOT 0.3 12/22/2019   ALKPHOS 208 (H) 12/22/2019   AST 29 12/22/2019   ALT 36 (H) 12/22/2019   PROT 7.7 12/22/2019   ALBUMIN 4.1 12/22/2019   CALCIUM 9.4 12/22/2019   ANIONGAP 11 01/23/2017   Lab Results  Component Value Date   CHOL 153 02/07/2020   Lab Results  Component Value Date   HDL 38 (L) 02/07/2020   Lab Results  Component Value Date   LDLCALC 72 02/07/2020   Lab Results  Component Value Date   TRIG 268 (H) 02/07/2020   Lab Results  Component Value Date   CHOLHDL 4.0 02/07/2020   Lab Results  Component Value Date   HGBA1C 12.0 (A) 06/28/2020       Assessment & Plan:   Denise Mosley was seen today for diabetes.  Diagnoses and all orders for this  visit:  Uncontrolled type 2 diabetes mellitus with complication (HCC) V0J has increased to 12.03 months ago 9.4 this is contributed to patient receiving boxes of food.  Not realizing all items received were not necessarily healthy for diabetic.  Diabetic teaching regarding diet and exercise as tolerated Medications adjusted to increasing Lantus to 30 units twice a day and adding Janumet 50/1000 twice daily,  -     Glucose (CBG) -     HgB A1c -     insulin aspart (novoLOG) injection 10 Units  Type 2 diabetes mellitus without complication, with long-term current use of insulin (HCC) -     sitaGLIPtin-metformin (JANUMET) 50-1000 MG tablet; Take 1 tablet by mouth 2 (two) times daily with a meal. -     insulin glargine (LANTUS) 100 UNIT/ML Solostar Pen; Inject 30 Units into the skin 2 (two) times daily. -     glimepiride (AMARYL) 4 MG tablet; Take 1 tablet (4 mg total) by mouth daily before breakfast.  Elevated lipoprotein(a) -     atorvastatin (LIPITOR) 80 MG tablet; Take 1 tablet (80 mg total) by mouth daily.  Medication refill -     sitaGLIPtin-metformin (JANUMET) 50-1000 MG tablet; Take 1 tablet by mouth 2 (two) times daily with a meal. -     insulin glargine (LANTUS) 100 UNIT/ML Solostar Pen; Inject 30 Units into the skin 2 (two) times daily. -     glimepiride (AMARYL) 4 MG tablet; Take 1 tablet (4 mg total) by mouth daily before breakfast. -     atorvastatin (LIPITOR) 80 MG tablet; Take 1 tablet (80 mg total) by mouth daily.    Meds ordered this encounter  Medications   sitaGLIPtin-metformin (JANUMET) 50-1000 MG tablet    Sig: Take 1 tablet by mouth 2 (two) times daily with a meal.    Dispense:  180 tablet    Refill:  1   insulin glargine (LANTUS) 100 UNIT/ML Solostar Pen    Sig: Inject 30 Units into the skin 2 (two) times daily.    Dispense:  30 mL    Refill:  6    Please disregard  previous rx for 60 units.   glimepiride (AMARYL) 4 MG tablet    Sig: Take 1 tablet (4 mg total) by mouth daily before breakfast.    Dispense:  90 tablet    Refill:  1   atorvastatin (LIPITOR) 80 MG tablet    Sig: Take 1 tablet (80 mg total) by mouth daily.    Dispense:  90 tablet    Refill:  0   insulin aspart (novoLOG) injection 10 Units    Follow-up: Return in about 3 months (around 09/28/2020) for fasting labs/ DM f/u.    [name], NP

## 2020-07-03 MED FILL — TRUE METRIX TEST STRIP: 25 days supply | Qty: 100 | Fill #3

## 2020-09-27 ENCOUNTER — Telehealth (INDEPENDENT_AMBULATORY_CARE_PROVIDER_SITE_OTHER): Payer: Self-pay | Admitting: Primary Care

## 2020-09-27 ENCOUNTER — Other Ambulatory Visit (INDEPENDENT_AMBULATORY_CARE_PROVIDER_SITE_OTHER): Payer: Self-pay | Admitting: Primary Care

## 2020-09-27 DIAGNOSIS — E1121 Type 2 diabetes mellitus with diabetic nephropathy: Secondary | ICD-10-CM

## 2020-09-27 DIAGNOSIS — E781 Pure hyperglyceridemia: Secondary | ICD-10-CM

## 2020-09-27 DIAGNOSIS — E119 Type 2 diabetes mellitus without complications: Secondary | ICD-10-CM

## 2020-09-27 DIAGNOSIS — I10 Essential (primary) hypertension: Secondary | ICD-10-CM

## 2020-09-27 DIAGNOSIS — Z794 Long term (current) use of insulin: Secondary | ICD-10-CM

## 2020-09-27 NOTE — Telephone Encounter (Signed)
Called patient using interpreter services and LVM to let her know that her visit for tomorrow had been switched to virtual due to unforseen circumstances in the office. Advised patient that she could come into the office for labs and then would speak with provider over the phone. Advised patient to call back [phone] with any questions.

## 2020-09-28 ENCOUNTER — Ambulatory Visit (INDEPENDENT_AMBULATORY_CARE_PROVIDER_SITE_OTHER): Payer: Self-pay | Admitting: Primary Care

## 2020-09-28 ENCOUNTER — Other Ambulatory Visit (INDEPENDENT_AMBULATORY_CARE_PROVIDER_SITE_OTHER): Payer: Self-pay | Admitting: Primary Care

## 2020-09-28 ENCOUNTER — Other Ambulatory Visit: Payer: Self-pay

## 2020-09-28 DIAGNOSIS — Z760 Encounter for issue of repeat prescription: Secondary | ICD-10-CM

## 2020-09-28 DIAGNOSIS — E1121 Type 2 diabetes mellitus with diabetic nephropathy: Secondary | ICD-10-CM

## 2020-09-28 DIAGNOSIS — Z794 Long term (current) use of insulin: Secondary | ICD-10-CM

## 2020-09-28 DIAGNOSIS — E781 Pure hyperglyceridemia: Secondary | ICD-10-CM

## 2020-09-28 DIAGNOSIS — E119 Type 2 diabetes mellitus without complications: Secondary | ICD-10-CM

## 2020-09-28 DIAGNOSIS — E7841 Elevated Lipoprotein(a): Secondary | ICD-10-CM

## 2020-09-28 MED ORDER — ATORVASTATIN CALCIUM 80 MG PO TABS: 80.0000 mg | ORAL_TABLET | Freq: Every day | ORAL | 0 refills | Status: DC

## 2020-09-28 MED ORDER — GLIMEPIRIDE 4 MG PO TABS: 4.0000 mg | ORAL_TABLET | Freq: Every day | ORAL | 1 refills | Status: DC

## 2020-09-28 MED ORDER — ALBUTEROL SULFATE HFA 108 (90 BASE) MCG/ACT IN AERS: 2.0000 | INHALATION_SPRAY | Freq: Four times a day (QID) | RESPIRATORY_TRACT | 1 refills | Status: DC | PRN

## 2020-09-28 MED ORDER — LISINOPRIL 2.5 MG PO TABS
2.5000 mg | ORAL_TABLET | Freq: Every day | ORAL | 1 refills | Status: DC
Start: 2020-09-28 — End: 2020-09-28

## 2020-09-28 MED ORDER — INSULIN GLARGINE 100 UNIT/ML SOLOSTAR PEN
30.0000 [IU] | PEN_INJECTOR | Freq: Two times a day (BID) | SUBCUTANEOUS | 6 refills | Status: DC
Start: 2020-09-28 — End: 2020-12-27

## 2020-09-28 MED ORDER — JANUMET 50-1000 MG PO TABS
1.0000 | ORAL_TABLET | Freq: Two times a day (BID) | ORAL | 1 refills | Status: DC
  Filled 2021-01-10: qty 180, 90d supply, fill #0

## 2020-09-28 MED FILL — ?BASAGLAR 100 UNITS/ML KWPE: 100 | 25 days supply | Qty: 15 | Fill #0

## 2020-09-28 MED FILL — !JANUMET 50-1000MG TABLET: 50-1000 | 30 days supply | Qty: 60 | Fill #0

## 2020-09-28 MED FILL — ATORVASTATIN CALCIUM 80 MG: 80 | 30 days supply | Qty: 30 | Fill #0

## 2020-09-28 MED FILL — ALBUTEROL SULFATE HFA 108 (: 108 (90 BAS | 25 days supply | Qty: 18 | Fill #0

## 2020-09-28 MED FILL — GLIMEPIRIDE 4 MG TABS: 4 | 30 days supply | Qty: 30 | Fill #0

## 2020-09-28 MED FILL — LISINOPRIL 2.5 MG TABLET: 2.5 | 30 days supply | Qty: 30 | Fill #0

## 2020-09-28 NOTE — Progress Notes (Signed)
Established Patient Office Visit  Subjective:  Patient ID: Denise Mosley, female    DOB: 27-Jan-1961  Age: 60 y.o. MRN: [id number]  CC: No chief complaint on file. Debroah Baller [id number] interpretor   HPI Denise Mosley is a 59 year old female who presents for the management of type 2 diabetes .She denies ,Hypoglycemic episodes:no, Polydipsia/polyuria: no Visual disturbance: no, Chest pain: no, Paresthesias: yes.   Past Medical History:  Diagnosis Date  . Bell's palsy   . Diabetes mellitus without complication ([place])   . Hyperlipidemia   . Hypertension     Past Surgical History:  Procedure Laterality Date  . CESAREAN SECTION    . CHOLECYSTECTOMY      Family History  Problem Relation Age of Onset  . Diabetes Mother   . Diabetes Father     Social History   Socioeconomic History  . Marital status: Single    Spouse name: Not on file  . Number of children: Not on file  . Years of education: Not on file  . Highest education level: Not on file  Occupational History  . Not on file  Tobacco Use  . Smoking status: Former Research scientist (life sciences)  . Smokeless tobacco: Never Used  . Tobacco comment: Quit 4 years ago  Substance and Sexual Activity  . Alcohol use: No  . Drug use: No  . Sexual activity: Not on file  Other Topics Concern  . Not on file  Social History Narrative  . Not on file   Social Determinants of Health   Financial Resource Strain: Not on file  Food Insecurity: Not on file  Transportation Needs: Not on file  Physical Activity: Not on file  Stress: Not on file  Social Connections: Not on file  Intimate Partner Violence: Not on file    Outpatient Medications Prior to Visit  Medication Sig Dispense Refill  . albuterol (VENTOLIN HFA) 108 (90 Base) MCG/ACT inhaler Inhale 2 puffs into the lungs every 6 (six) hours as needed for wheezing or shortness of breath. 6.7 g 1  . atorvastatin (LIPITOR) 80 MG tablet Take 1 tablet (80 mg total) by mouth daily. 90 tablet 0  . blood  glucose meter kit and supplies Dispense based on patient and insurance preference. Use up to four times daily as directed. (FOR ICD-10 E10.9, E11.9). 1 each 0  . Blood Glucose Monitoring Suppl (TRUE METRIX AIR GLUCOSE METER) w/Device KIT 1 Bag by Does not apply route 3 (three) times daily. 1 kit 0  . Blood Glucose Monitoring Suppl (TRUE METRIX METER) w/Device KIT Use as instructed 1 kit 0  . ezetimibe (ZETIA) 10 MG tablet Take 1 tablet (10 mg total) by mouth daily. 90 tablet 1  . fenofibrate 160 MG tablet Take 1 tablet (160 mg total) by mouth daily. 90 tablet 1  . glimepiride (AMARYL) 4 MG tablet Take 1 tablet (4 mg total) by mouth daily before breakfast. 90 tablet 1  . glucose blood (TRUE METRIX BLOOD GLUCOSE TEST) test strip Use as instructed 100 each 12  . insulin glargine (LANTUS) 100 UNIT/ML Solostar Pen Inject 30 Units into the skin 2 (two) times daily. 30 mL 6  . Insulin Pen Needle (B-D UF III MINI PEN NEEDLES) 31G X 5 MM MISC Use as instructed 100 each 5  . lisinopril (ZESTRIL) 2.5 MG tablet Take 1 tablet (2.5 mg total) by mouth daily. 90 tablet 1  . sitaGLIPtin-metformin (JANUMET) 50-1000 MG tablet Take 1 tablet by mouth 2 (two) times daily with a  meal. 180 tablet 1  . TRUEplus Lancets 28G MISC Use as instructed to check blood sugar. 100 each 6   No facility-administered medications prior to visit.    No Known Allergies  ROS Review of Systems  All other systems reviewed and are negative.     Objective:    Physical Exam Vitals reviewed.  Constitutional:      Appearance: She is obese.  HENT:     Right Ear: External ear normal.     Left Ear: External ear normal.     Nose: Nose normal.  Eyes:     Extraocular Movements: Extraocular movements intact.  Cardiovascular:     Rate and Rhythm: Normal rate and regular rhythm.  Pulmonary:     Effort: Pulmonary effort is normal.     Breath sounds: Normal breath sounds.  Abdominal:     General: Bowel sounds are normal.      Palpations: Abdomen is soft.  Musculoskeletal:        General: Normal range of motion.     Cervical back: Normal range of motion.  Skin:    General: Skin is warm and dry.  Neurological:     Mental Status: She is alert and oriented to person, place, and time.  Psychiatric:        Mood and Affect: Mood normal.        Behavior: Behavior normal.        Thought Content: Thought content normal.        Judgment: Judgment normal.     There were no vitals taken for this visit. Wt Readings from Last 3 Encounters:  06/28/20 188 lb (85.3 kg)  03/28/20 191 lb 9.6 oz (86.9 kg)  12/22/19 182 lb 9.6 oz (82.8 kg)     Health Maintenance Due  Topic Date Due  . COVID-19 Vaccine (1) Never done  . OPHTHALMOLOGY EXAM  Never done  . Fecal DNA (Cologuard)  Never done  . MAMMOGRAM  04/29/2016  . FOOT EXAM  06/24/2020  . PAP SMEAR-Modifier  08/14/2020    There are no preventive care reminders to display for this patient.  Lab Results  Component Value Date   TSH 2.610 08/14/2017   Lab Results  Component Value Date   WBC 6.2 12/22/2019   HGB 15.3 12/22/2019   HCT 46.2 12/22/2019   MCV 89 12/22/2019   PLT 192 12/22/2019   Lab Results  Component Value Date   NA 133 (L) 12/22/2019   K 4.0 12/22/2019   CO2 22 12/22/2019   GLUCOSE 302 (H) 12/22/2019   BUN 14 12/22/2019   CREATININE 0.58 12/22/2019   BILITOT 0.3 12/22/2019   ALKPHOS 208 (H) 12/22/2019   AST 29 12/22/2019   ALT 36 (H) 12/22/2019   PROT 7.7 12/22/2019   ALBUMIN 4.1 12/22/2019   CALCIUM 9.4 12/22/2019   ANIONGAP 11 01/23/2017   Lab Results  Component Value Date   CHOL 153 02/07/2020   Lab Results  Component Value Date   HDL 38 (L) 02/07/2020   Lab Results  Component Value Date   LDLCALC 72 02/07/2020   Lab Results  Component Value Date   TRIG 268 (H) 02/07/2020   Lab Results  Component Value Date   CHOLHDL 4.0 02/07/2020   Lab Results  Component Value Date   HGBA1C 12.0 (A) 06/28/2020       Assessment & Plan:  Diagnoses and all orders for this visit:  Type 2 diabetes mellitus without complication, with long-term current   use of insulin (HCC) -     Lipid Panel -     Hemoglobin A1c -     Ambulatory referral to Ophthalmology -     glimepiride (AMARYL) 4 MG tablet; Take 1 tablet (4 mg total) by mouth daily before breakfast. -     insulin glargine (LANTUS) 100 UNIT/ML Solostar Pen; Inject 30 Units into the skin 2 (two) times daily. -     lisinopril (ZESTRIL) 2.5 MG tablet; Take 1 tablet (2.5 mg total) by mouth daily. -     sitaGLIPtin-metformin (JANUMET) 50-1000 MG tablet; Take 1 tablet by mouth 2 (two) times daily with a meal.  Hypertriglyceridemia -     Lipid Panel -     atorvastatin (LIPITOR) 80 MG tablet; Take 1 tablet (80 mg total) by mouth daily.  Controlled type 2 diabetes mellitus with diabetic nephropathy, without long-term current use of insulin (HCC) -     Hemoglobin A1c -     glimepiride (AMARYL) 4 MG tablet; Take 1 tablet (4 mg total) by mouth daily before breakfast. -     insulin glargine (LANTUS) 100 UNIT/ML Solostar Pen; Inject 30 Units into the skin 2 (two) times daily. -     sitaGLIPtin-metformin (JANUMET) 50-1000 MG tablet; Take 1 tablet by mouth 2 (two) times daily with a meal.  Elevated lipoprotein(a) -     atorvastatin (LIPITOR) 80 MG tablet; Take 1 tablet (80 mg total) by mouth daily.  Medication refill -     atorvastatin (LIPITOR) 80 MG tablet; Take 1 tablet (80 mg total) by mouth daily. -     glimepiride (AMARYL) 4 MG tablet; Take 1 tablet (4 mg total) by mouth daily before breakfast. -     insulin glargine (LANTUS) 100 UNIT/ML Solostar Pen; Inject 30 Units into the skin 2 (two) times daily. -     sitaGLIPtin-metformin (JANUMET) 50-1000 MG tablet; Take 1 tablet by mouth 2 (two) times daily with a meal.  Other orders -     albuterol (VENTOLIN HFA) 108 (90 Base) MCG/ACT inhaler; Inhale 2 puffs into the lungs every 6 (six) hours as needed for wheezing  or shortness of breath.   No orders of the defined types were placed in this encounter.   Follow-up: No follow-ups on file.    [name], NP

## 2020-09-29 LAB — LIPID PANEL
Chol/HDL Ratio: 9 ratio — ABNORMAL HIGH (ref 0.0–4.4)
Cholesterol, Total: 298 mg/dL — ABNORMAL HIGH (ref 100–199)
HDL: 33 mg/dL — ABNORMAL LOW (ref >39)
Triglycerides: 1019 mg/dL (ref 0–149)

## 2020-09-29 LAB — HEMOGLOBIN A1C
Est. average glucose Bld gHb Est-mCnc: 315 mg/dL
Hgb A1c MFr Bld: 12.6 % — ABNORMAL HIGH (ref 4.8–5.6)

## 2020-10-09 ENCOUNTER — Ambulatory Visit: Payer: Self-pay | Admitting: Pharmacist

## 2020-10-13 ENCOUNTER — Encounter (INDEPENDENT_AMBULATORY_CARE_PROVIDER_SITE_OTHER): Payer: Self-pay

## 2020-11-29 ENCOUNTER — Other Ambulatory Visit (INDEPENDENT_AMBULATORY_CARE_PROVIDER_SITE_OTHER): Payer: Self-pay | Admitting: Primary Care

## 2020-11-29 DIAGNOSIS — E7841 Elevated Lipoprotein(a): Secondary | ICD-10-CM

## 2020-11-29 DIAGNOSIS — E1165 Type 2 diabetes mellitus with hyperglycemia: Secondary | ICD-10-CM

## 2020-11-29 DIAGNOSIS — IMO0002 Reserved for concepts with insufficient information to code with codable children: Secondary | ICD-10-CM

## 2020-11-29 NOTE — Telephone Encounter (Signed)
Future visit in 4 weeks  

## 2020-12-27 ENCOUNTER — Encounter (INDEPENDENT_AMBULATORY_CARE_PROVIDER_SITE_OTHER): Payer: Self-pay | Admitting: Primary Care

## 2020-12-27 ENCOUNTER — Ambulatory Visit (INDEPENDENT_AMBULATORY_CARE_PROVIDER_SITE_OTHER): Payer: Self-pay | Admitting: Primary Care

## 2020-12-27 ENCOUNTER — Other Ambulatory Visit: Payer: Self-pay

## 2020-12-27 VITALS — BP 112/71 | HR 66 | Temp 97.3°F | Ht <= 58 in | Wt 189.8 lb

## 2020-12-27 DIAGNOSIS — E119 Type 2 diabetes mellitus without complications: Secondary | ICD-10-CM

## 2020-12-27 DIAGNOSIS — Z1231 Encounter for screening mammogram for malignant neoplasm of breast: Secondary | ICD-10-CM

## 2020-12-27 DIAGNOSIS — Z794 Long term (current) use of insulin: Secondary | ICD-10-CM

## 2020-12-27 DIAGNOSIS — Z124 Encounter for screening for malignant neoplasm of cervix: Secondary | ICD-10-CM

## 2020-12-27 DIAGNOSIS — I10 Essential (primary) hypertension: Secondary | ICD-10-CM

## 2020-12-27 LAB — POCT GLYCOSYLATED HEMOGLOBIN (HGB A1C): Hemoglobin A1C: 10.9 % — AB (ref 4.0–5.6)

## 2020-12-27 MED ORDER — BASAGLAR KWIKPEN 100 UNIT/ML ~~LOC~~ SOPN
34.0000 [IU] | PEN_INJECTOR | Freq: Two times a day (BID) | SUBCUTANEOUS | 6 refills | Status: DC
  Filled 2020-12-27: qty 3, 4d supply, fill #0
  Filled 2021-01-10: qty 12, 18d supply, fill #0

## 2020-12-27 MED ORDER — BASAGLAR KWIKPEN 100 UNIT/ML ~~LOC~~ SOPN
34.0000 [IU] | PEN_INJECTOR | Freq: Every day | SUBCUTANEOUS | 6 refills | Status: DC
  Filled 2020-12-27: qty 3, 8d supply, fill #0

## 2020-12-27 NOTE — Progress Notes (Signed)
[facility]   [name] is a 60 y.o.is a Hispanic  female Denise Mosley [phone] interpretor) who presents for an initial/follow up evaluation of Type 2 diabetes mellitus.  Current symptoms/problems include none  Current diabetic medications include . sitaGLIPtin-metformin (JANUMET) 50-1000 MG tablet and glimepiride (AMARYL) 4 MG tablet and insulin ( lantus 30 units bid )  The patient was initially diagnosed with Type 2 diabetes mellitus based on the following criteria:  ADA guidelines  Current monitoring regimen: home blood tests - 2 times daily Home blood sugar records: fasting range: 150-180 Any episodes of hypoglycemia? no  Known diabetic complications: none Cardiovascular risk factors: advanced age (older than 25 for men, 87 for women), diabetes mellitus, dyslipidemia and obesity (BMI >= 30 kg/m2) Eye exam current (within one year): no Weight trend: stable Prior visit with CDE: no Current diet: in general, a "healthy" diet   Current exercise: walking Medication Compliance?  Yes  Is She on ACE inhibitor or angiotensin II receptor blocker?  Yes  lisinopril (Zestril)  Review of Systems  All other systems reviewed and are negative.    Objective:    BP 112/71 (BP Location: Right Arm, Patient Position: Sitting, Cuff Size: Large)   Pulse 66   Temp (!) 97.3 F (36.3 C) (Temporal)   Ht 4\' 8"  (1.422 m)   Wt 189 lb 12.8 oz (86.1 kg)   SpO2 94%   BMI 42.55 kg/m   Physical Exam Vitals reviewed.  Constitutional:      Appearance: She is obese.     Comments: morbid  HENT:     Head: Normocephalic.     Right Ear: Tympanic membrane and external ear normal.     Left Ear: Tympanic membrane and external ear normal.     Nose: Nose normal.  Eyes:     Extraocular Movements: Extraocular movements intact.     Pupils: Pupils are equal, round, and reactive to light.  Cardiovascular:     Rate and Rhythm: Normal rate and regular rhythm.  Pulmonary:     Effort: Pulmonary  effort is normal.     Breath sounds: Normal breath sounds.  Abdominal:     General: Bowel sounds are normal. There is distension.     Palpations: Abdomen is soft.  Musculoskeletal:        General: Normal range of motion.     Cervical back: Normal range of motion and neck supple.  Skin:    General: Skin is warm and dry.  Neurological:     Mental Status: She is alert and oriented to person, place, and time.  Psychiatric:        Mood and Affect: Mood normal.        Behavior: Behavior normal.        Thought Content: Thought content normal.        Judgment: Judgment normal.      Lab Review Glucose (mg/dL)  Date Value  302 (H)  06/25/2019 190 (H)  05/14/2018 114 (H)   Glucose, Bld (mg/dL)  Date Value  05/16/2018 154 (H)  01/22/2017 172 (H)  01/21/2017 160 (H)   CO2 (mmol/L)  Date Value  12/22/2019 22  06/25/2019 21  05/14/2018 25   BUN (mg/dL)  Date Value  05/16/2018 14  06/25/2019 12  05/14/2018 11   Creatinine, Ser (mg/dL)  Date Value  05/16/2018 0.58  06/25/2019 0.63  05/14/2018 0.61   Assessment:    Diabetes Mellitus type II, under poor control.  Denise Mosley was seen today for diabetes.  Diagnoses and all orders for this visit:  Type 2 diabetes mellitus without complication, with long-term current use of insulin (HCC) Janumet 50/1000 mg 1 tablet twice a day and Amaryl 4 mg daily continue with discontinuing Lantus no longer available and change to Basaglar 34 units twice daily.  Continue to monitor carbohydrates rice, potatoes, pasta, tortillas, breads, sweets, and sugars 1.  Rx changes increased Basigalar  2.  Goal A1C (<7), blood pressure (<130/80), and cholesterol (LDL <100). 4. CHO counting diet discussed.  Reviewed CHO amount in various foods and how to read nutrition labels.  Discussed recommended serving sizes.  5.  Recommend check BG 3  times a day 6.  Recommended increase physical activity - goal is 150 minutes per week --     HgB A1c  10.9 -     Insulin Glargine (BASAGLAR KWIKPEN) 100 UNIT/ML; Inject 34 Units into the skin daily.  Essential hypertension Blood pressure well controlled on Lisinopril 2.5 mg for renal protection   Cervical cancer screening Patient given application for BCCP while in clinic asked to take it home and return for someone to help her. Than the application has and faxed to [facility]. Patient aware that [facility] will contact her directly to schedule appointment.  Encounter for screening mammogram for malignant neoplasm of breast Patient given application for BCCP while in clinic asked to take it home and return for someone to help her. Than the application has and faxed to [facility]. Patient aware that [facility] will contact her directly to schedule appointmen   Denise Mosley

## 2021-01-03 ENCOUNTER — Other Ambulatory Visit: Payer: Self-pay

## 2021-01-10 ENCOUNTER — Other Ambulatory Visit: Payer: Self-pay

## 2021-01-12 ENCOUNTER — Other Ambulatory Visit: Payer: Self-pay

## 2021-03-29 ENCOUNTER — Other Ambulatory Visit: Payer: Self-pay

## 2021-03-29 ENCOUNTER — Ambulatory Visit (INDEPENDENT_AMBULATORY_CARE_PROVIDER_SITE_OTHER): Payer: Self-pay | Admitting: Primary Care

## 2021-03-29 ENCOUNTER — Encounter (INDEPENDENT_AMBULATORY_CARE_PROVIDER_SITE_OTHER): Payer: Self-pay | Admitting: Primary Care

## 2021-03-29 VITALS — BP 125/72 | HR 69 | Temp 96.8°F | Ht <= 58 in | Wt 189.8 lb

## 2021-03-29 DIAGNOSIS — Z23 Encounter for immunization: Secondary | ICD-10-CM

## 2021-03-29 DIAGNOSIS — Z794 Long term (current) use of insulin: Secondary | ICD-10-CM

## 2021-03-29 DIAGNOSIS — E119 Type 2 diabetes mellitus without complications: Secondary | ICD-10-CM

## 2021-03-29 LAB — POCT GLYCOSYLATED HEMOGLOBIN (HGB A1C): Hemoglobin A1C: 11.8 % — AB (ref 4.0–5.6)

## 2021-03-29 MED ORDER — TRULICITY 0.75 MG/0.5ML ~~LOC~~ SOAJ
0.7500 mg | SUBCUTANEOUS | 1 refills | Status: DC
Start: 2021-03-29 — End: 2021-06-07
  Filled 2021-03-29: qty 2, 28d supply, fill #0
  Filled 2021-04-23: qty 2, 28d supply, fill #1
  Filled 2021-05-29: qty 2, 28d supply, fill #2

## 2021-03-29 MED ORDER — METFORMIN HCL 1000 MG PO TABS
1000.0000 mg | ORAL_TABLET | Freq: Two times a day (BID) | ORAL | 3 refills | Status: DC
Start: 2021-03-29 — End: 2021-10-01
  Filled 2021-03-29: qty 60, 30d supply, fill #0
  Filled 2021-04-23: qty 60, 30d supply, fill #1
  Filled 2021-05-29: qty 60, 30d supply, fill #2
  Filled 2021-07-16: qty 60, 30d supply, fill #3
  Filled 2021-08-14: qty 60, 30d supply, fill #4
  Filled 2021-09-13: qty 60, 30d supply, fill #5

## 2021-03-29 NOTE — Progress Notes (Signed)
[facility]  [place] [phone]   Denise Mosley is a 60 y.o. female who presents for an follow-up evaluation of Type 2 diabetes mellitus.  Current symptoms/problems include polyuria and have been worsening. Symptoms have been present for 3 month.  Current diabetic medications include Janumet, and Basaglar 24 units twice daily Denise Mosley was initially diagnosed with Type 2 diabetes mellitus based on Denise following criteria: ADA guidelines.  Current monitoring regimen: none Home blood sugar records:  No Any episodes of hypoglycemia? no  Known diabetic complications: cardiovascular disease Cardiovascular risk factors: advanced age (older than 57 for men, 23 for women), diabetes mellitus, and obesity (BMI >= 30 kg/m2) Eye exam current (within one year): no Weight trend: stable Prior visit with CDE: yes -each visit Current diet: in general, an "unhealthy" diet Current exercise: housecleaning Medication Compliance?  Yes   Is Denise Mosley on ACE inhibitor or angiotensin II receptor blocker?  Yes  lisinopril (generic)   Denise following portions of Denise Mosley's history were reviewed and updated as appropriate: allergies, current medications, past family history, past medical history, past social history, past surgical history, and problem list.  Review of Systems  Genitourinary:  Positive for frequency and urgency.  All other systems reviewed and are negative.  Objective:    BP 125/72 (BP Location: Right Arm, Mosley Position: Sitting)   Pulse 69   Temp (!) 96.8 F (36 C) (Temporal)   Ht 4\' 8"  (1.422 m)   Wt 189 lb 12.8 oz (86.1 kg)   SpO2 95%   BMI 42.55 kg/m   Physical Exam Vitals reviewed.  Constitutional:      Appearance: Denise Mosley.  HENT:     Head: Normocephalic.     Right Ear: Tympanic membrane and external ear normal.     Left Ear: Tympanic membrane and external ear normal.     Nose: Nose normal.  Eyes:     Extraocular Movements: Extraocular movements  intact.     Pupils: Pupils are equal, round, and reactive to light.  Cardiovascular:     Rate and Rhythm: Normal rate and regular rhythm.  Pulmonary:     Effort: Pulmonary effort is normal.     Breath sounds: Normal breath sounds.  Abdominal:     General: Bowel sounds are normal. There is distension.     Palpations: Abdomen is soft.  Musculoskeletal:        General: Normal range of motion.     Cervical back: Normal range of motion and neck supple.  Skin:    General: Skin is warm and dry.  Neurological:     Mental Status: Denise Mosley is alert and oriented to person, place, and time.  Psychiatric:        Mood and Affect: Mood normal.        Behavior: Behavior normal.     Lab Review Glucose (mg/dL)  Date Value  302 (H)  06/25/2019 190 (H)  05/14/2018 114 (H)   Glucose, Bld (mg/dL)  Date Value  05/16/2018 154 (H)  01/22/2017 172 (H)  01/21/2017 160 (H)   CO2 (mmol/L)  Date Value  12/22/2019 22  06/25/2019 21  05/14/2018 25   BUN (mg/dL)  Date Value  05/16/2018 14  06/25/2019 12  05/14/2018 11   Creatinine, Ser (mg/dL)  Date Value  05/16/2018 0.58  06/25/2019 0.63  05/14/2018 0.61      Assessment:    Diabetes Mellitus type II, under unsatisfactory control.   Denise Mosley was seen today for  diabetes.  Diagnoses and all orders for this visit:  Type 2 diabetes mellitus without complication, with long-term current use of insulin (HCC) -     HgB A1c -     Dulaglutide (TRULICITY) 0.75 MG/0.5ML SOPN; Inject 0.75 mg into Denise skin once a week. -     metFORMIN (GLUCOPHAGE) 1000 MG tablet; Take 1 tablet (1,000 mg total) by mouth 2 (two) times daily with a meal.  Need for shingles vaccine -     Varicella-zoster vaccine subcutaneous      Plan:    1.  Rx changes:  Added Trulicity 0.75 mL weekly demonstrated how to use and Denise Mosley return demonstration  2.  Education: Reviewed 'ABCs' of diabetes management (respective goals in parentheses):  A1C (<7), blood pressure  (<130/80), and cholesterol (LDL <100). Counseled Mosley on Denise risk factors of co- morbidities with uncontrol diabetes  Complications -diabetic retinopathy, (close your eyes ? What do you see nothing) nephropathy decrease in kidney function- can lead to dialysis-on a machine 3 days a week to filter your kidney, neuropathy- numbness and tinging in your hands and feet,  increase risk of heart attack and stroke, and amputation due to decrease wound healing and circulation. Decrease your risk by taking medication daily as prescribed, monitor carbohydrates- foods that are high in carbohydrates are Denise following rice, potatoes, breads, sugars, and pastas.  Reduction in Denise intake (eating) will assist in lowering your blood sugars. Exercise daily at least 30 minutes daily.   3. CHO counting diet discussed.  Reviewed CHO amount in various foods and how to read nutrition labels.  Discussed recommended serving sizes.   4.  Recommend check BG 3  times a day  5. Recommended increase physical activity - goal is 150 minutes per week  Maretta was seen today for diabetes.  Diagnoses and all orders for this visit:  Type 2 diabetes mellitus without complication, with long-term current use of insulin (HCC) -     HgB A1c -     Dulaglutide (TRULICITY) 0.75 MG/0.5ML SOPN; Inject 0.75 mg into Denise skin once a week. -     metFORMIN (GLUCOPHAGE) 1000 MG tablet; Take 1 tablet (1,000 mg total) by mouth 2 (two) times daily with a meal.  Need for shingles vaccin -     Varicella-zoster vaccine IM (Shingrix)    This note has been created with Education officer, environmental. Any transcriptional errors are unintentional.   Grayce Sessions, NP 03/29/2021, 10:15 AM

## 2021-03-29 NOTE — Patient Instructions (Addendum)
deje de tomar sitaGLIPtin-metformin (JANUMET) 50-1000 MG tableta solo tome metformin 1000 mg dos veces al da Dulaglutide Injection Qu es este medicamento? La DULAGLUTIDA controla los niveles de azcar en la sangre en personas con diabetes tipo 2. Se [place] con cambios en el estilo de vida tales como dieta y ejercicio fsico. Podra disminuir el riesgo de problemas que necesiten tratamiento en el hospital. Estos problemas incluyen ataque cardiaco oaccidente cerebrovascular. Este medicamento puede ser utilizado para otros usos; si tiene Designer, industrial/product preguntaconsulte con su proveedor de atencin mdica o con su farmacutico. MARCAS COMUNES: Trulicity Qu le debo informar a mi profesional de la salud antes de tomar estemedicamento? Necesitan saber si usted presenta alguno de los siguientes problemas osituaciones: tumores endocrinos (neoplasia endcrina mltiple tipo II) o si alguien en su familiar tuvo esos tumores enfermedad ocular, problemas de la visin antecedentes de pancreatitis enfermedad renal enfermedad heptica problemas estomacales o intestinales cncer de tiroides o si alguien en su familia tuvo cncer de tiroides una reaccin alrgica o inusual a la dulaglutida, a otros medicamentos, alimentos, colorantes o conservantes si est embarazada obuscando quedar embarazada si est amamantando a un beb Cmo debo utilizar este medicamento? Este medicamento se inyecta debajo de la piel. Le ensearn cmo prepararlo y administrarlo. selo segn las instrucciones en la etiqueta el mismo da todas las semanas. NO presione repetidamente el inyector. Siga usndolo a menos quesu proveedor de Insurance risk surveyor indique dejar de [place]. Si [place] este medicamento con insulina, debe [employer] medicamento y la insulina por separado. No los mezcle. No se aplique una inyeccin al lado de laotra. Cambie (rote) los sitios de inyeccin con cada inyeccin. Este frmaco viene con INSTRUCCIONES DE USO. Pdale a su  farmacutico que le indique cmo usar [employer]. Lea la informacin atentamente. Hable consu farmacutico o su proveedor de atencin mdica si tiene alguna pregunta. Es importante que deseche las agujas y las jeringas usadas en un recipiente resistente a los pinchazos. No las deseche en la basura. Si no tiene un recipiente resistente a los pinchazos, llame a su farmacutico o proveedor deatencin de la salud para obtenerlo. Su farmacutico le dar una Gua del medicamento especial (MedGuide, nombre en ingls) con cada receta y en cada ocasin que la vuelva a surtir. Asegrese deleer esta informacin cada vez cuidadosamente. Hable con su proveedor de atencin [employer] uso de este medicamento ennios. Puede requerir atencin especial. Sobredosis: Pngase en contacto inmediatamente con un centro toxicolgico o unasala de urgencia si usted cree que haya tomado demasiado medicamento. ATENCIN: [employer] es solo para usted. No comparta este medicamento connadie. Qu sucede si me olvido de una dosis? Si olvida una dosis, adminstrela lo antes posible, a menos que hayan pasado ms de [address]  [address]. Si han pasado ms de 3 das desde el momento de administracin habitual, omita la dosis que se olvid. Administre la prxima dosis a la horahabitual. Qu puede interactuar con este medicamento? otros medicamentos para la diabetes Muchos medicamentos pueden causar Probation officer de azcar en la sangre.Estos incluyen: bebidas con alcohol medicamentos antivirales para el VIH o SIDA aspirina y medicamentos tipo aspirina ciertos medicamentos para la presin arterial, enfermedad cardiaca y frecuencia cardiaca irregular cromo diurticos hormonas femeninas, tales como estrgenos o progestinas, pldoras anticonceptivas fenofibrato gemfibrozil isoniazida lanreotida hormonas masculinas o esteroides anablicos IMAO, tales como Carbex, Eldepryl, Marplan, Nardil y Parnate medicamentos para alergias, asma,  resfros o tos medicamentos para la depresin, ansiedad o trastornos psicticos medicamentos para Publishing copy de peso niacina  nicotina AINE, medicamentos para el dolor y la inflamacin, tales como ibuprofeno o naproxeno octreotida pasireotida pentamidina fenitona probenecid antibiticos del grupo de las quinolonas, tales como ciprofloxacino, levofloxacino y ofloxacino algunos suplementos dietticos a base de hierbas medicamentos esteroideos, tales como la prednisona o la cortisonasulfametoxasol; trimetoprima hormonas tiroideas Algunos medicamentos pueden ocultar los sntomas de advertencia de niveles bajos de azcar en la sangre (hipoglucemia). Es posible que deba monitorear ms atentamente su nivel de azcar en la sangre si est tomando uno de estosmedicamentos. Estos medicamentos incluyen: betabloqueadores, que con frecuencia se usan para la presin arterial alta o problemas cardiacos (algunos ejemplos son atenolol, metoprolol y propranolol)clonidina guanetidina reserpina Puede ser que esta lista no menciona todas las posibles interacciones. Informe a su profesional de [employer] de [employer] productos a base de hierbas, medicamentos de [place] o suplementos nutritivos que est tomando. Si usted fuma, consume bebidas alcohlicas o si utiliza drogas ilegales, indqueselo tambin a su profesional de [employer]. Algunas sustancias pueden interactuar consu medicamento. A qu debo estar atento al usar [employer]? Visite a su proveedor de atencin mdica para que revise regularmente suevolucin. Consulte con su proveedor de atencin mdica si tiene diarrea grave, nuseas y vmitos, o sudoracin intensa. La prdida de demasiado lquido Administrator que sea peligroso usar [employer]. Se monitorizar una prueba llamada Hemoglobina A1C (A1C). Es un anlisis de sangre sencillo. Mide su control del nivel de azcar en la sangre durante losltimos 2 a 3 meses. Se le realizar esta prueba cada 3 a 6  meses. Aprenda a revisar su nivel de azcar en la sangre. Conozca los sntomas delnivel bajo y alto de International aid/development worker en la sangre y cmo controlarlos. Siempre lleve con usted una fuente rpida de azcar por si tiene sntomas de nivel bajo de azcar [employer]. Algunos ejemplos incluyen caramelos duros de azcar o tabletas de glucosa. Asegrese de que otras personas sepan que usted se puede ahogar si come o bebe cuando presenta sntomas graves de [place] bajo de azcar en la sangre, como convulsiones o prdida del conocimiento. Debe obtenerayuda mdica de inmediato. Informe a su proveedor de atencin mdica si tiene niveles altos de Banker. Es posible que deba cambiar la dosis de su medicamento. Si est enfermo o hace ms ejercicio que lo habitual, es posible que necesite cambiarla dosis de su medicamento. No saltee comidas. Pregunte a su proveedor de atencin mdica si debe evitar el alcohol. Muchos productos de venta libre para la tos y Warehouse manager o alcohol. Estos pueden afectar los niveles de Banker. Los inyectores nunca deben compartirse. Incluso si se cambia la aguja, Community education officer se pueden contagiar virus como la hepatitis o el VIH. Use un brazalete o una cadena de identificacin mdica. Lleve consigo una tarjeta que describa su afeccin. Incluya en la tarjeta una lista de losmedicamentos y las dosis que [place]. Qu efectos secundarios puedo tener al [employer] este medicamento? Efectos secundarios que debe informar a su mdico o a su profesional de lasalud tan pronto como sea posible: Therapist, art (erupcin cutnea, comezn/picazn o urticaria; hinchazn de la cara, los labios o la lengua) cambios en la visin diarrea que contina o es grave infeccin (fiebre, escalofros, tos, Engineer, mining de Advertising copywriter, Engineer, mining o dificultad para Geographical information systems officer) lesin renal (dificultad para orinar o cambios en la cantidad de [place]) niveles bajos de azcar en la sangre (ansiedad; confusin; mareos;  aumento del apetito; debilidad o cansancio inusuales; aumento de la sudoracin; temblores; piel fra  y sudorosa; irritabilidad; dolor de [place]; visin borrosa; frecuencia cardiaca rpida; prdida del conocimiento) bulto o hinchazn en el cuello problemas para respirar problemas para tragar Ecologist estomacal inusual vmito Efectos secundarios que generalmente no requieren atencin mdica (infrmelos asu mdico o a su profesional de la salud si persisten o si son molestos): falta o prdida del apetito nuseas dolor, enrojecimiento o Tree surgeon de la inyeccin Puede ser que esta lista no menciona todos los posibles efectos secundarios. Comunquese a su mdico por asesoramiento mdico [employer]. Usted puede informar los efectos secundarios a la FDA por telfono al1-800-FDA-1088. Dnde debo guardar mi medicina? Mantenga fuera del alcance de nios y Neurosurgeon. Refrigeracin (preferido): Guarde los inyectores sin abrir en el refrigerador a una temperatura de [place] 2 y 8 grados Celsius (entre 36 y 46 grados Fahrenheit). Mantenga en el envase original hasta que est listo para usarlo. No congele ni utilice si el medicamento ha [employer] Lee. Proteja de Statistician. Deseche todo el medicamento que no haya utilizado despus de la fecha devencimiento en la etiqueta. Temperatura ambiente: Probation officer se puede guardar a Marketing executive ambiente inferior a 30 grados Celsius (86 grados Fahrenheit) por hasta un total de [address], si fuera necesario. Proteja de Statistician. Evite exponer al calor extremo. Si se almacena a temperatura ambiente, deseche todo el medicamento sin usardespus de [address] o despus de la fecha de vencimiento, lo que suceda primero. Para desechar el medicamento que ya no necesite o que est vencido: Lleve el medicamento a un programa de recuperacin de medicamentos. Consulte con su farmacia o con una entidad reguladora para encontrar un lugar donde llevarlo. Si no puede  Government social research officer, pregntele a Film/video editor oproveedor de atencin mdica cmo desecharlo de IT consultant. ATENCIN: Este folleto es un resumen. Puede ser que no cubra toda la posible informacin. Si usted tiene preguntas acerca de esta medicina, consulte con sumdico, su farmacutico o su profesional de Radiographer, therapeutic.  2022 Elsevier/Gold Standard (2020-10-05 00:00:00)

## 2021-04-04 ENCOUNTER — Other Ambulatory Visit: Payer: Self-pay

## 2021-04-04 ENCOUNTER — Ambulatory Visit: Payer: Self-pay | Attending: Primary Care

## 2021-04-23 ENCOUNTER — Other Ambulatory Visit: Payer: Self-pay

## 2021-04-27 ENCOUNTER — Ambulatory Visit: Payer: Self-pay | Attending: Primary Care | Admitting: Pharmacist

## 2021-04-27 ENCOUNTER — Other Ambulatory Visit: Payer: Self-pay

## 2021-04-27 DIAGNOSIS — IMO0002 Reserved for concepts with insufficient information to code with codable children: Secondary | ICD-10-CM

## 2021-04-27 DIAGNOSIS — E1165 Type 2 diabetes mellitus with hyperglycemia: Secondary | ICD-10-CM

## 2021-04-27 DIAGNOSIS — E118 Type 2 diabetes mellitus with unspecified complications: Secondary | ICD-10-CM

## 2021-04-27 NOTE — Progress Notes (Signed)
    S:    PCP: Marcelino Duster   No chief complaint on file.  Patient arrives in good spirits.  Presents for diabetes evaluation, education, and management. Patient was referred and last seen by Primary Care Provider on 03/29/2021.    Patient reports Diabetes was diagnosed 5 years ago. She has no hx of pancreatitis or thyroid cancer. She tells [place] today that she was taking the combination Janumet for some time before having this changed to Trulicity at her PCP visit on 03/29/2021. Today, she denies any abdominal pain, NV. She did experience some stomach pain and nausea for the first week but this has improved.   PMH: negative for any ACS/CHF/CVA/CKD  Family/Social History:  -Fhx: DM  -Tobacco: former smoker (quit 4 years ago) -Alcohol: none reported   Insurance coverage/medication affordability: self pay  Medication adherence reported. Current diabetes medications include: Basaglar 34 units BID, Trulicity 0.75 mg weekly, metformin 1000 mg BID  Patient denies hypoglycemic events.  Patient reported dietary habits:  - Trying to limit tortillas  - Has switched to wheat bread  - Snacking more on fruit (bananas, papaya)   Patient-reported exercise habits:  - Walks to the park with her kids    Patient denies nocturia (nighttime urination).  Patient denies neuropathy (nerve pain). Patient denies visual changes.    O:  Lab Results  Component Value Date   HGBA1C 11.8 (A) 03/29/2021   There were no vitals filed for this visit.  Lipid Panel     Component Value Date/Time   CHOL 298 (H) 09/28/2020 1153   TRIG 1,019 (HH) 09/28/2020 1153   HDL 33 (L) 09/28/2020 1153   CHOLHDL 9.0 (H) 09/28/2020 1153   LDLCALC Comment (A) 09/28/2020 1153   Home fasting blood sugars: reports readings in the mid 100s since starting Trulicity  2 hour post-meal/random blood sugars: denies any readings >200.  Clinical Atherosclerotic Cardiovascular Disease (ASCVD): No  The 10-year ASCVD risk score Denman George DC  Jr., et al., 2013) is: 13.5%   Values used to calculate the score:     Age: 60 years     Sex: Female     Is Non-Hispanic African American: No     Diabetic: Yes     Tobacco smoker: No     Systolic Blood Pressure: 125 mmHg     Is BP treated: Yes     HDL Cholesterol: 33 mg/dL     Total Cholesterol: 298 mg/dL   A/P: Diabetes longstanding currently uncontrolled based on A1c, however, reported home glycemic control is improving. Patient is able to verbalize appropriate hypoglycemia management plan. Medication adherence appears appropriate. Control is suboptimal due to recent change in therapy - there is still room to titrate Trulicity dose. Since she experienced some GI symptoms with initial dosing and she just picked up a refill, we will plan to have her return in 3 weeks for possible Trulicity titration at that time.  -Continued current regimen with plans to increase Trulicity on f/u in 3 weeks.  -Extensively discussed pathophysiology of diabetes, recommended lifestyle interventions, dietary effects on blood sugar control -Counseled on s/sx of and management of hypoglycemia -Next A1C anticipated 06/2021.   Written patient instructions provided.  Total time in face to face counseling 30 minutes.   Follow up Pharmacist Clinic Visit in 3 weeks.   Butch Penny, PharmD, P[place]y [place], CPP Clinical Pharmacist [facility] [phone]

## 2021-04-30 ENCOUNTER — Ambulatory Visit: Payer: Self-pay | Admitting: Pharmacist

## 2021-05-16 ENCOUNTER — Other Ambulatory Visit: Payer: Self-pay | Admitting: Primary Care

## 2021-05-16 DIAGNOSIS — Z1231 Encounter for screening mammogram for malignant neoplasm of breast: Secondary | ICD-10-CM

## 2021-05-22 ENCOUNTER — Other Ambulatory Visit: Payer: Self-pay

## 2021-05-22 ENCOUNTER — Ambulatory Visit
Admission: RE | Admit: 2021-05-22 | Discharge: 2021-05-22 | Disposition: A | Discharge status: Home / Self Care | Payer: No Typology Code available for payment source | Source: Ambulatory Visit | Attending: Primary Care | Admitting: Primary Care

## 2021-05-22 DIAGNOSIS — Z1231 Encounter for screening mammogram for malignant neoplasm of breast: Secondary | ICD-10-CM

## 2021-05-25 ENCOUNTER — Ambulatory Visit: Payer: Self-pay | Admitting: Pharmacist

## 2021-05-29 ENCOUNTER — Other Ambulatory Visit: Payer: Self-pay

## 2021-06-04 ENCOUNTER — Other Ambulatory Visit: Payer: Self-pay

## 2021-06-07 ENCOUNTER — Other Ambulatory Visit: Payer: Self-pay

## 2021-06-07 ENCOUNTER — Other Ambulatory Visit: Payer: Self-pay | Admitting: Pharmacist

## 2021-06-07 ENCOUNTER — Encounter: Payer: Self-pay | Admitting: Pharmacist

## 2021-06-07 ENCOUNTER — Ambulatory Visit: Payer: Self-pay | Attending: Primary Care | Admitting: Pharmacist

## 2021-06-07 DIAGNOSIS — E1165 Type 2 diabetes mellitus with hyperglycemia: Secondary | ICD-10-CM

## 2021-06-07 DIAGNOSIS — E781 Pure hyperglyceridemia: Secondary | ICD-10-CM

## 2021-06-07 DIAGNOSIS — E118 Type 2 diabetes mellitus with unspecified complications: Secondary | ICD-10-CM

## 2021-06-07 DIAGNOSIS — E7841 Elevated Lipoprotein(a): Secondary | ICD-10-CM

## 2021-06-07 DIAGNOSIS — R739 Hyperglycemia, unspecified: Secondary | ICD-10-CM

## 2021-06-07 DIAGNOSIS — Z760 Encounter for issue of repeat prescription: Secondary | ICD-10-CM

## 2021-06-07 DIAGNOSIS — E119 Type 2 diabetes mellitus without complications: Secondary | ICD-10-CM

## 2021-06-07 DIAGNOSIS — IMO0002 Reserved for concepts with insufficient information to code with codable children: Secondary | ICD-10-CM

## 2021-06-07 MED ORDER — ATORVASTATIN CALCIUM 80 MG PO TABS
ORAL_TABLET | Freq: Every day | ORAL | 0 refills | Status: DC
  Filled 2021-06-07: qty 30, 30d supply, fill #0

## 2021-06-07 MED ORDER — TRULICITY 1.5 MG/0.5ML ~~LOC~~ SOAJ
1.5000 mg | SUBCUTANEOUS | 2 refills | Status: DC
Start: 2021-06-07 — End: 2021-09-13
  Filled 2021-06-07: qty 2, 28d supply, fill #0
  Filled 2021-07-02: qty 2, 28d supply, fill #1
  Filled 2021-08-02: qty 2, 28d supply, fill #2

## 2021-06-07 MED ORDER — LISINOPRIL 2.5 MG PO TABS
ORAL_TABLET | Freq: Every day | ORAL | 1 refills | Status: DC
Start: 2021-06-07 — End: 2021-10-01
  Filled 2021-06-07: qty 30, 30d supply, fill #0
  Filled 2021-07-02: qty 30, 30d supply, fill #1
  Filled 2021-08-14: qty 30, 30d supply, fill #2
  Filled 2021-09-13: qty 30, 30d supply, fill #3

## 2021-06-07 MED ORDER — EZETIMIBE 10 MG PO TABS
ORAL_TABLET | Freq: Every day | ORAL | 1 refills | Status: DC
  Filled 2021-06-07: qty 30, 30d supply, fill #0

## 2021-06-07 MED ORDER — FENOFIBRATE 160 MG PO TABS
160.0000 mg | ORAL_TABLET | Freq: Every day | ORAL | 1 refills | Status: DC
  Filled 2021-06-07: qty 30, 30d supply, fill #0
  Filled 2021-07-02: qty 30, 30d supply, fill #1
  Filled 2021-08-14: qty 30, 30d supply, fill #2
  Filled 2021-09-13: qty 30, 30d supply, fill #3
  Filled 2021-10-17: qty 30, 30d supply, fill #0
  Filled 2021-11-29: qty 30, 30d supply, fill #1

## 2021-06-07 NOTE — Progress Notes (Signed)
S:    PCP: Marcelino Duster   No chief complaint on file.  Patient arrives in good spirits.  Presents for diabetes evaluation, education, and management. Patient was referred and last seen by Primary Care Provider on 03/29/2021. Last seen by pharmacy on 04/27/2021.   Patient reports Diabetes was diagnosed 5 years ago. She has no hx of pancreatitis or thyroid cancer.   At today's visit, patient reports that she thought she was supposed to stop taking Basaglar when she started Trulicity Psychologist, sport and exercise last filled 01/10/21). She was also under the impression she was told to stop taking her cholesterol medications ~6 months ago (last filled 11/2020). She partly blames this on the pharmacy for not filling her medications. I educated her that the pharmacy must at least know the name of the medication or drug class when you request refills.   Today, she denies any abdominal pain, N/V.  Family/Social History:  -Fhx: DM  -Tobacco: former smoker (quit 4 years ago) -Alcohol: none reported   Insurance coverage/medication affordability: self pay  Medication adherence denied. See above for further details.  Current diabetes medications include: Trulicity 0.75 mg weekly (Thursdays), metformin 1000 mg BID, Basaglar 34u BID (not taking)  Patient denies hypoglycemic events.  Patient reported dietary habits:  - Trying to limit tortillas  - Has switched to wheat bread  - Snacking more on fruit (bananas, papaya)   Patient-reported exercise habits:  - Walks to the park with her kids    Patient denies nocturia (nighttime urination).  Patient denies neuropathy (nerve pain). Patient denies visual changes.   O:  Lab Results  Component Value Date   HGBA1C 11.8 (A) 03/29/2021   There were no vitals filed for this visit.  Lipid Panel     Component Value Date/Time   CHOL 298 (H) 09/28/2020 1153   TRIG 1,019 (HH) 09/28/2020 1153   HDL 33 (L) 09/28/2020 1153   CHOLHDL 9.0 (H) 09/28/2020 1153   LDLCALC  Comment (A) 09/28/2020 1153   Home fasting blood sugars: reports 180s, states this morning BG was exactly 180 mg/dL. I do have some concerns patient is accurately reporting home BG. Patient was unaware FBG goal is <130.  2 hour post-meal/random blood sugars: denies any readings >200.  Clinical Atherosclerotic Cardiovascular Disease (ASCVD): No  The 10-year ASCVD risk score (Arnett DK, et al., 2019) is: 13.5%   Values used to calculate the score:     Age: 33 years     Sex: Female     Is Non-Hispanic African American: No     Diabetic: Yes     Tobacco smoker: No     Systolic Blood Pressure: 125 mmHg     Is BP treated: Yes     HDL Cholesterol: 33 mg/dL     Total Cholesterol: 298 mg/dL   A/P: Diabetes longstanding currently uncontrolled based on A1c and self-reported FBG. Patient is able to verbalize appropriate hypoglycemia management plan. Control is suboptimal due to substantial amount of confusion regarding which medications she is supposed to take. I question her reliability on reporting home BG values as she repeatedly endorses her BG is exactly 180 mg/dL. Emphasized the importance of accurately reporting BG as we adjust her medications based on those numbers.   -Increased Trulicity to 1.5 mg weekly -Discontinued basaglar as patient has not taken in months  -Continued metformin 1000 mg BID -Created, translated, and printed off medication list for when she request refills from the pharmacy -Provided patient with BG log in  Spanish -Discussed lipid panel results and told patient to restart atorvastatin 80 mg, zetia 10 mg daily, and fenofibrate 160 mg daily.  -F/up labs: lipids, CMP14- suspect lipid panel to be elevated due to non-compliance.  -Extensively discussed pathophysiology of diabetes, recommended lifestyle interventions, dietary effects on blood sugar control -Counseled on s/sx of and management of hypoglycemia -Next A1C anticipated 06/2021.   Written patient instructions  provided.  Total time in face to face counseling 30 minutes.   Follow up PCP Clinic Visit next month.  Rushie Goltz, Pharm.D. PGY-1 Pharmacy Resident 06/07/2021 1:58 PM

## 2021-06-08 LAB — LIPID PANEL
Chol/HDL Ratio: 7.3 ratio — ABNORMAL HIGH (ref 0.0–4.4)
Cholesterol, Total: 211 mg/dL — ABNORMAL HIGH (ref 100–199)
HDL: 29 mg/dL — ABNORMAL LOW (ref >39)
LDL Chol Calc (NIH): 66 mg/dL (ref 0–99)
Triglycerides: 753 mg/dL (ref 0–149)
VLDL Cholesterol Cal: 116 mg/dL — ABNORMAL HIGH (ref 5–40)

## 2021-06-08 LAB — CMP14+EGFR
ALT: 47 IU/L — ABNORMAL HIGH (ref 0–32)
AST: 44 IU/L — ABNORMAL HIGH (ref 0–40)
Albumin/Globulin Ratio: 1.3 (ref 1.2–2.2)
Albumin: 4.3 g/dL (ref 3.8–4.9)
Alkaline Phosphatase: 161 IU/L — ABNORMAL HIGH (ref 44–121)
BUN/Creatinine Ratio: 24 — ABNORMAL HIGH (ref 9–23)
BUN: 14 mg/dL (ref 6–24)
Bilirubin Total: 0.3 mg/dL (ref 0.0–1.2)
CO2: 24 mmol/L (ref 20–29)
Calcium: 9.7 mg/dL (ref 8.7–10.2)
Chloride: 100 mmol/L (ref 96–106)
Creatinine, Ser: 0.59 mg/dL (ref 0.57–1.00)
Globulin, Total: 3.3 g/dL (ref 1.5–4.5)
Glucose: 188 mg/dL — ABNORMAL HIGH (ref 65–99)
Potassium: 4.3 mmol/L (ref 3.5–5.2)
Sodium: 137 mmol/L (ref 134–144)
Total Protein: 7.6 g/dL (ref 6.0–8.5)
eGFR: 104 mL/min/{1.73_m2} (ref >59)

## 2021-06-10 ENCOUNTER — Other Ambulatory Visit (INDEPENDENT_AMBULATORY_CARE_PROVIDER_SITE_OTHER): Payer: Self-pay | Admitting: Primary Care

## 2021-06-10 DIAGNOSIS — Z760 Encounter for issue of repeat prescription: Secondary | ICD-10-CM

## 2021-06-10 DIAGNOSIS — E781 Pure hyperglyceridemia: Secondary | ICD-10-CM

## 2021-06-10 DIAGNOSIS — E7841 Elevated Lipoprotein(a): Secondary | ICD-10-CM

## 2021-06-10 MED ORDER — ATORVASTATIN CALCIUM 80 MG PO TABS
ORAL_TABLET | Freq: Every day | ORAL | 1 refills | Status: DC
  Filled 2021-06-10: qty 90, fill #0
  Filled 2021-07-02: qty 30, 30d supply, fill #0
  Filled 2021-08-14: qty 30, 30d supply, fill #1
  Filled 2021-09-13: qty 30, 30d supply, fill #2

## 2021-06-10 MED ORDER — EZETIMIBE 10 MG PO TABS
ORAL_TABLET | Freq: Every day | ORAL | 1 refills | Status: DC
  Filled 2021-06-10: qty 90, fill #0
  Filled 2021-07-02: qty 30, 30d supply, fill #0
  Filled 2021-08-14: qty 30, 30d supply, fill #1
  Filled 2021-09-13: qty 30, 30d supply, fill #2
  Filled 2021-10-17: qty 30, 30d supply, fill #0
  Filled 2021-11-29: qty 30, 30d supply, fill #1

## 2021-06-11 ENCOUNTER — Other Ambulatory Visit: Payer: Self-pay

## 2021-06-13 ENCOUNTER — Telehealth (INDEPENDENT_AMBULATORY_CARE_PROVIDER_SITE_OTHER): Payer: Self-pay

## 2021-06-13 NOTE — Telephone Encounter (Signed)
-----   Message from Grayce Sessions, NP sent at 06/10/2021  7:03 PM EDT ----- Your cholesterol is still high, but continued recommendations to make lifestyle changes. Your LDL is above normal. The LDL is the bad cholesterol. Over time and in combination with inflammation and other factors, this contributes to plaque which in turn may lead to stroke and/or heart attack down the road. Sometimes high LDL is primarily genetic, and people might be eating all the right foods but still have high numbers. Other times, there is room for improvement in one's diet and eating healthier can bring this number down and potentially reduce one's risk of heart attack and/or stroke.   To reduce your LDL, Remember - more fruits and vegetables, more fish, and limit red meat and dairy products. More soy, nuts, beans, barley, lentils, oats and plant sterol ester enriched margarine instead of butter. I also encourage eliminating sugar and processed food.  You are on 3 medication atorvastatin 80mg , daily zetia 10mg  daily and fenofibrate 160mg  daily

## 2021-06-13 NOTE — Telephone Encounter (Signed)
Contacted patient with assistance of pacific interpreter [phone]) patient aware that cholesterol remains elevated and she should be taking all 3 of her cholesterol medications daily. States that she is taking the medications daily as prescribed. Maryjean Morn, CMA

## 2021-06-29 ENCOUNTER — Encounter (INDEPENDENT_AMBULATORY_CARE_PROVIDER_SITE_OTHER): Payer: Self-pay | Admitting: Primary Care

## 2021-06-29 ENCOUNTER — Other Ambulatory Visit: Payer: Self-pay

## 2021-06-29 ENCOUNTER — Ambulatory Visit (INDEPENDENT_AMBULATORY_CARE_PROVIDER_SITE_OTHER): Payer: Self-pay | Admitting: Primary Care

## 2021-06-29 VITALS — BP 127/74 | HR 75 | Temp 97.3°F | Ht <= 58 in | Wt 182.0 lb

## 2021-06-29 DIAGNOSIS — Z794 Long term (current) use of insulin: Secondary | ICD-10-CM

## 2021-06-29 DIAGNOSIS — E119 Type 2 diabetes mellitus without complications: Secondary | ICD-10-CM

## 2021-06-29 DIAGNOSIS — R0981 Nasal congestion: Secondary | ICD-10-CM

## 2021-06-29 LAB — GLUCOSE, POCT (MANUAL RESULT ENTRY): POC Glucose: 178 mg/dl — AB (ref 70–99)

## 2021-06-29 LAB — POCT GLYCOSYLATED HEMOGLOBIN (HGB A1C): Hemoglobin A1C: 8 % — AB (ref 4.0–5.6)

## 2021-06-29 MED ORDER — LORATADINE 10 MG PO TABS
10.0000 mg | ORAL_TABLET | Freq: Every day | ORAL | 3 refills | Status: DC
  Filled 2021-06-29: qty 90, 90d supply, fill #0

## 2021-06-29 MED ORDER — FLUTICASONE PROPIONATE 50 MCG/ACT NA SUSP
2.0000 | Freq: Every day | NASAL | 6 refills | Status: DC
  Filled 2021-06-29: qty 16, 30d supply, fill #0

## 2021-06-29 NOTE — Progress Notes (Signed)
Not fasting

## 2021-06-29 NOTE — Progress Notes (Signed)
Subjective:  Patient ID: Denise Mosley, female    DOB: 15-Mar-1961  Age: 60 y.o. MRN: [id number]  CC: Diabetes   HPI [name] is a 60 year old Hispanic female (interpreter Jana Half [phone]) presents for follow-up of diabetes. Patient does not check blood sugar at home  Compliant with meds - Yes Checking CBGs? Yes  Fasting avg - 130-180  Postprandial average -  Exercising regularly? - Yes Watching carbohydrate intake? - Yes Neuropathy ? - No Hypoglycemic events - No  - Recovers with :   Pertinent ROS:  Polyuria - No Polydipsia - No Vision problems - No  Medications as noted below. Taking them regularly without complication/adverse reaction being reported today.   History Denise Mosley has a past medical history of Bell's palsy, Diabetes mellitus without complication ([place]), Hyperlipidemia, and Hypertension.   She has a past surgical history that includes Cesarean section and Cholecystectomy.   Her family history includes Diabetes in her father and mother.She reports that she has quit smoking. She has never used smokeless tobacco. She reports that she does not drink alcohol and does not use drugs.  Current Outpatient Medications on File Prior to Visit  Medication Sig Dispense Refill  . albuterol (VENTOLIN HFA) 108 (90 Base) MCG/ACT inhaler INHALE 2 PUFFS INTO THE LUNGS EVERY 6 (SIX) HOURS AS NEEDED FOR WHEEZING OR SHORTNESS OF BREATH. 18 g 1  . atorvastatin (LIPITOR) 80 MG tablet TAKE 1 TABLET (80 MG TOTAL) BY MOUTH DAILY. 90 tablet 1  . blood glucose meter kit and supplies Dispense based on patient and insurance preference. Use up to four times daily as directed. (FOR ICD-10 E10.9, E11.9). 1 each 0  . Blood Glucose Monitoring Suppl (TRUE METRIX AIR GLUCOSE METER) w/Device KIT 1 Bag by Does not apply route 3 (three) times daily. 1 kit 0  . Blood Glucose Monitoring Suppl (TRUE METRIX METER) w/Device KIT Use as instructed 1 kit 0  . Dulaglutide (TRULICITY) 1.5 WE/3.1VQ SOPN  Inject 1.5 mg into the skin once a week. 2 mL 2  . ezetimibe (ZETIA) 10 MG tablet TAKE 1 TABLET (10 MG TOTAL) BY MOUTH DAILY. 90 tablet 1  . fenofibrate 160 MG tablet Take 1 tablet (160 mg total) by mouth daily. 90 tablet 1  . glucose blood test strip USE AS INSTRUCTED 100 strip 12  . Insulin Pen Needle (B-D UF III MINI PEN NEEDLES) 31G X 5 MM MISC Use as instructed 100 each 5  . lisinopril (ZESTRIL) 2.5 MG tablet TAKE 1 TABLET (2.5 MG TOTAL) BY MOUTH DAILY. 90 tablet 1  . metFORMIN (GLUCOPHAGE) 1000 MG tablet Take 1 tablet (1,000 mg total) by mouth 2 (two) times daily with a meal. 180 tablet 3  . TRUEplus Lancets 28G MISC Use as instructed to check blood sugar. 100 each 6   No current facility-administered medications on file prior to visit.    ROS Review of Systems  HENT:  Positive for congestion.        Using medication from [place] improve no name  All other systems reviewed and are negative.  Objective:  BP 127/74 (BP Location: Right Arm, Patient Position: Sitting, Cuff Size: Normal)   Pulse 75   Temp (!) 97.3 F (36.3 C) (Temporal)   Ht 4' 8"  (1.422 m)   Wt 182 lb (82.6 kg)   SpO2 95%   BMI 40.80 kg/m   BP Readings from Last 3 Encounters:  06/29/21 127/74  03/29/21 125/72  12/27/20 112/71    Wt Readings  from Last 3 Encounters:  06/29/21 182 lb (82.6 kg)  03/29/21 189 lb 12.8 oz (86.1 kg)  12/27/20 189 lb 12.8 oz (86.1 kg)   Physical exam: General: Vital signs reviewed.  Patient is well-developed and well-nourished, morbid obese female in no acute distress and cooperative with exam. Head: Normocephalic and atraumatic. Eyes: EOMI, conjunctivae normal, no scleral icterus. Neck: Supple, trachea midline, normal ROM, no JVD, masses, thyromegaly, or carotid bruit present. Cardiovascular: RRR, S1 normal, S2 normal, no murmurs, gallops, or rubs. Pulmonary/Chest: Clear to auscultation bilaterally, no wheezes, rales, or rhonchi. Abdominal: Soft, non-tender, non-distended, BS  +, no masses, organomegaly, or guarding present. Musculoskeletal: No joint deformities, erythema, or stiffness, ROM full and nontender. Extremities: No lower extremity edema bilaterally,  pulses symmetric and intact bilaterally. No cyanosis or clubbing. Neurological: A&O x3, Strength is normal Skin: Warm, dry and intact. No rashes or erythema. Psychiatric: Normal mood and affect. speech and behavior is normal. Cognition and memory are normal.    Lab Results  Component Value Date   HGBA1C 11.8 (A) 03/29/2021   HGBA1C 10.9 (A) 12/27/2020   HGBA1C 12.6 (H) 09/28/2020    Lab Results  Component Value Date   WBC 6.2 12/22/2019   HGB 15.3 12/22/2019   HCT 46.2 12/22/2019   PLT 192 12/22/2019   GLUCOSE 188 (H) 06/07/2021   CHOL 211 (H) 06/07/2021   TRIG 753 (HH) 06/07/2021   HDL 29 (L) 06/07/2021   LDLCALC 66 06/07/2021   ALT 47 (H) 06/07/2021   AST 44 (H) 06/07/2021   NA 137 06/07/2021   K 4.3 06/07/2021   CL 100 06/07/2021   CREATININE 0.59 06/07/2021   BUN 14 06/07/2021   CO2 24 06/07/2021   TSH 2.610 08/14/2017   INR 0.98 01/14/2017   HGBA1C 11.8 (A) 03/29/2021     Assessment & Plan:   There are no diagnoses linked to this encounter. I am having Tacoya Altizer maintain her True Metrix Meter, B-D UF III MINI PEN NEEDLES, blood glucose meter kit and supplies, True Metrix Air Glucose Meter, TRUEplus Lancets 28G, glucose blood, albuterol, metFORMIN, Trulicity, fenofibrate, lisinopril, atorvastatin, and ezetimibe.  No orders of the defined types were placed in this encounter.    Follow-up:   No follow-ups on file.  The above assessment and management plan was discussed with the patient. The patient verbalized understanding of and has agreed to the management plan. Patient is aware to call the clinic if symptoms fail to improve or worsen. Patient is aware when to return to the clinic for a follow-up visit. Patient educated on when it is appropriate to go to the emergency  department.   Juluis Mire, NP-C

## 2021-06-29 NOTE — Patient Instructions (Signed)
Colesterol elevado High Cholesterol El colesterol elevado es una afeccin que se caracteriza porque la sangre tiene niveles altos de una sustancia blanca, cerosa y parecida a la grasa (colesterol). El hgado fabrica todo el colesterol que el organismo necesita. El organismo humano necesita pequeas cantidades de colesterol para formar las clulas. El exceso de colesterol se obtiene de los alimentos que se consumen. La sangre transporta el colesterol desde el hgado al resto del cuerpo. Si tiene el colesterol elevado, pueden acumularse depsitos (placas) en las paredes de las arterias. Las arterias son los vasos sanguneos que transportan la sangre desde el corazn al resto del cuerpo. Las placas causan que las arterias se estrechen y se endurezcan. Las placas de colesterol aumentan el riesgo de sufrir un infarto de miocardio y un accidente cerebrovascular. Trabaje con el mdico para mantener las concentraciones de colesterol en un rango saludable. Qu incrementa el riesgo? Los siguientes factores pueden hacer que sea ms propenso a desarrollar esta afeccin: Consumir alimentos con alto contenido de grasa animal (grasa saturada) o colesterol. Tener sobrepeso. No hacer suficiente ejercicio fsico. Tener antecedentes familiares de colesterol alto (hipercolesterolemia familiar). Consumir productos con tabaco. Tener diabetes. Cules son los signos o sntomas? En la mayora de los casos, el colesterol alto no causa ningn sntoma por lo general. En los casos graves, los niveles muy altos de colesterol pueden causar: Protuberancias de grasa debajo de la piel (xantomas). Un anillo blanco o gris alrededor del centro negro (pupila) del ojo. Cmo se diagnostica? Esta afeccin se puede diagnosticar en funcin de los resultados de un anlisis de sangre. Si es mayor de 20 aos, es posible que el mdico le controle el nivel de colesterol cada 4 a 6 aos. Los controles pueden ser ms frecuentes si tiene el  colesterol elevado u otros factores de riesgo de enfermedad cardaca. En el anlisis de sangre de colesterol, se determina lo siguiente: El colesterol "malo", o colesterol LDL. Este es el principal tipo de colesterol que causa enfermedades cardacas. El nivel recomendado es de menos de 100 mg/dl (2.59 mmol/l). El colesterol "bueno", o colesterol HDL. El HDL ayuda a proteger contra la enfermedad cardaca porque limpia las arterias y arrastra el LDL al hgado para que lo procese. El nivel recomendado de HDL es de 60 mg/dl (1.55 mmol/l) o ms. Triglicridos. Estos son grasas que el organismo puede almacenar o quemar como fuente de energa. El nivel recomendado es de menos de 150 mg/dl (1.69 mmol/l). Colesterol total. Mide la cantidad total de colesterol en la sangre, e incluye el colesterol LDL, el colesterol HDL y los triglicridos. El nivel recomendado es de menos de 200 mg/dl (5.17 mmol/l). Cmo se trata? El tratamiento para el colesterol alto comienza con cambios en el estilo de vida, tales como dieta y ejercicio. Cambios en la dieta. Es posible que le indiquen que consuma alimentos con ms fibra y menos grasas saturadas o azcar agregada. Cambios en el estilo de vida. Estos pueden incluir hacer actividad fsica con regularidad, mantener un peso saludable y dejar de consumir productos con tabaco. Medicamentos. Estos se administran cuando los cambios en la dieta y en el estilo de vida no han sido eficaces. Es posible que le receten medicamentos llamados estatinas para bajar sus niveles de colesterol. Siga estas instrucciones en su casa: Comida y bebida  Siga una dieta saludable y equilibrada. Esta dieta incluye lo siguiente: Porciones diarias de frutas y verduras frescas, congeladas o enlatadas. Porciones diarias de alimentos integrales con alto contenido de fibra. Alimentos con   bajo contenido de grasas saturadas y grasas trans. Estos incluyen carne de ave y pescado sin piel, cortes de carne magros  y productos lcteos descremados. Una variedad de pescado, especialmente pescado graso que contenga cidos grasos omega 3. Propngase comer pescado al menos dos veces por semana. Evite los alimentos y las bebidas que tengan azcar agregada. Use mtodos de coccin saludables, como asar, grillar, hervir, hornear, escalfar, cocer al vapor y saltear. No fra los alimentos excepto para saltearlos. Si bebe alcohol: Limite la cantidad que bebe a lo siguiente: De 0 a 1 medida por da para las mujeres que no estn embarazadas. De 0 a 2 medidas por da para los hombres. Sepa cunta cantidad de alcohol hay en las bebidas. En los [place], una medida equivale a una botella de cerveza de 12 oz (355 ml), un vaso de vino de 5 oz (148 ml) o un vaso de una bebida alcohlica de alta graduacin de 1 oz (44 ml). Estilo de vida  Haga ejercicio con regularidad. Trate de hacer un total de 150 minutos de actividad fsica por semana. Aumente la cantidad de ejercicio fsico que realiza mediante actividades como la jardinera, salir a caminar o usar las escaleras. No consuma ningn producto que contenga nicotina o tabaco. Estos productos incluyen cigarrillos, tabaco para mascar y aparatos de vapeo, como los cigarrillos electrnicos. Si necesita ayuda para dejar de consumir estos productos, consulte al mdico. Indicaciones generales Use los medicamentos de venta libre y los recetados solamente como se lo haya indicado el mdico. Concurra a todas las visitas de seguimiento. Esto es importante. Dnde buscar ms informacin American Heart Association (Asociacin Estadounidense del Corazn): www.heart.org National Heart, Lung, and Blood Institute (Instituto Nacional del [place], los Pulmones y la Sangre): www.nhlbi.nih.gov Comunquese con un mdico si: Tiene dificultad para alcanzar o mantener una alimentacin sana y un peso saludable. Est por comenzar un programa de ejercicios. No puede dejar de fumar. Solicite ayuda  de inmediato si: Siente dolor en el pecho. Tiene dificultad para respirar. Tiene molestias o dolor en la mandbula, el cuello, la espalda, los hombros o los brazos. Tiene sntomas de un accidente cerebrovascular. "BE FAST" es una manera fcil de recordar los principales signos de advertencia de un accidente cerebrovascular: B: Balance (equilibrio). Los signos son mareos, dificultad repentina para caminar o prdida del equilibrio. E - Eyes (ojos). Los signos son problemas para ver o un cambio repentino en la visin. F: Face (rostro). Los signos son debilidad repentina o entumecimiento del rostro, o el rostro o el prpado que se caen hacia un lado. A: Arms (brazos). Los signos son debilidad o entumecimiento en un brazo. Esto sucede de repente y generalmente en un lado del cuerpo. S: Speech (habla). Los signos son dificultad para hablar, hablar arrastrando las palabras o dificultad para comprender lo que las personas dicen. T: Time (tiempo). Es tiempo de llamar al servicio de emergencias. Anote la hora a la que comenzaron los sntomas. Presenta otros signos de un accidente cerebrovascular, como los siguientes: Dolor de cabeza sbito e intenso que no tiene causa aparente. Nuseas o vmitos. Convulsiones. Estos sntomas pueden representar un problema grave que constituye una emergencia. No espere a ver si los sntomas desaparecen. Solicite atencin mdica de inmediato. Comunquese con el servicio de emergencias de su localidad (911 en los [place]). No conduzca por sus propios medios hasta el hospital. Resumen Las placas de colesterol aumentan el riesgo de sufrir un infarto de miocardio y un accidente cerebrovascular. Trabaje con el mdico para   mantener las concentraciones de colesterol en un rango saludable. Siga una dieta saludable y equilibrada, haga ejercicio con regularidad y mantenga un peso saludable. No consuma ningn producto que contenga nicotina o tabaco. Estos productos incluyen  cigarrillos, tabaco para mascar y aparatos de vapeo, como los cigarrillos electrnicos. Obtenga ayuda de inmediato si tiene cualquier sntoma de un accidente cerebrovascular. Esta informacin no tiene como fin reemplazar el consejo del mdico. Asegrese de hacerle al mdico cualquier pregunta que tenga. Document Revised: 11/20/2020 Document Reviewed: 11/20/2020 Elsevier Patient Education  2022 [employer].  

## 2021-07-02 ENCOUNTER — Other Ambulatory Visit: Payer: Self-pay

## 2021-07-16 ENCOUNTER — Other Ambulatory Visit: Payer: Self-pay

## 2021-08-02 ENCOUNTER — Other Ambulatory Visit: Payer: Self-pay

## 2021-08-14 ENCOUNTER — Other Ambulatory Visit: Payer: Self-pay

## 2021-09-13 ENCOUNTER — Other Ambulatory Visit: Payer: Self-pay | Admitting: Family Medicine

## 2021-09-13 ENCOUNTER — Other Ambulatory Visit: Payer: Self-pay

## 2021-09-13 DIAGNOSIS — Z794 Long term (current) use of insulin: Secondary | ICD-10-CM

## 2021-09-14 MED ORDER — TRULICITY 1.5 MG/0.5ML ~~LOC~~ SOAJ
1.5000 mg | SUBCUTANEOUS | 2 refills | Status: DC
Start: 2021-09-14 — End: 2021-10-01
  Filled 2021-09-14: qty 2, 28d supply, fill #0

## 2021-09-17 ENCOUNTER — Other Ambulatory Visit: Payer: Self-pay

## 2021-09-18 ENCOUNTER — Other Ambulatory Visit: Payer: Self-pay

## 2021-10-01 ENCOUNTER — Other Ambulatory Visit: Payer: Self-pay

## 2021-10-01 ENCOUNTER — Ambulatory Visit (INDEPENDENT_AMBULATORY_CARE_PROVIDER_SITE_OTHER): Payer: Self-pay | Admitting: Primary Care

## 2021-10-01 ENCOUNTER — Encounter (INDEPENDENT_AMBULATORY_CARE_PROVIDER_SITE_OTHER): Payer: Self-pay | Admitting: Primary Care

## 2021-10-01 VITALS — BP 128/78 | HR 70 | Temp 97.5°F | Ht <= 58 in | Wt 177.8 lb

## 2021-10-01 DIAGNOSIS — Z23 Encounter for immunization: Secondary | ICD-10-CM

## 2021-10-01 DIAGNOSIS — I10 Essential (primary) hypertension: Secondary | ICD-10-CM

## 2021-10-01 DIAGNOSIS — E119 Type 2 diabetes mellitus without complications: Secondary | ICD-10-CM

## 2021-10-01 DIAGNOSIS — Z794 Long term (current) use of insulin: Secondary | ICD-10-CM

## 2021-10-01 LAB — POCT GLYCOSYLATED HEMOGLOBIN (HGB A1C): Hemoglobin A1C: 10.1 % — AB (ref 4.0–5.6)

## 2021-10-01 MED ORDER — LISINOPRIL 2.5 MG PO TABS
ORAL_TABLET | Freq: Every day | ORAL | 1 refills | Status: DC
  Filled 2021-10-01: qty 90, fill #0
  Filled 2021-10-17: qty 30, 30d supply, fill #0
  Filled 2021-11-29: qty 30, 30d supply, fill #1

## 2021-10-01 MED ORDER — METFORMIN HCL 1000 MG PO TABS
1000.0000 mg | ORAL_TABLET | Freq: Two times a day (BID) | ORAL | 3 refills | Status: DC
  Filled 2021-10-01 – 2021-10-17 (×2): qty 180, 90d supply, fill #0

## 2021-10-01 MED ORDER — SEMAGLUTIDE (2 MG/DOSE) 8 MG/3ML ~~LOC~~ SOPN
2.0000 mg | PEN_INJECTOR | SUBCUTANEOUS | 6 refills | Status: DC
Start: 2021-10-01 — End: 2022-04-05
  Filled 2021-10-01: qty 3, 28d supply, fill #0
  Filled 2021-10-29: qty 3, 28d supply, fill #1
  Filled 2022-01-02: qty 12, 112d supply, fill #2

## 2021-10-01 MED ORDER — GLIMEPIRIDE 4 MG PO TABS
4.0000 mg | ORAL_TABLET | Freq: Every day | ORAL | 3 refills | Status: DC
  Filled 2021-10-01: qty 30, 30d supply, fill #0

## 2021-10-01 NOTE — Progress Notes (Signed)
Pt is fasting 

## 2021-10-01 NOTE — Patient Instructions (Addendum)
Gripe en los adultos Influenza, Adult A la gripe tambin se la conoce como influenza. Es una [employer], la nariz y la garganta (vas respiratorias). Se transmite fcilmente de persona a persona (es contagiosa). La gripe causa sntomas que son [employer] Resources de un resfro, junto con fiebre alta y dolores corporales. Cules son las causas? La causa de esta afeccin es el virus de la influenza. Puede contraer el virus de las siguientes maneras: Al inhalar gotitas que quedan en el aire despus de que una persona infectada con gripe tosi o estornud. Al tocar algo que est contaminado con el virus y [employer] mano a la boca, la nariz o los ojos. Qu incrementa el riesgo? Hay ciertas cosas que lo pueden hacer ms propenso a Nurse, adult. Estas incluyen lo siguiente: No lavarse las manos con frecuencia. Tener contacto cercano con [employer] durante la temporada de resfro y gripe. Tocarse la boca, los ojos o la nariz sin antes lavarse las manos. No recibir la SU[employer] INC. Puede correr un mayor riesgo de [place] gripe, y [place] graves, como una infeccin pulmonar (neumona), si usted: Es mayor de 50 aos de edad. Est embarazada. Tiene debilitado el sistema que combate las defensas (sistema inmunitario) debido a una enfermedad o a que toma determinados medicamentos. Tiene una afeccin a largo plazo (crnica), como las siguientes: Enfermedad cardaca, renal o pulmonar. Diabetes. Asma. Tiene un trastorno heptico. Tiene mucho sobrepeso (obesidad [place]). Tiene anemia. Cules son los signos o sntomas? Los sntomas normalmente comienzan de repente y Sonda Primes [address] y [address]. Pueden incluir los siguientes: Cristy Hilts y Danielson. Dolores de [place], dolores en el cuerpo o dolores musculares. Dolor de Investment banker, operational. Tos. Secrecin o congestin nasal. Molestias en el pecho. No querer comer tanto como lo hace normalmente. Sensacin de debilidad o  cansancio. Mareos. Malestar estomacal o vmitos. Cmo se trata? Si la gripe se detecta de forma temprana, puede recibir tratamiento con medicamentos antivirales. Esto puede ayudar a reducir la gravedad y la duracin de la enfermedad. Se los administrarn por boca o a travs de un tubo (catter) intravenoso. Cuidarse en su hogar puede ayudar a que mejoren los sntomas. El mdico puede recomendarle lo siguiente: Tomar medicamentos de Radio broadcast assistant. Beber abundante lquido. La gripe suele desaparecer sola. Si tiene sntomas muy graves u otros problemas, puede recibir tratamiento en un hospital. Siga estas instrucciones en su casa:   Actividad Descanse todo lo que sea necesario. Duerma lo suficiente. Foy [place] en su casa y no concurra al [place] o a la escuela, como se lo haya indicado el mdico. No salga de su casa hasta que no haya tenido fiebre por 24 horas sin tomar medicamentos. Salga de su casa solamente para ir al [employer]. Comida y bebida Wyatt Haste SRO (solucin de rehidratacin oral). Es Ardelia Mems bebida que se vende en farmacias y tiendas. Beba suficiente lquido como para Theatre manager la orina de color amarillo plido. En la medida en que pueda, beba lquidos transparentes en pequeas cantidades. Los lquidos transparentes son, por ejemplo: Grayce Sessions. Trocitos de hielo. Jugo de frutas mezclado con agua. Bebidas deportivas de bajas caloras. Coma alimentos suaves que sean fciles de digerir. En la medida que pueda, consuma pequeas cantidades. Estos alimentos incluyen: Bananas. Pur de [employer]. Arroz. Carnes magras. Tostadas. Galletas. No coma ni beba lo siguiente: Lquidos con alto contenido de azcar o cafena. Alcohol. Alimentos condimentados o con alto contenido de [place]. Indicaciones generales Use los medicamentos de venta libre y los recetados solamente  como se lo haya indicado el mdico. Use un humidificador de aire fro para que el aire de su casa est ms hmedo. Esto puede facilitar  la respiracin. Cuando utilice un humidificador de vapor fro, lmpielo a diario. Vace el agua y [place] por agua limpia. Al toser o estornudar, cbrase la boca y la [place]. Lvese las manos frecuentemente con agua y [place] y durante al menos 20 segundos. Esto tambin es importante despus de toser o Engineering geologist. Si no dispone de [place] y [place], use desinfectante para manos con alcohol. Cumpla con todas las visitas de seguimiento. Cmo se previene?  Colquese la vacuna antigripal todos los [place]. Puede colocarse la vacuna contra la gripe a fines de verano, en otoo o en invierno. Pregntele al mdico cundo debe aplicarse la vacuna contra la gripe. Evite el contacto con personas que estn enfermas durante el otoo y el invierno. Es la temporada del resfro y Emergency planning/management officer. Comunquese con un mdico si: Tiene sntomas nuevos. Tiene los siguientes sntomas: Dolor de Daniel. Materia fecal lquida (diarrea). Grant Ruts. La tos empeora. Empieza a tener ms mucosidad. Tiene Programme researcher, broadcasting/film/video. Vomita. Solicite ayuda de inmediato si: Le falta el aire. Tiene dificultad para respirar. La piel o las uas se ponen de un color azulado. Presenta dolor muy intenso o rigidez en el cuello. Tiene dolor de cabeza repentino. Le duele la cara o el odo de forma repentina. No puede comer ni beber sin vomitar. Estos sntomas pueden representar un problema grave que constituye Radio broadcast assistant. Solicite atencin mdica de inmediato. Comunquese con el servicio de emergencias de su localidad (911 en los [place]). No espere a ver si los sntomas desaparecen. No conduzca por sus propios medios [employer]. Resumen A la gripe tambin se la conoce como influenza. Es una [employer] , la nariz y Administrator. Se transmite fcilmente de [place] persona a otra. Use los medicamentos de venta libre y los recetados solamente como se lo haya indicado el mdico. Aplicarse la vacuna contra la gripe todos los  aos es la mejor manera de no contagiarse la gripe. Esta informacin no tiene Theme park manager el consejo del mdico. Asegrese de hacerle al mdico cualquier pregunta que tenga. Document Revised: 06/29/2020 Document Reviewed: 06/29/2020 Elsevier Patient Education  2022 Elsevier Inc. Las batatas a menudo se promocionan como ms saludables que las papas blancas, pero en realidad, ambos tipos pueden ser muy nutritivos. Mientras que las batatas y las batatas son comparables en su contenido de caloras, protenas y carbohidratos, las papas blancas proporcionan ms potasio, mientras que las batatas son increblemente ricas en vitaminas.Semaglutide Injection Qu es este medicamento? La SEMAGLUTIDA trata la diabetes tipo 2. Acta [employer] niveles de insulina en el cuerpo, lo cual disminuye el azcar en la sangre (glucosa). Tambin reduce la cantidad de azcar liberada en la sangre y desacelera la digestin. Tambin se puede usar para disminuir el riesgo de ataque cardaco y accidente cerebrovascular en personas con diabetes tipo 2. Con frecuencia, este medicamento se combina con cambios en la dieta y el ejercicio. Este medicamento puede ser utilizado para otros usos; si tiene alguna pregunta consulte con su proveedor de atencin mdica o con su farmacutico. MARCAS COMUNES: OZEMPIC Qu le debo informar a mi profesional de la salud antes de tomar este medicamento? Necesitan saber si usted presenta alguno de los siguientes problemas o situaciones: Tumores endocrinos (neoplasia endcrina mltiple tipo II) o si alguien en su familiar tuvo esos tumores Enfermedad ocular, problemas de la visin Antecedentes  de pancreatitis Enfermedad renal Problemas estomacales Cncer de tiroides o si alguien en su familia tuvo cncer de tiroides Runner, broadcasting/film/videoUna reaccin alrgica o inusual a la semaglutida, a otros medicamentos, alimentos, colorantes o conservantes Si est embarazada o buscando quedar embarazada Si est  amamantando a un beb Cmo debo utilizar este medicamento? Este medicamento se debe inyectar debajo de la piel de la parte superior de la pierna (muslo), del rea del estmago o de la parte superior del brazo. Se administra una vez por semana (cada 7 das). Le ensearn cmo preparar y Engineer, materialsadministrar este medicamento. Use el medicamento exactamente como se le indique. Use su medicamento a intervalos regulares. No lo use con una frecuencia mayor a la indicada. Si Bots[place]ausa este medicamento con insulina, debe Fifth Third Bancorpinyectar este medicamento y la insulina por separado. No los mezcle. No se aplique una inyeccin al lado de la otra. Cambie (rote) los sitios de inyeccin con cada inyeccin. Es importante que deseche las agujas y las jeringas usadas en un recipiente resistente a los pinchazos. No las deseche en la basura. Si no tiene un recipiente resistente a los pinchazos, llame a su farmacutico o a su equipo de atencin para obtenerlo. Su farmacutico le dar una Gua del medicamento especial (MedGuide, nombre en ingls) con cada receta y en cada ocasin que la vuelva a surtir. Asegrese de leer esta informacin cada vez cuidadosamente. Este medicamento viene con INSTRUCCIONES DE USO. Pdale a su farmacutico que le indique cmo usar PPL Corporationeste medicamento. Lea la informacin atentamente. Hable con su farmacutico o su equipo de atencin si tiene [place]una pregunta. Hable con su equipo de atencin sobre el uso de este medicamento en nios. Puede requerir atencin especial. Sobredosis: Pngase en contacto inmediatamente con un centro toxicolgico o una sala de urgencia si usted cree que haya tomado demasiado medicamento. ATENCIN: Reynolds AmericanEste medicamento es solo para usted. No comparta este medicamento con nadie. Qu sucede si me olvido de una dosis? Si se olvida una dosis, tmela lo antes posible, si es dentro de los [address]  [address] despus de la fecha en que deba tomarla. Luego tome la prxima dosis en el horario semanal habitual. Si han  pasado ms de 5 das despus de The St. Paul Travelersolvidarse la dosis, no tome la dosis que se olvid. Administre la prxima dosis a la hora habitual. No se administre dosis adicionales o dobles. Si tiene preguntas sobre una dosis que se olvid, contacte a su equipo de atencin para que le brinde asesoramiento. Qu puede interactuar con este medicamento? Otros medicamentos para la diabetes Muchos medicamentos pueden causar Probation officercambios en el nivel de azcar en la sangre. Estos incluyen: Bebidas con alcohol Medicamentos antivirales para el VIH o SIDA Aspirina y otros medicamentos tipo aspirina Ciertos medicamentos para la presin arterial, enfermedad cardiaca y frecuencia cardiaca irregular Cromo Diurticos Hormonas femeninas, tales como estrgenos o progestinas, pldoras anticonceptivas Fenofibrato Gemfibrozil Isoniazida Lanreotida Hormonas masculinas o esteroides anablicos IMAO, tales como [place]ex, Eldepryl, Marplan, Nardil y Parnate Medicamentos para bajar de peso Medicamentos para alergias, asma, resfriados o tos Medicamentos para depresin, ansiedad o trastornos psicticos Niacina Nicotina AINE, medicamentos para Chief Technology Officerel dolor y la inflamacin, tales como ibuprofeno o naproxeno Octreotida Pasireotida Pentamidina Fenitona Probenecid Antibiticos del grupo de las quinolonas, tales como ciprofloxacino, levofloxacino y ofloxacino Algunos suplementos dietticos a base de Insurance claims handlerhierbas Medicamentos esteroideos, tales como la prednisona o la cortisona Sulfametoxazol; trimetoprima Hormonas tiroideas Algunos medicamentos pueden ocultar los sntomas de advertencia de niveles bajos de azcar en la sangre (hipoglucemia). Es posible que deba monitorear ms atentamente su  nivel de azcar en la sangre si est tomando uno de estos medicamentos. Estos medicamentos incluyen: Betabloqueadores, que con frecuencia se usan para la presin arterial alta o problemas cardiacos (algunos ejemplos son atenolol, metoprolol y  propranolol) Clonidina Guanetidina Reserpina Puede ser que esta lista no menciona todas las posibles interacciones. Informe a su profesional de Beazer Homesla salud de In[employer] Micro Inctodos los productos a base de hierbas, medicamentos de Windsorventa libre o suplementos nutritivos que est tomando. Si usted fuma, consume bebidas alcohlicas o si utiliza drogas ilegales, indqueselo tambin a su profesional de Beazer Homesla salud. Algunas sustancias pueden interactuar con su medicamento. A qu debo estar atento al usar PPL Corporationeste medicamento? Visite a su equipo de atencin para que revise su evolucin peridicamente. Beba abundante cantidad de lquidos mientras [place]a este medicamento. Consulte con su equipo de atencin si tiene un ataque de diarrea grave, nuseas y vmitos. La prdida de demasiado lquido corporal puede hacer que sea peligroso usar PPL Corporationeste medicamento. Se monitorizar una prueba llamada Hemoglobina A1C (A1C). Es un anlisis de sangre sencillo. Mide su control del nivel de azcar en la sangre durante los ltimos 2 a 3 meses. Se le realizar esta prueba cada 3 a 6 meses. Aprenda a revisar su nivel de azcar en la sangre. Conozca los sntomas del nivel bajo y alto de International aid/development workerazcar en la sangre, y cmo controlarlos. Siempre lleve con usted una fuente rpida de azcar por si tiene sntomas de nivel bajo de azcar [employer] la sangre. Algunos ejemplos incluyen caramelos duros de azcar o tabletas de glucosa. Asegrese de que otras personas sepan que usted se puede ahogar si come o bebe cuando presenta sntomas graves de Batavianivel bajo de azcar en la sangre, como convulsiones o prdida del conocimiento. Deben obtener ayuda mdica de inmediato. Informe a su equipo de atencin si tiene American Electric Powerniveles altos de Bankerazcar en la sangre. Es posible que deba cambiar la dosis de su medicamento. Si est enfermo o hace ms ejercicio que lo habitual, es posible que necesite cambiar la dosis de su medicamento. No saltee comidas. Pregunte a su equipo de atencin si debe evitar el alcohol.  Muchos productos de venta libre para la tos y el resfriado contienen azcar o alcohol. Estos pueden afectar los niveles de Bankerazcar en la sangre. Los inyectores nunca deben compartirse. Incluso si se cambia la aguja, al compartir se pueden contagiar virus como la hepatitis o el VIH. Use un brazalete o una cadena de identificacin mdica, y lleve con usted una tarjeta que describa su enfermedad, detalles de su medicamento y horario de dosis. No debe quedar embarazada mientras est tomando PPL Corporationeste medicamento. Las mujeres deben informar a su equipo de atencin si estn buscando quedar embarazadas o si creen que podran estar embarazadas. Existe la posibilidad de efectos secundarios graves en un beb sin nacer. Para obtener ms informacin, hable con su equipo de atencin. Qu efectos secundarios puedo tener al Boston Scientificutilizar este medicamento? Efectos secundarios que debe informar a su equipo de atencin tan pronto como sea posible: Reacciones alrgicas: erupcin cutnea, comezn/picazn, urticaria, hinchazn de la cara, los labios, la lengua o la garganta Cambio en la visin Deshidratacin: aumento de la sed, boca seca, sensacin de desmayo o aturdimiento, dolor de Turkmenistancabeza, orina amarilla oscura o marrn Problemas en la vescula biliar: dolor de estmago intenso, nuseas, vmitos, fiebre Palpitaciones cardacas: frecuencia cardiaca rpida, intensa o irregular Lesin en los riones: disminucin en la cantidad de orina, hinchazn de los tobillos, las manos o los pies Pancreatitis: dolor abdominal intenso que se extiende a  la espalda o empeora despus de comer o al tacto, fiebre, nuseas, vmitos Cncer de tiroides: masa o bulto nuevo en el cuello, dolor o problemas para tragar, problemas respiratorios, ronquera Efectos secundarios que generalmente no requieren atencin mdica (debe informarlos a su equipo de atencin si persisten o si son molestos): Diarrea Prdida del apetito Oceanographer Vmito Puede ser que esta lista no menciona todos los posibles efectos secundarios. Comunquese a su mdico por asesoramiento mdico [employer]. Usted puede informar los efectos secundarios a la FDA por telfono al 1-800-FDA-1088. Dnde debo guardar mi medicina? Mantenga fuera del alcance de los nios. Guarde los inyectores sin abrir en el refrigerador a una temperatura de [place] 2 y 8 grados Celsius (entre 36 y 46 grados Fahrenheit). No congele. Proteja de la [place]han y del Airline pilot. Despus de Higher education careers adviser por primera vez, se puede almacenar por [address] a temperatura ambiente entre 15 y 30 grados Celsius (59 y 31 grados Fahrenheit) o Airline pilot. Deseche el inyector usado [address] despus del primer uso o despus de la fecha de vencimiento, lo que suceda primero. No almacene el inyector con la aguja puesta. Si se deja la aguja puesta, es posible que se escape medicamento del Hydrographic surveyor. ATENCIN: Este folleto es un resumen. Puede ser que no cubra toda la posible informacin. Si usted tiene preguntas acerca de esta medicina, consulte con su mdico, su farmacutico o su profesional de Radiographer, therapeutic.  2022 Elsevier/Gold Standard (2021-04-02 00:00:00)

## 2021-10-01 NOTE — Progress Notes (Signed)
Subjective:  Patient ID: Denise Mosley, female    DOB: September 21, 1960  Age: 61 y.o. MRN: [id number]  CC: Diabetes   HPI Denise Mosley 61 year old Hispanic female Barnie Alderman [phone] interpreter )presents for follow-up of diabetes. Patient does check blood sugar at home  Compliant with meds - Yes Checking CBGs? Yes  Fasting avg - 180-300  Postprandial average -  Exercising regularly? - Yes Watching carbohydrate intake? - Yes Neuropathy ? - No Hypoglycemic events - No  - Recovers with :   Pertinent ROS:  Polyuria - No Polydipsia - No Vision problems - No  Medications as noted below. Taking them regularly without complication/adverse reaction being reported today.   History Denise Mosley has a past medical history of Bell's palsy, Diabetes mellitus without complication ([place]), Hyperlipidemia, and Hypertension.   Denise Mosley has a past surgical history that includes Cesarean section and Cholecystectomy.   Denise Mosley family history includes Diabetes in Denise Mosley father and mother.Denise Mosley reports that Denise Mosley has quit smoking. Denise Mosley has never used smokeless tobacco. Denise Mosley reports that Denise Mosley does not drink alcohol and does not use drugs.  Current Outpatient Medications on File Prior to Visit  Medication Sig Dispense Refill   blood glucose meter kit and supplies Dispense based on patient and insurance preference. Use up to four times daily as directed. (FOR ICD-10 E10.9, E11.9). 1 each 0   Blood Glucose Monitoring Suppl (TRUE METRIX AIR GLUCOSE METER) w/Device KIT 1 Bag by Does not apply route 3 (three) times daily. 1 kit 0   Blood Glucose Monitoring Suppl (TRUE METRIX METER) w/Device KIT Use as instructed 1 kit 0   ezetimibe (ZETIA) 10 MG tablet TAKE 1 TABLET (10 MG TOTAL) BY MOUTH DAILY. 90 tablet 1   fenofibrate 160 MG tablet Take 1 tablet (160 mg total) by mouth daily. 90 tablet 1   fluticasone (FLONASE) 50 MCG/ACT nasal spray Place 2 sprays into both nostrils daily. 16 g 6   glucose blood test strip USE AS INSTRUCTED 100  strip 12   Insulin Pen Needle (B-D UF III MINI PEN NEEDLES) 31G X 5 MM MISC Use as instructed 100 each 5   loratadine (CLARITIN) 10 MG tablet Take 1 tablet (10 mg total) by mouth daily. 90 tablet 3   TRUEplus Lancets 28G MISC Use as instructed to check blood sugar. 100 each 6   albuterol (VENTOLIN HFA) 108 (90 Base) MCG/ACT inhaler INHALE 2 PUFFS INTO THE LUNGS EVERY 6 (SIX) HOURS AS NEEDED FOR WHEEZING OR SHORTNESS OF BREATH. 18 g 1   No current facility-administered medications on file prior to visit.    ROS Comprehensive ROS pertinent positive and negative noted in HPI  Objective:  BP 128/78 (BP Location: Right Arm, Patient Position: Sitting, Cuff Size: Normal)    Pulse 70    Temp (!) 97.5 F (36.4 C) (Temporal)    Ht 4' 8"  (1.422 m)    Wt 177 lb 12.8 oz (80.6 kg)    SpO2 94%    BMI 39.86 kg/m   BP Readings from Last 3 Encounters:  10/01/21 128/78  06/29/21 127/74  03/29/21 125/72    Wt Readings from Last 3 Encounters:  10/01/21 177 lb 12.8 oz (80.6 kg)  06/29/21 182 lb (82.6 kg)  03/29/21 189 lb 12.8 oz (86.1 kg)   Physical exam: General: Vital signs reviewed.  Patient is well-developed and well-nourished, morbid obese  in no acute distress and cooperative with exam. Head: Normocephalic and atraumatic. Eyes: EOMI, conjunctivae normal, no scleral icterus.  Neck: Supple, trachea midline, normal ROM, no JVD, masses, thyromegaly, or carotid bruit present. Cardiovascular: RRR, S1 normal, S2 normal, no murmurs, gallops, or rubs. Pulmonary/Chest: Clear to auscultation bilaterally, no wheezes, rales, or rhonchi. Abdominal: Soft, non-tender, non-distended, BS +, no masses, organomegaly, or guarding present. Musculoskeletal: No joint deformities, erythema, or stiffness, ROM full and nontender. Extremities: No lower extremity edema bilaterally,  pulses symmetric and intact bilaterally. No cyanosis or clubbing. Neurological: A&O x3, Strength is normal Skin: Warm, dry and intact. No  rashes or erythema. Psychiatric: Normal mood and affect. speech and behavior is normal. Cognition and memory are normal.    Lab Results  Component Value Date   HGBA1C 10.1 (A) 10/01/2021   HGBA1C 8.0 (A) 06/29/2021   HGBA1C 11.8 (A) 03/29/2021    Lab Results  Component Value Date   WBC 7.6 10/01/2021   HGB 13.7 10/01/2021   HCT 41.8 10/01/2021   PLT 242 10/01/2021   GLUCOSE 174 (H) 10/01/2021   CHOL 107 10/01/2021   TRIG 167 (H) 10/01/2021   HDL 33 (L) 10/01/2021   LDLCALC 46 10/01/2021   ALT 25 10/01/2021   AST 26 10/01/2021   NA 138 10/01/2021   K 4.0 10/01/2021   CL 100 10/01/2021   CREATININE 0.53 (L) 10/01/2021   BUN 15 10/01/2021   CO2 23 10/01/2021   TSH 2.610 08/14/2017   INR 0.98 01/14/2017   HGBA1C 10.1 (A) 10/01/2021     Assessment & Plan:   [name] was seen today for diabetes.  Diagnoses and all orders for this visit:  Need for shingles vaccine -     Varicella-zoster vaccine IM (Shingrix)  Type 2 diabetes mellitus without complication, with long-term current use of insulin (HCC) Discontinue Trulicity 1.5 ml weekly to Ozempic 66m weekly . Monitor foods that are high in carbohydrates are the following rice, potatoes, breads, sugars, and pastas.  Reduction in the intake (eating) will assist in lowering your blood sugars.  -     lisinopril (ZESTRIL) 2.5 MG tablet; TAKE 1 TABLET (2.5 MG TOTAL) BY MOUTH DAILY. -     metFORMIN (GLUCOPHAGE) 1000 MG tablet; Take 1 tablet (1,000 mg total) by mouth 2 (two) times daily with a meal. -     Discontinue: glimepiride (AMARYL) 4 MG tablet; Take 1 tablet (4 mg total) by mouth daily before breakfast. -     CMP14+EGFR -     Lipid Panel -     CBC with Differential -     Semaglutide, 2 MG/DOSE, 8 MG/3ML SOPN; Inject 2 mg as directed once a week.  Essential hypertension Counseled on blood pressure goal of less than 130/80, low-sodium, DASH diet, medication compliance, 150 minutes of moderate intensity exercise per  week. Discussed medication compliance, adverse effects.  -     CMP14+EGFR  Need for immunization against influenza -     Flu Vaccine QUAD 693moM (Fluarix, Fluzone & Alfiuria Quad PF)    I have discontinued Denise Mosley's Trulicity and glimepiride. I am also having Denise Mosley start on Semaglutide (2 MG/DOSE). Additionally, I am having Denise Mosley maintain Denise Mosley True Metrix Meter, B-D UF III MINI PEN NEEDLES, blood glucose meter kit and supplies, True Metrix Air Glucose Meter, TRUEplus Lancets 28G, glucose blood, albuterol, fenofibrate, ezetimibe, fluticasone, loratadine, lisinopril, and metFORMIN.  Meds ordered this encounter  Medications   lisinopril (ZESTRIL) 2.5 MG tablet    Sig: TAKE 1 TABLET (2.5 MG TOTAL) BY MOUTH DAILY.    Dispense:  90 tablet  Refill:  1   metFORMIN (GLUCOPHAGE) 1000 MG tablet    Sig: Take 1 tablet (1,000 mg total) by mouth 2 (two) times daily with a meal.    Dispense:  180 tablet    Refill:  3   DISCONTD: glimepiride (AMARYL) 4 MG tablet    Sig: Take 1 tablet (4 mg total) by mouth daily before breakfast.    Dispense:  30 tablet    Refill:  3   Semaglutide, 2 MG/DOSE, 8 MG/3ML SOPN    Sig: Inject 2 mg as directed once a week.    Dispense:  3 mL    Refill:  6     Follow-up:   Return in about 3 months (around 12/30/2021) for [place] 4 weeks/ DM PCP 3 months.  The above assessment and management plan was discussed with the patient. The patient verbalized understanding of and has agreed to the management plan. Patient is aware to call the clinic if symptoms fail to improve or worsen. Patient is aware when to return to the clinic for a follow-up visit. Patient educated on when it is appropriate to go to the emergency department.   Juluis Mire, NP-C

## 2021-10-02 ENCOUNTER — Other Ambulatory Visit: Payer: Self-pay

## 2021-10-02 LAB — CMP14+EGFR
ALT: 25 IU/L (ref 0–32)
AST: 26 IU/L (ref 0–40)
Albumin/Globulin Ratio: 1.8 (ref 1.2–2.2)
Albumin: 4.8 g/dL (ref 3.8–4.9)
Alkaline Phosphatase: 132 IU/L — ABNORMAL HIGH (ref 44–121)
BUN/Creatinine Ratio: 28 — ABNORMAL HIGH (ref 9–23)
BUN: 15 mg/dL (ref 6–24)
Bilirubin Total: 0.3 mg/dL (ref 0.0–1.2)
CO2: 23 mmol/L (ref 20–29)
Calcium: 9.7 mg/dL (ref 8.7–10.2)
Chloride: 100 mmol/L (ref 96–106)
Creatinine, Ser: 0.53 mg/dL — ABNORMAL LOW (ref 0.57–1.00)
Globulin, Total: 2.7 g/dL (ref 1.5–4.5)
Glucose: 174 mg/dL — ABNORMAL HIGH (ref 70–99)
Potassium: 4 mmol/L (ref 3.5–5.2)
Sodium: 138 mmol/L (ref 134–144)
Total Protein: 7.5 g/dL (ref 6.0–8.5)
eGFR: 107 mL/min/{1.73_m2} (ref >59)

## 2021-10-02 LAB — CBC WITH DIFFERENTIAL/PLATELET
Basophils Absolute: 0 10*3/uL (ref 0.0–0.2)
Basos: 0 %
EOS (ABSOLUTE): 0.6 10*3/uL — ABNORMAL HIGH (ref 0.0–0.4)
Eos: 8 %
Hematocrit: 41.8 % (ref 34.0–46.6)
Hemoglobin: 13.7 g/dL (ref 11.1–15.9)
Immature Grans (Abs): 0 10*3/uL (ref 0.0–0.1)
Immature Granulocytes: 0 %
Lymphocytes Absolute: 3.3 10*3/uL — ABNORMAL HIGH (ref 0.7–3.1)
Lymphs: 43 %
MCH: 28.5 pg (ref 26.6–33.0)
MCHC: 32.8 g/dL (ref 31.5–35.7)
MCV: 87 fL (ref 79–97)
Monocytes Absolute: 0.5 10*3/uL (ref 0.1–0.9)
Monocytes: 7 %
Neutrophils Absolute: 3.2 10*3/uL (ref 1.4–7.0)
Neutrophils: 42 %
Platelets: 242 10*3/uL (ref 150–450)
RBC: 4.81 x10E6/uL (ref 3.77–5.28)
RDW: 11.9 % (ref 11.7–15.4)
WBC: 7.6 10*3/uL (ref 3.4–10.8)

## 2021-10-02 LAB — LIPID PANEL
Chol/HDL Ratio: 3.2 ratio (ref 0.0–4.4)
Cholesterol, Total: 107 mg/dL (ref 100–199)
HDL: 33 mg/dL — ABNORMAL LOW (ref >39)
LDL Chol Calc (NIH): 46 mg/dL (ref 0–99)
Triglycerides: 167 mg/dL — ABNORMAL HIGH (ref 0–149)
VLDL Cholesterol Cal: 28 mg/dL (ref 5–40)

## 2021-10-03 ENCOUNTER — Other Ambulatory Visit (INDEPENDENT_AMBULATORY_CARE_PROVIDER_SITE_OTHER): Payer: Self-pay | Admitting: Primary Care

## 2021-10-03 DIAGNOSIS — Z760 Encounter for issue of repeat prescription: Secondary | ICD-10-CM

## 2021-10-03 DIAGNOSIS — E7841 Elevated Lipoprotein(a): Secondary | ICD-10-CM

## 2021-10-03 DIAGNOSIS — E781 Pure hyperglyceridemia: Secondary | ICD-10-CM

## 2021-10-03 MED ORDER — ATORVASTATIN CALCIUM 80 MG PO TABS
ORAL_TABLET | Freq: Every day | ORAL | 1 refills | Status: DC
  Filled 2021-10-03: qty 90, fill #0
  Filled 2021-10-17: qty 30, 30d supply, fill #0
  Filled 2021-11-29: qty 30, 30d supply, fill #1

## 2021-10-04 ENCOUNTER — Other Ambulatory Visit: Payer: Self-pay

## 2021-10-05 ENCOUNTER — Telehealth (INDEPENDENT_AMBULATORY_CARE_PROVIDER_SITE_OTHER): Payer: Self-pay

## 2021-10-05 NOTE — Telephone Encounter (Signed)
Call placed to patient with the assistance of pacific interpreter [phone]). Patient is aware of results and to continue taking atorvastatin. She verbalized understanding. Maryjean Morn, CMA

## 2021-10-05 NOTE — Telephone Encounter (Signed)
-----   Message from Grayce Sessions, NP sent at 10/03/2021 10:03 PM EST ----- Very please with improvement in cholesterol. Continue to take atorvastatin 80mg  at bedtime. Other labs normal

## 2021-10-17 ENCOUNTER — Other Ambulatory Visit: Payer: Self-pay

## 2021-10-26 NOTE — Progress Notes (Signed)
S:    Denise Mosley is a 61 y.o. female who presents for diabetes evaluation, education, and management. PMH is significant for T2DM, HLD, HTN, hx cholecystectomy. Patient was referred and last seen by Primary Care Provider, Gwinda Passe, NP, on 10/01/21. At last visit, glimepiride was stopped and Trulicity 1.5 mg was switched to Ozempic 2 mg.   Today, the patient arrives in good spirits and the visit was completed with the use of a video Spanish interpreter. Reports BG are much improved and she is feeling better since switching to Ozempic. Reports with her first injection she had weakness and fatigue but this was the same day as her shingles and flu vaccines. She has continued Ozempic since then and weakness/fatigue did not recur.   Family/Social History:  -Former smoker -DM in mother and father  Current diabetes medications include: metformin 1000 mg BID, Ozempic 2 mg weekly Current hypertension medications include: lisinopril 2.5 mg daily Current hyperlipidemia medications include: atorvastatin 80 mg daily, ezetimibe 10 mg daily, fenofibrate 160 mg daily  Patient states that they are taking their medications as prescribed. Patient reports adherence with medications.   Do you feel that your medications are working for you? yes Have you been experiencing any side effects to the medications prescribed? no Do you have any problems obtaining medications due to transportation or finances? no Insurance coverage: self pay  Patient denies hypoglycemic events.  Reported home fasting blood sugars: usually 150; highest 180; lowest 130-140s  Patient denies nocturia (nighttime urination).  Patient denies neuropathy (nerve pain). Patient denies visual changes. Patient reports self foot exams.   Patient reported dietary habits: Reports normal appetite. Eats 2 meals/day (breakfast, late afternoon meal). Stopped eating cereal.   Within the past 12 months, did you worry whether your food  would run out before you got money to buy more? no Within the past 12 months, did the food you bought run out, and you didnt have money to get more? no  Patient-reported exercise habits: has been walking when the weather is warmer; does exercises at home when it's cooler  O:   Lab Results  Component Value Date   HGBA1C 10.1 (A) 10/01/2021   There were no vitals filed for this visit.  Lipid Panel     Component Value Date/Time   CHOL 107 10/01/2021 0958   TRIG 167 (H) 10/01/2021 0958   HDL 33 (L) 10/01/2021 0958   CHOLHDL 3.2 10/01/2021 0958   LDLCALC 46 10/01/2021 0958    Clinical Atherosclerotic Cardiovascular Disease (ASCVD): No  The ASCVD Risk score (Arnett DK, et al., 2019) failed to calculate for the following reasons:   The valid total cholesterol range is 130 to 320 mg/dL    A/P: Diabetes longstanding currently uncontrolled with most recent A1c 10.1 but with BG improving since last visit. Patient is able to verbalize appropriate hypoglycemia management plan. Medication adherence appears appropriate. Will add SGLT2i today.  -Start Jardiance 10 mg daily -Continue metformin 1000 mg BID and Ozempic 2 mg weekly -Patient educated on purpose, proper use, potential adverse effects, and sick day rules for Jardiance.  -Extensively discussed pathophysiology of diabetes, recommended lifestyle interventions, dietary effects on blood sugar control.  -Counseled on s/sx of and management of hypoglycemia.  -Next A1c anticipated April 2023.   Written patient instructions provided. Patient verbalized understanding of treatment plan. Total time in face to face counseling 30 minutes.    Follow up pharmacist clinic visit in 4 weeks. Next PCP visit in April 2023.  Pervis Hocking, PharmD PGY2 Ambulatory Care Pharmacy Resident 10/29/2021 10:38 AM

## 2021-10-29 ENCOUNTER — Other Ambulatory Visit: Payer: Self-pay

## 2021-10-29 ENCOUNTER — Ambulatory Visit: Payer: Self-pay | Attending: Physician Assistant | Admitting: Pharmacist

## 2021-10-29 DIAGNOSIS — E119 Type 2 diabetes mellitus without complications: Secondary | ICD-10-CM

## 2021-10-29 DIAGNOSIS — Z794 Long term (current) use of insulin: Secondary | ICD-10-CM

## 2021-10-29 MED ORDER — EMPAGLIFLOZIN 10 MG PO TABS
10.0000 mg | ORAL_TABLET | Freq: Every day | ORAL | 2 refills | Status: DC
  Filled 2021-10-29: qty 30, 30d supply, fill #0
  Filled 2021-11-29: qty 30, 30d supply, fill #1

## 2021-10-30 ENCOUNTER — Other Ambulatory Visit: Payer: Self-pay

## 2021-11-02 ENCOUNTER — Other Ambulatory Visit: Payer: Self-pay

## 2021-11-25 NOTE — Progress Notes (Unsigned)
? ?  S:    ?Denise Mosley is a 61 y.o. female who presents for diabetes evaluation, education, and management. PMH is significant for T2DM, HLD, HTN, hx cholecystectomy. Patient was referred and last seen by Primary Care Provider, Gwinda Passe, NP, on 10/01/21. At last visit, glimepiride was stopped and Trulicity 1.5 mg was switched to Ozempic 2 mg. Last seen by pharmacist 10/29/21, started Jardiance.  ? ?Today, the patient arrives in good spirits and the visit was completed with the use of a video Spanish interpreter. ***  ?-tolerating jardiance? Next pcp visit 4/17  ? ?Family/Social History:  ?-Former smoker ?-DM in mother and father ? ?Current diabetes medications include: metformin 1000 mg BID, Ozempic 2 mg weekly, Jardiance 10 mg daily ?Current hypertension medications include: lisinopril 2.5 mg daily ?Current hyperlipidemia medications include: atorvastatin 80 mg daily, ezetimibe 10 mg daily, fenofibrate 160 mg daily ? ?Patient states that they are taking their medications as prescribed. Patient reports adherence with medications.  ? ?Do you feel that your medications are working for you? yes ?Have you been experiencing any side effects to the medications prescribed? no ?Do you have any problems obtaining medications due to transportation or finances? no ?Insurance coverage: self pay ? ?Patient denies hypoglycemic events. ? ?Reported home fasting blood sugars: usually 150; highest 180; lowest 130-140s ? ?Patient denies nocturia (nighttime urination).  ?Patient denies neuropathy (nerve pain). ?Patient denies visual changes. ?Patient reports self foot exams.  ? ?Patient reported dietary habits: Reports normal appetite. Eats 2 meals/day (breakfast, late afternoon meal). Stopped eating cereal.  ? ?Within the past 12 months, did you worry whether your food would run out before you got money to buy more? no ?Within the past 12 months, did the food you bought run out, and you didn?t have money to get more?  no ? ?Patient-reported exercise habits: has been walking when the weather is warmer; does exercises at home when it's cooler ? ?O:  ? ?Lab Results  ?Component Value Date  ? HGBA1C 10.1 (A) 10/01/2021  ? ?There were no vitals filed for this visit. ? ?Lipid Panel  ?   ?Component Value Date/Time  ? CHOL 107 10/01/2021 0958  ? TRIG 167 (H) 10/01/2021 0958  ? HDL 33 (L) 10/01/2021 0958  ? CHOLHDL 3.2 10/01/2021 0958  ? LDLCALC 46 10/01/2021 0958  ? ? ?Clinical Atherosclerotic Cardiovascular Disease (ASCVD): No  ?The ASCVD Risk score (Arnett DK, et al., 2019) failed to calculate for the following reasons: ?  The valid total cholesterol range is 130 to 320 mg/dL  ? ? ?A/P: ?Diabetes longstanding currently uncontrolled with most recent A1c 10.1 but with BG improving since last visit. Patient is able to verbalize appropriate hypoglycemia management plan. Medication adherence appears appropriate. Will add SGLT2i today.  ?-Start Jardiance 10 mg daily ?-Continue metformin 1000 mg BID and Ozempic 2 mg weekly ?-Patient educated on purpose, proper use, potential adverse effects, and sick day rules for Jardiance.  ?-Extensively discussed pathophysiology of diabetes, recommended lifestyle interventions, dietary effects on blood sugar control.  ?-Counseled on s/sx of and management of hypoglycemia.  ?-Next A1c anticipated April 2023.  ? ?Written patient instructions provided. Patient verbalized understanding of treatment plan. Total time in face to face counseling 30 minutes.   ? ?Follow up pharmacist clinic visit in 4 weeks. Next PCP visit in April 2023.  ? ?Pervis Hocking, PharmD ?PGY2 Ambulatory Care Pharmacy Resident ?11/25/2021 5:30 PM ? ?

## 2021-11-26 ENCOUNTER — Ambulatory Visit: Payer: No Typology Code available for payment source | Admitting: Pharmacist

## 2021-11-29 ENCOUNTER — Other Ambulatory Visit: Payer: Self-pay

## 2021-12-18 ENCOUNTER — Ambulatory Visit: Payer: Self-pay | Attending: Family Medicine | Admitting: Pharmacist

## 2021-12-18 ENCOUNTER — Other Ambulatory Visit: Payer: Self-pay

## 2021-12-18 DIAGNOSIS — E119 Type 2 diabetes mellitus without complications: Secondary | ICD-10-CM

## 2021-12-18 DIAGNOSIS — Z794 Long term (current) use of insulin: Secondary | ICD-10-CM

## 2021-12-18 LAB — POCT GLYCOSYLATED HEMOGLOBIN (HGB A1C): HbA1c, POC (controlled diabetic range): 7.3 % — AB (ref 0.0–7.0)

## 2021-12-18 MED ORDER — TRULICITY 1.5 MG/0.5ML ~~LOC~~ SOAJ
1.5000 mg | SUBCUTANEOUS | 2 refills | Status: DC
  Filled 2021-12-18: qty 2, 28d supply, fill #0

## 2021-12-18 MED ORDER — EMPAGLIFLOZIN 10 MG PO TABS
10.0000 mg | ORAL_TABLET | Freq: Every day | ORAL | 2 refills | Status: DC
  Filled 2021-12-18: qty 30, 30d supply, fill #0

## 2021-12-18 NOTE — Progress Notes (Signed)
? ?  S:    ?Denise Mosley is a 61 y.o. female who presents for diabetes evaluation, education, and management. PMH is significant for T2DM, HLD, HTN, hx cholecystectomy. Patient was referred and last seen by Primary Care Provider, Gwinda Passe, NP, on 10/01/21. At last visit, glimepiride was stopped and Trulicity 1.5 mg was switched to Ozempic 2 mg. We saw her on 10/29/2021 and added Jardiance to her regimen. ? ?Today, the patient arrives in good spirits. Reports BG are much improved and she is feeling better since switching to Ozempic. Endorses compliance with metformin and Jardiance as well. Denies any NV, abdominal pain. No changes in vision.  ? ?Family/Social History:  ?-Former smoker ?-DM in mother and father ? ?Current diabetes medications include: metformin 1000 mg BID, Ozempic 2 mg weekly, Jardiance 10 mg daily ?Current hypertension medications include: lisinopril 2.5 mg daily ?Current hyperlipidemia medications include: atorvastatin 80 mg daily, ezetimibe 10 mg daily, fenofibrate 160 mg daily ? ?Patient states that they are taking their medications as prescribed. Patient reports adherence with medications.  ? ?Insurance coverage: self pay ? ?Patient denies hypoglycemic events. ? ?Reported home fasting blood sugars: usually 120s-130s ? ?Patient denies nocturia (nighttime urination).  ?Patient denies neuropathy (nerve pain). ?Patient denies visual changes. ?Patient reports self foot exams.  ? ?Patient reported dietary habits: Reports normal appetite. Eats 2 meals/day (breakfast, late afternoon meal). No changes in diet since last visit. ? ?Patient-reported exercise habits: has been walking when the weather is warmer; does exercises at home when it's cooler ? ?O:  ? ?Lab Results  ?Component Value Date  ? HGBA1C 7.3 (A) 12/18/2021  ? ?There were no vitals filed for this visit. ? ?Lipid Panel  ?   ?Component Value Date/Time  ? CHOL 107 10/01/2021 0958  ? TRIG 167 (H) 10/01/2021 0958  ? HDL 33 (L) 10/01/2021  0958  ? CHOLHDL 3.2 10/01/2021 0958  ? LDLCALC 46 10/01/2021 0958  ? ? ?Clinical Atherosclerotic Cardiovascular Disease (ASCVD): No  ?The ASCVD Risk score (Arnett DK, et al., 2019) failed to calculate for the following reasons: ?  The valid total cholesterol range is 130 to 320 mg/dL  ? ? ?A/P: ?Diabetes longstanding currently close to goal with A1c of 7.3%. Commended patient on this!! Patient is able to verbalize appropriate hypoglycemia management plan. Medication adherence appears appropriate. Adherence encouraged - pt will follow-up with her PCP. ?-Continue current regimen. ?-Patient educated on purpose, proper use, potential adverse effects, and sick day rules for Jardiance.  ?-Extensively discussed pathophysiology of diabetes, recommended lifestyle interventions, dietary effects on blood sugar control.  ?-Counseled on s/sx of and management of hypoglycemia.  ?-Next A1c anticipated July 2023.  ? ?Written patient instructions provided. Patient verbalized understanding of treatment plan. Total time in face to face counseling 30 minutes.   ? ?Follow up PCP clinic visit 12/31/21. ? ? ?Butch Penny, PharmD, BCACP, CPP ?Clinical Pharmacist ?[facility] ?[phone] ? ? ?

## 2021-12-19 ENCOUNTER — Other Ambulatory Visit: Payer: Self-pay

## 2021-12-20 ENCOUNTER — Other Ambulatory Visit: Payer: Self-pay

## 2021-12-25 ENCOUNTER — Other Ambulatory Visit: Payer: Self-pay

## 2021-12-31 ENCOUNTER — Ambulatory Visit (INDEPENDENT_AMBULATORY_CARE_PROVIDER_SITE_OTHER): Payer: Self-pay | Admitting: Primary Care

## 2021-12-31 ENCOUNTER — Encounter (INDEPENDENT_AMBULATORY_CARE_PROVIDER_SITE_OTHER): Payer: Self-pay | Admitting: Primary Care

## 2021-12-31 ENCOUNTER — Other Ambulatory Visit: Payer: Self-pay

## 2021-12-31 VITALS — BP 113/71 | HR 65 | Temp 98.1°F | Ht <= 58 in | Wt 172.2 lb

## 2021-12-31 DIAGNOSIS — E781 Pure hyperglyceridemia: Secondary | ICD-10-CM

## 2021-12-31 DIAGNOSIS — Z794 Long term (current) use of insulin: Secondary | ICD-10-CM

## 2021-12-31 DIAGNOSIS — E7841 Elevated Lipoprotein(a): Secondary | ICD-10-CM

## 2021-12-31 DIAGNOSIS — Z760 Encounter for issue of repeat prescription: Secondary | ICD-10-CM

## 2021-12-31 DIAGNOSIS — E119 Type 2 diabetes mellitus without complications: Secondary | ICD-10-CM

## 2021-12-31 MED ORDER — LISINOPRIL 2.5 MG PO TABS
ORAL_TABLET | Freq: Every day | ORAL | 1 refills | Status: DC
  Filled 2021-12-31: qty 30, 30d supply, fill #0
  Filled 2022-02-25: qty 30, 30d supply, fill #1

## 2021-12-31 MED ORDER — METFORMIN HCL 1000 MG PO TABS
1000.0000 mg | ORAL_TABLET | Freq: Two times a day (BID) | ORAL | 3 refills | Status: DC
  Filled 2021-12-31: qty 60, 30d supply, fill #0
  Filled 2022-02-25: qty 60, 30d supply, fill #1
  Filled 2022-06-21: qty 60, 30d supply, fill #2

## 2021-12-31 MED ORDER — EZETIMIBE 10 MG PO TABS
ORAL_TABLET | Freq: Every day | ORAL | 1 refills | Status: DC
  Filled 2021-12-31: qty 30, 30d supply, fill #0
  Filled 2022-02-25: qty 30, 30d supply, fill #1

## 2021-12-31 MED ORDER — FENOFIBRATE 160 MG PO TABS
160.0000 mg | ORAL_TABLET | Freq: Every day | ORAL | 1 refills | Status: DC
  Filled 2021-12-31: qty 30, 30d supply, fill #0
  Filled 2022-02-25: qty 30, 30d supply, fill #1

## 2021-12-31 MED ORDER — ATORVASTATIN CALCIUM 80 MG PO TABS
ORAL_TABLET | Freq: Every day | ORAL | 1 refills | Status: DC
  Filled 2021-12-31: qty 30, 30d supply, fill #0
  Filled 2022-02-25: qty 30, 30d supply, fill #1

## 2021-12-31 NOTE — Progress Notes (Signed)
?[place] ? ?Denise Mosley, is a 61 y.o. female ? ?[id number] ? ?[id number] ? ?DOB - 14-Feb-1961 ? ?Chief Complaint  ?Patient presents with  ? Diabetes  ?    ? ?Subjective:  ? 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 She denies polyuria, polydipsia or polyphagia . She is very please to report she is walking cut out all her sweets.  Patient has No headache, No chest pain, No abdominal pain - No Nausea, No new weakness tingling or numbness, No Cough - shortness of breath ? ?No problems updated. ? ?No Known Allergies ? ?Past Medical History:  ?Diagnosis Date  ? Bell's palsy   ? Diabetes mellitus without complication ([place])   ? Hyperlipidemia   ? Hypertension   ? ? ?Current Outpatient Medications on File Prior to Visit  ?Medication Sig Dispense Refill  ? atorvastatin (LIPITOR) 80 MG tablet TAKE 1 TABLET (80 MG TOTAL) BY MOUTH DAILY. 90 tablet 1  ? Blood Glucose Monitoring Suppl (TRUE METRIX AIR GLUCOSE METER) w/Device KIT 1 Bag by Does not apply route 3 (three) times daily. 1 kit 0  ? Blood Glucose Monitoring Suppl (TRUE METRIX METER) w/Device KIT Use as instructed 1 kit 0  ? Dulaglutide (TRULICITY) 1.5 ZE/0.9QZ SOPN Inject 1.5 mg into the skin once a week. 2 mL 2  ? empagliflozin (JARDIANCE) 10 MG TABS tablet Take 1 tablet (10 mg total) by mouth daily before breakfast. 30 tablet 2  ? ezetimibe (ZETIA) 10 MG tablet TAKE 1 TABLET (10 MG TOTAL) BY MOUTH DAILY. 90 tablet 1  ? fenofibrate 160 MG tablet Take 1 tablet (160 mg total) by mouth daily. 90 tablet 1  ? fluticasone (FLONASE) 50 MCG/ACT nasal spray Place 2 sprays into both nostrils daily. 16 g 6  ? Insulin Pen Needle (B-D UF III MINI PEN NEEDLES) 31G X 5 MM MISC Use as instructed 100 each 5  ? lisinopril (ZESTRIL) 2.5 MG tablet TAKE 1 TABLET (2.5 MG TOTAL) BY MOUTH DAILY. 90 tablet 1  ? loratadine (CLARITIN) 10 MG tablet Take 1 tablet (10 mg total) by mouth  daily. 90 tablet 3  ? metFORMIN (GLUCOPHAGE) 1000 MG tablet Take 1 tablet (1,000 mg total) by mouth 2 (two) times daily with a meal. 180 tablet 3  ? Semaglutide, 2 MG/DOSE, 8 MG/3ML SOPN Inject 2 mg as directed once a week. 3 mL 6  ? TRUEplus Lancets 28G MISC Use as instructed to check blood sugar. 100 each 6  ? albuterol (VENTOLIN HFA) 108 (90 Base) MCG/ACT inhaler INHALE 2 PUFFS INTO THE LUNGS EVERY 6 (SIX) HOURS AS NEEDED FOR WHEEZING OR SHORTNESS OF BREATH. 18 g 1  ? ?No current facility-administered medications on file prior to visit.  ? ?Comprehensive ROS Pertinent positive and negative noted in HPI   ?Objective:  ? ?Vitals:  ? 12/31/21 0926  ?BP: 113/71  ?Pulse: 65  ?Temp: 98.1 ?F (36.7 ?C)  ?TempSrc: Oral  ?SpO2: 95%  ?Weight: 172 lb 3.2 oz (78.1 kg)  ?Height: 4' 8"  (1.422 m)  ? ? ?Exam ?General appearance : Awake, alert, not in any distress. Speech Clear. Not toxic looking ?HEENT: Atraumatic and Normocephalic, pupils equally reactive to light and accomodation ?Neck: Supple, no JVD. No cervical lymphadenopathy.  ?Chest: Good air entry bilaterally, no added sounds  ?CVS: S1 S2 regular, no murmurs.  ?Abdomen: Bowel sounds present, Non tender and not distended with no gaurding, rigidity or  rebound. ?Extremities: B/L Lower Ext shows no edema, both legs are warm to touch ?Neurology: Awake alert, and oriented X 3,  Non focal ?Skin: No Rash ? ?Data Review ?Lab Results  ?Component Value Date  ? HGBA1C 7.3 (A) 12/18/2021  ? HGBA1C 10.1 (A) 10/01/2021  ? HGBA1C 8.0 (A) 06/29/2021  ? ? ?Assessment & Plan  ?Denise Mosley was seen today for diabetes. ? ?Diagnoses and all orders for this visit: ? ?Hypertriglyceridemia ?On dual therapy statin and fenofibrate .  Healthy lifestyle diet of fruits vegetables fish nuts whole grains and low saturated fat . Foods high in cholesterol or liver, fatty meats,cheese, butter avocados, nuts and seeds, chocolate and fried foods. ?- Lipid Panel; Future ? ?Type 2 diabetes mellitus without  complication, with long-term current use of insulin ([place]) ?Improvement from 10.1 3 months ago 7.3 Continue to monitor foods that are high in carbohydrates are the following rice, potatoes, tortillas, breads, sugars, and pastas.  Reduction in the intake (eating) will assist in lowering your blood sugars. Followed by clinical pharmacist  ? ?Medication refill ?-     atorvastatin (LIPITOR) 80 MG tablet; TAKE 1 TABLET (80 MG TOTAL) BY MOUTH DAILY. ?-     ezetimibe (ZETIA) 10 MG tablet; TAKE 1 TABLET (10 MG TOTAL) BY MOUTH DAILY. ?-     fenofibrate 160 MG tablet; Take 1 tablet (160 mg total) by mouth daily. ?-     lisinopril (ZESTRIL) 2.5 MG tablet; TAKE 1 TABLET (2.5 MG TOTAL) BY MOUTH DAILY. ?-     metFORMIN (GLUCOPHAGE) 1000 MG tablet; Take 1 tablet (1,000 mg total) by mouth 2 (two) times daily with a meal. ? ?   ? ?Patient have been counseled extensively about nutrition and exercise. Other issues discussed during this visit include: low cholesterol diet, weight control and daily exercise, foot care, annual eye examinations at Ophthalmology, importance of adherence with medications and regular follow-up. We also discussed long term complications of uncontrolled diabetes and hypertension.  ? ?Return in about 3 months (around 04/01/2022) for DM fasting . ? ?The patient was given clear instructions to go to ER or return to medical center if symptoms don't improve, worsen or new problems develop. The patient verbalized understanding. The patient was told to call to get lab results if they haven't heard anything in the next week.  ? ?This note has been created with Surveyor, quantity. Any transcriptional errors are unintentional.  ? ?[name], NP ?12/31/2021, 10:03 AM ? ?

## 2022-01-02 ENCOUNTER — Other Ambulatory Visit: Payer: Self-pay

## 2022-01-08 ENCOUNTER — Other Ambulatory Visit: Payer: Self-pay

## 2022-02-21 ENCOUNTER — Other Ambulatory Visit: Payer: Self-pay

## 2022-02-25 ENCOUNTER — Other Ambulatory Visit: Payer: Self-pay

## 2022-03-20 ENCOUNTER — Other Ambulatory Visit: Payer: Self-pay

## 2022-04-05 ENCOUNTER — Other Ambulatory Visit: Payer: Self-pay

## 2022-04-05 ENCOUNTER — Encounter (INDEPENDENT_AMBULATORY_CARE_PROVIDER_SITE_OTHER): Payer: Self-pay | Admitting: Primary Care

## 2022-04-05 ENCOUNTER — Ambulatory Visit (INDEPENDENT_AMBULATORY_CARE_PROVIDER_SITE_OTHER): Payer: Self-pay | Admitting: Primary Care

## 2022-04-05 VITALS — BP 115/72 | HR 60 | Temp 98.1°F | Ht <= 58 in | Wt 174.2 lb

## 2022-04-05 DIAGNOSIS — E781 Pure hyperglyceridemia: Secondary | ICD-10-CM

## 2022-04-05 DIAGNOSIS — E7841 Elevated Lipoprotein(a): Secondary | ICD-10-CM

## 2022-04-05 DIAGNOSIS — Z794 Long term (current) use of insulin: Secondary | ICD-10-CM

## 2022-04-05 DIAGNOSIS — Z760 Encounter for issue of repeat prescription: Secondary | ICD-10-CM

## 2022-04-05 DIAGNOSIS — I10 Essential (primary) hypertension: Secondary | ICD-10-CM

## 2022-04-05 DIAGNOSIS — E119 Type 2 diabetes mellitus without complications: Secondary | ICD-10-CM

## 2022-04-05 LAB — POCT GLYCOSYLATED HEMOGLOBIN (HGB A1C): Hemoglobin A1C: 9.5 % — AB (ref 4.0–5.6)

## 2022-04-05 MED ORDER — TRULICITY 1.5 MG/0.5ML ~~LOC~~ SOAJ
1.5000 mg | SUBCUTANEOUS | 2 refills | Status: DC
  Filled 2022-04-05: qty 2, 28d supply, fill #0
  Filled 2022-05-08: qty 2, 28d supply, fill #1
  Filled 2022-06-21: qty 2, 28d supply, fill #2

## 2022-04-05 MED ORDER — ATORVASTATIN CALCIUM 80 MG PO TABS
ORAL_TABLET | Freq: Every day | ORAL | 1 refills | Status: DC
  Filled 2022-04-05: qty 90, 90d supply, fill #0

## 2022-04-05 MED ORDER — ALBUTEROL SULFATE HFA 108 (90 BASE) MCG/ACT IN AERS
1.0000 | INHALATION_SPRAY | Freq: Four times a day (QID) | RESPIRATORY_TRACT | 1 refills | Status: DC | PRN
  Filled 2022-04-05: qty 18, 25d supply, fill #0

## 2022-04-05 MED ORDER — EMPAGLIFLOZIN 10 MG PO TABS
10.0000 mg | ORAL_TABLET | Freq: Every day | ORAL | 1 refills | Status: DC
  Filled 2022-04-05: qty 90, 90d supply, fill #0

## 2022-04-05 NOTE — Progress Notes (Signed)
Subjective:  Patient ID: Denise Mosley, female    DOB: [birth year]  Age: 61 y.o. MRN: [id number]  CC: Diabetes Denise Mosley [id number]  HPI [name] presents forFollow-up of diabetes. Patient does not check blood sugar at home. Patient has not been Ozempic because it made her sick and PCP not aware  Compliant with meds - as much as possible /language barrier  Checking CBGs? Yes  Fasting avg - 170-300  Postprandial average -  Exercising regularly? - Yes Watching carbohydrate intake? - Yes Neuropathy ? - No Hypoglycemic events - No  - Recovers with :   Pertinent ROS:  Polyuria - No Polydipsia - No Vision problems - No  Medications as noted below. Taking them regularly without complication/adverse reaction being reported today.   History Denise Mosley has a past medical history of Bell's palsy, Diabetes mellitus without complication ([place]), Hyperlipidemia, and Hypertension.   She has a past surgical history that includes Cesarean section and Cholecystectomy.   Her family history includes Diabetes in her father and mother.She reports that she has quit smoking. She has never used smokeless tobacco. She reports that she does not drink alcohol and does not use drugs.  Current Outpatient Medications on File Prior to Visit  Medication Sig Dispense Refill   Blood Glucose Monitoring Suppl (TRUE METRIX AIR GLUCOSE METER) w/Device KIT 1 Bag by Does not apply route 3 (three) times daily. 1 kit 0   Blood Glucose Monitoring Suppl (TRUE METRIX METER) w/Device KIT Use as instructed 1 kit 0   ezetimibe (ZETIA) 10 MG tablet TAKE 1 TABLET (10 MG TOTAL) BY MOUTH DAILY. 90 tablet 1   fenofibrate 160 MG tablet Take 1 tablet (160 mg total) by mouth daily. 90 tablet 1   fluticasone (FLONASE) 50 MCG/ACT nasal spray Place 2 sprays into both nostrils daily. 16 g 6   Insulin Pen Needle (B-D UF III MINI PEN NEEDLES) 31G X 5 MM MISC Use as instructed 100 each 5   lisinopril (ZESTRIL) 2.5 MG tablet TAKE 1 TABLET (2.5  MG TOTAL) BY MOUTH DAILY. 90 tablet 1   loratadine (CLARITIN) 10 MG tablet Take 1 tablet (10 mg total) by mouth daily. 90 tablet 3   metFORMIN (GLUCOPHAGE) 1000 MG tablet Take 1 tablet (1,000 mg total) by mouth 2 (two) times daily with a meal. 180 tablet 3   TRUEplus Lancets 28G MISC Use as instructed to check blood sugar. 100 each 6   No current facility-administered medications on file prior to visit.    ROS Comprehensive ROS Pertinent positive and negative noted in HPI    Objective:  BP 115/72   Pulse 60   Temp 98.1 F (36.7 C) (Oral)   Ht _0  (1.422 m)   Wt 174 lb 3.2 oz (79 kg)   SpO2 93%   BMI 39.05 kg/m   BP Readings from Last 3 Encounters:  04/05/22 115/72  12/31/21 113/71  10/01/21 128/78    Wt Readings from Last 3 Encounters:  04/05/22 174 lb 3.2 oz (79 kg)  12/31/21 172 lb 3.2 oz (78.1 kg)  10/01/21 177 lb 12.8 oz (80.6 kg)    Physical Exam Vitals reviewed.  Constitutional:      Appearance: She is obese.  HENT:     Head: Normocephalic.     Right Ear: Tympanic membrane and external ear normal.     Left Ear: Tympanic membrane and external ear normal.     Nose: Nose normal.  Eyes:     Extraocular Movements: Extraocular  movements intact.  Cardiovascular:     Rate and Rhythm: Normal rate and regular rhythm.  Pulmonary:     Effort: Pulmonary effort is normal.     Breath sounds: Normal breath sounds.  Abdominal:     General: Bowel sounds are normal. There is distension.     Palpations: Abdomen is soft.  Musculoskeletal:        General: Normal range of motion.     Cervical back: Normal range of motion and neck supple.  Skin:    General: Skin is warm and dry.  Neurological:     Mental Status: She is alert and oriented to person, place, and time.  Psychiatric:        Mood and Affect: Mood normal.        Behavior: Behavior normal.        Thought Content: Thought content normal.    Lab Results  Component Value Date   HGBA1C 9.5 (A) 04/05/2022    HGBA1C 7.3 (A) 12/18/2021   HGBA1C 10.1 (A) 10/01/2021    Lab Results  Component Value Date   WBC 6.7 04/05/2022   HGB 14.9 04/05/2022   HCT 44.6 04/05/2022   PLT 222 04/05/2022   GLUCOSE 173 (H) 04/05/2022   CHOL 131 04/05/2022   TRIG 226 (H) 04/05/2022   HDL 40 04/05/2022   LDLCALC 55 04/05/2022   ALT 21 04/05/2022   AST 23 04/05/2022   NA 141 04/05/2022   K 4.5 04/05/2022   CL 103 04/05/2022   CREATININE 0.65 04/05/2022   BUN 14 04/05/2022   CO2 23 04/05/2022   TSH 2.610 08/14/2017   INR 0.98 01/14/2017   HGBA1C 9.5 (A) 04/05/2022     Assessment & Plan:   Chan was seen today for diabetes.  Diagnoses and all orders for this visit:  Type 2 diabetes mellitus without complication, with long-term current use of insulin ([facility]) -     HgB A1c 9.5 Discussed  co- morbidities with uncontrol diabetes  Complications -diabetic retinopathy, (close your eyes ? What do you see nothing) nephropathy decrease in kidney function- can lead to dialysis-on a machine 3 days a week to filter your kidney, neuropathy- numbness and tinging in your hands and feet,  increase risk of heart attack and stroke, and amputation due to decrease wound healing and circulation. Decrease your risk by taking medication daily as prescribed, monitor carbohydrates- foods that are high in carbohydrates are the following rice, potatoes, breads, sugars, and pastas.  Reduction in the intake (eating) will assist in lowering your blood sugars. Exercise daily at least 30 minutes daily.  Added empagliflozin (JARDIANCE) 10 MG TABS tablet -     Dulaglutide (TRULICITY) 1.5 TM/5.4YT SOPN; Inject 1.5 mg into the skin once a week.  Elevated lipoprotein(a)  Healthy lifestyle diet of fruits vegetables fish nuts whole grains and low saturated fat . Foods high in cholesterol or liver, fatty meats,cheese, butter avocados, nuts and seeds, chocolate and fried foods. -     atorvastatin (LIPITOR) 80 MG tablet; TAKE 1 TABLET (80 MG TOTAL) BY  MOUTH DAILY.  Medication refill -     atorvastatin (LIPITOR) 80 MG tablet; TAKE 1 TABLET (80 MG TOTAL) BY MOUTH DAILY.  Other orders -     empagliflozin (JARDIANCE) 10 MG TABS tablet; Take 1 tablet (10 mg total) by mouth daily before breakfast. -     albuterol (VENTOLIN HFA) 108 (90 Base) MCG/ACT inhaler; Inhale 1-2 puffs into the lungs every 6 (six) hours as  needed for wheezing or shortness of breath.   I have discontinued Porshea Medina-Gomez's Semaglutide (2 MG/DOSE). I have also changed her albuterol. Additionally, I am having her maintain her True Metrix Meter, B-D UF III MINI PEN NEEDLES, True Metrix Air Glucose Meter, TRUEplus Lancets 28G, fluticasone, loratadine, ezetimibe, fenofibrate, lisinopril, metFORMIN, Trulicity, empagliflozin, and atorvastatin.  Meds ordered this encounter  Medications   Dulaglutide (TRULICITY) 1.5 LE/7.5TZ SOPN    Sig: Inject 1.5 mg into the skin once a week.    Dispense:  2 mL    Refill:  2    To hold patient over until Ozempic is approved for PASS. Please do not d/c Ozempic off profile.   empagliflozin (JARDIANCE) 10 MG TABS tablet    Sig: Take 1 tablet (10 mg total) by mouth daily before breakfast.    Dispense:  90 tablet    Refill:  1   atorvastatin (LIPITOR) 80 MG tablet    Sig: TAKE 1 TABLET (80 MG TOTAL) BY MOUTH DAILY.    Dispense:  90 tablet    Refill:  1   albuterol (VENTOLIN HFA) 108 (90 Base) MCG/ACT inhaler    Sig: Inhale 1-2 puffs into the lungs every 6 (six) hours as needed for wheezing or shortness of breath.    Dispense:  18 g    Refill:  1     Follow-up:   Return in about 3 months (around 07/06/2022) for DM.  The above assessment and management plan was discussed with the patient. The patient verbalized understanding of and has agreed to the management plan. Patient is aware to call the clinic if symptoms fail to improve or worsen. Patient is aware when to return to the clinic for a follow-up visit. Patient educated on when it is  appropriate to go to the emergency department.   Juluis Mire, NP-C

## 2022-04-06 LAB — LIPID PANEL
Chol/HDL Ratio: 3.3 ratio (ref 0.0–4.4)
Cholesterol, Total: 131 mg/dL (ref 100–199)
HDL: 40 mg/dL (ref >39)
LDL Chol Calc (NIH): 55 mg/dL (ref 0–99)
Triglycerides: 226 mg/dL — ABNORMAL HIGH (ref 0–149)
VLDL Cholesterol Cal: 36 mg/dL (ref 5–40)

## 2022-04-06 LAB — CBC WITH DIFFERENTIAL/PLATELET
Basophils Absolute: 0 10*3/uL (ref 0.0–0.2)
Basos: 0 %
EOS (ABSOLUTE): 0.3 10*3/uL (ref 0.0–0.4)
Eos: 4 %
Hematocrit: 44.6 % (ref 34.0–46.6)
Hemoglobin: 14.9 g/dL (ref 11.1–15.9)
Immature Grans (Abs): 0 10*3/uL (ref 0.0–0.1)
Immature Granulocytes: 0 %
Lymphocytes Absolute: 2.7 10*3/uL (ref 0.7–3.1)
Lymphs: 40 %
MCH: 29.9 pg (ref 26.6–33.0)
MCHC: 33.4 g/dL (ref 31.5–35.7)
MCV: 89 fL (ref 79–97)
Monocytes Absolute: 0.5 10*3/uL (ref 0.1–0.9)
Monocytes: 7 %
Neutrophils Absolute: 3.2 10*3/uL (ref 1.4–7.0)
Neutrophils: 49 %
Platelets: 222 10*3/uL (ref 150–450)
RBC: 4.99 x10E6/uL (ref 3.77–5.28)
RDW: 12.8 % (ref 11.7–15.4)
WBC: 6.7 10*3/uL (ref 3.4–10.8)

## 2022-04-06 LAB — CMP14+EGFR
ALT: 21 IU/L (ref 0–32)
AST: 23 IU/L (ref 0–40)
Albumin/Globulin Ratio: 1.5 (ref 1.2–2.2)
Albumin: 4.6 g/dL (ref 3.8–4.9)
Alkaline Phosphatase: 159 IU/L — ABNORMAL HIGH (ref 44–121)
BUN/Creatinine Ratio: 22 (ref 9–23)
BUN: 14 mg/dL (ref 6–24)
Bilirubin Total: 0.4 mg/dL (ref 0.0–1.2)
CO2: 23 mmol/L (ref 20–29)
Calcium: 10.1 mg/dL (ref 8.7–10.2)
Chloride: 103 mmol/L (ref 96–106)
Creatinine, Ser: 0.65 mg/dL (ref 0.57–1.00)
Globulin, Total: 3 g/dL (ref 1.5–4.5)
Glucose: 173 mg/dL — ABNORMAL HIGH (ref 70–99)
Potassium: 4.5 mmol/L (ref 3.5–5.2)
Sodium: 141 mmol/L (ref 134–144)
Total Protein: 7.6 g/dL (ref 6.0–8.5)
eGFR: 102 mL/min/{1.73_m2} (ref >59)

## 2022-04-10 ENCOUNTER — Other Ambulatory Visit: Payer: Self-pay

## 2022-04-22 ENCOUNTER — Other Ambulatory Visit (INDEPENDENT_AMBULATORY_CARE_PROVIDER_SITE_OTHER): Payer: Self-pay | Admitting: Primary Care

## 2022-04-22 DIAGNOSIS — Z760 Encounter for issue of repeat prescription: Secondary | ICD-10-CM

## 2022-04-22 DIAGNOSIS — E7841 Elevated Lipoprotein(a): Secondary | ICD-10-CM

## 2022-05-08 ENCOUNTER — Other Ambulatory Visit: Payer: Self-pay

## 2022-06-21 ENCOUNTER — Other Ambulatory Visit: Payer: Self-pay

## 2022-07-02 ENCOUNTER — Other Ambulatory Visit: Payer: Self-pay

## 2022-07-08 ENCOUNTER — Ambulatory Visit (INDEPENDENT_AMBULATORY_CARE_PROVIDER_SITE_OTHER): Payer: Self-pay | Admitting: Primary Care

## 2022-07-08 ENCOUNTER — Other Ambulatory Visit: Payer: Self-pay

## 2022-07-08 ENCOUNTER — Encounter (INDEPENDENT_AMBULATORY_CARE_PROVIDER_SITE_OTHER): Payer: Self-pay | Admitting: Primary Care

## 2022-07-08 VITALS — BP 114/77 | HR 71 | Resp 16 | Wt 176.6 lb

## 2022-07-08 DIAGNOSIS — E119 Type 2 diabetes mellitus without complications: Secondary | ICD-10-CM

## 2022-07-08 DIAGNOSIS — Z794 Long term (current) use of insulin: Secondary | ICD-10-CM

## 2022-07-08 DIAGNOSIS — Z760 Encounter for issue of repeat prescription: Secondary | ICD-10-CM

## 2022-07-08 DIAGNOSIS — Z1211 Encounter for screening for malignant neoplasm of colon: Secondary | ICD-10-CM

## 2022-07-08 DIAGNOSIS — Z6839 Body mass index (BMI) 39.0-39.9, adult: Secondary | ICD-10-CM

## 2022-07-08 LAB — POCT GLYCOSYLATED HEMOGLOBIN (HGB A1C): HbA1c, POC (controlled diabetic range): 7.7 % — AB (ref 0.0–7.0)

## 2022-07-08 MED ORDER — LISINOPRIL 2.5 MG PO TABS
2.5000 mg | ORAL_TABLET | Freq: Every day | ORAL | 1 refills | Status: DC
  Filled 2022-07-08: qty 90, 90d supply, fill #0
  Filled 2022-11-18: qty 90, 90d supply, fill #1

## 2022-07-08 MED ORDER — EMPAGLIFLOZIN 10 MG PO TABS
10.0000 mg | ORAL_TABLET | Freq: Every day | ORAL | 1 refills | Status: DC
  Filled 2022-07-08: qty 90, 90d supply, fill #0

## 2022-07-08 MED ORDER — METFORMIN HCL 1000 MG PO TABS
1000.0000 mg | ORAL_TABLET | Freq: Two times a day (BID) | ORAL | 3 refills | Status: DC
  Filled 2022-07-08 – 2022-09-03 (×2): qty 180, 90d supply, fill #0
  Filled 2022-12-16: qty 180, 90d supply, fill #1

## 2022-07-08 MED ORDER — TRULICITY 1.5 MG/0.5ML ~~LOC~~ SOAJ
1.5000 mg | SUBCUTANEOUS | 2 refills | Status: DC
  Filled 2022-07-08: qty 2, 28d supply, fill #0

## 2022-07-08 NOTE — Patient Instructions (Signed)
Recuento de caloras para bajar de peso Calorie Counting for Weight Loss Las caloras son unidades de energa. El cuerpo necesita una cierta cantidad de caloras de los alimentos para que lo ayuden a funcionar durante todo el da. Cuando se comen o beben ms caloras de las que el cuerpo necesita, este acumula las caloras adicionales mayormente como grasa. Cuando se comen o beben menos caloras de las que el cuerpo necesita, este quema grasa para obtener la energa que necesita. El recuento de caloras es el registro de la cantidad de caloras que se comen y beben cada da. El recuento de caloras puede ser de ayuda si necesita perder peso. Si come menos caloras de las que el cuerpo necesita, debera bajar de peso. Pregntele al mdico cul es un peso sano para usted. Para que el recuento de caloras funcione, usted tendr que ingerir la cantidad de caloras adecuadas cada da, para bajar una cantidad de peso saludable por semana. Un nutricionista puede ayudar a determinar la cantidad de caloras que usted necesita por da y sugerirle formas de alcanzar su objetivo calrico. Una cantidad de peso saludable para bajar cada semana suele ser entre 1 y 2 libras (0.5 a 0.9 kg). Esto habitualmente significa que su ingesta diaria de caloras se debera reducir en unas 500 a 750 caloras. Ingerir de 1200 a 1500 caloras por da puede ayudar a la mayora de las mujeres a bajar de peso. Ingerir de 1500 a 1800 caloras por da puede ayudar a la mayora de los hombres a bajar de peso. Qu debo saber acerca del recuento de caloras? Trabaje con el mdico o el nutricionista para determinar cuntas caloras debe recibir cada da. A fin de alcanzar su objetivo diario de caloras, tendr que: Averiguar cuntas caloras hay en cada alimento que le gustara comer. Intente hacerlo antes de comer. Decidir la cantidad que puede comer del alimento. Llevar un registro de los alimentos. Para esto, anote lo que comi y cuntas  caloras tena. Para perder peso con xito, es importante equilibrar el recuento de caloras con un estilo de vida saludable que incluya actividad fsica de forma regular. Dnde encuentro informacin sobre las caloras?  Es posible encontrar la cantidad de caloras que contiene un alimento en la etiqueta de informacin nutricional. Si un alimento no tiene una etiqueta de informacin nutricional, intente buscar las caloras en Internet o pida ayuda al nutricionista. Recuerde que las caloras se calculan por porcin. Si opta por comer ms de una porcin de un alimento, tendr que multiplicar las caloras de una porcin por la cantidad de porciones que planea comer. Por ejemplo, la etiqueta de un envase de pan puede decir que el tamao de una porcin es 1 rodaja, y que una porcin tiene 90 caloras. Si come 1 rodaja, habr comido 90 caloras. Si come 2 rodajas, habr comido 180 caloras. Cmo llevo un registro de comidas? Despus de cada vez que coma, anote lo siguiente en el registro de alimentos lo antes posible: Lo que comi. Asegrese de incluir los aderezos, las salsas y otros extras en los alimentos. La cantidad que comi. Esto se puede medir en tazas, onzas o cantidad de alimentos. Cuntas caloras haba en cada alimento y en cada bebida. La cantidad total de caloras en la comida que tom. Tenga a mano el registro de alimentos, por ejemplo, en un anotador de bolsillo o utilice una aplicacin o sitio web en el telfono mvil. Algunos programas calcularn las caloras por usted y le mostrarn la cantidad de   caloras que le quedan para llegar al objetivo diario. Cules son algunos consejos para controlar las porciones? Sepa cuntas caloras hay en una porcin. Esto lo ayudar a saber cuntas porciones de un alimento determinado puede comer. Use una taza medidora para medir los tamaos de las porciones. Tambin puede intentar pesar las porciones en una balanza de cocina. Con el tiempo, podr hacer  un clculo estimativo de los tamaos de las porciones de algunos alimentos. Dedique tiempo a poner porciones de diferentes alimentos en sus platos, tazones y tazas predilectos, a fin de saber cmo se ve una porcin. Intente no comer directamente de un envase de alimentos, por ejemplo, de una bolsa o una caja. Comer directamente del envase dificulta ver cunto est comiendo y puede conducir a comer en exceso. Ponga la cantidad que le gustara comer en una taza o un plato, a fin de asegurarse de que est comiendo la porcin correcta. Use platos, vasos y tazones ms pequeos para medir porciones ms pequeas y evitar no comer en exceso. Intente no realizar varias tareas al mismo tiempo. Por ejemplo, evite mirar televisin o usar la computadora mientras come. Si es la hora de comer, sintese a la mesa y disfrute de la comida. Esto lo ayudar a reconocer cundo est satisfecho. Tambin le permitir estar ms consciente de qu come y cunto come. Consejos para seguir este plan Al leer las etiquetas de los alimentos Controle el recuento de caloras en comparacin con el tamao de la porcin. El tamao de la porcin puede ser ms pequeo de lo que suele comer. Verifique la fuente de las caloras. Intente elegir alimentos ricos en protenas, fibras y vitaminas, y bajos en grasas saturadas, grasas trans y sodio. Al ir de compras Lea las etiquetas nutricionales cuando compre. Esto lo ayudar a tomar decisiones saludables sobre qu alimentos comprar. Preste atencin a las etiquetas nutricionales de alimentos bajos en grasas o sin grasas. Estos alimentos a veces tienen la misma cantidad de caloras o ms caloras que las versiones ricas en grasas. Con frecuencia, tambin tienen agregados de azcar, almidn o sal, para darles el sabor que fue eliminado con las grasas. Haga una lista de compras con los alimentos que tienen un menor contenido de caloras y resptela. Al cocinar Intente cocinar sus alimentos preferidos  de una manera ms saludable. Por ejemplo, pruebe hornear en vez de frer. Utilice productos lcteos descremados. Planificacin de las comidas Utilice ms frutas y verduras. La mitad de su plato debe ser de frutas y verduras. Incluya protenas magras, como pollo, pavo y pescado. Estilo de vida Cada semana, trate de hacer una de las siguientes cosas: 150 minutos de ejercicio moderado, como caminar. 75 minutos de ejercicio enrgico, como correr. Informacin general Sepa cuntas caloras tienen los alimentos que come con ms frecuencia. Esto le ayudar a contar las caloras ms rpidamente. Encuentre un mtodo para controlar las caloras que funcione para usted. Sea creativo. Pruebe aplicaciones o programas distintos, si llevar un registro de las caloras no funciona para usted. Qu alimentos debo consumir?  Consuma alimentos nutritivos. Es mejor comer un alimento nutritivo, de alto contenido calrico, como un aguacate, que uno con pocos nutrientes, como una bolsa de patatas fritas. Use sus caloras en alimentos y bebidas que lo sacien y no lo dejen con apetito apenas termina de comer. Ejemplos de alimentos que lo sacian son los frutos secos y mantequillas de frutos secos, verduras, protenas magras y alimentos con alto contenido de fibra como los cereales integrales. Los alimentos con alto   contenido de fibra son aquellos que tienen ms de 5 g de fibra por porcin. Preste atencin a las caloras en las bebidas. Las bebidas de bajas caloras incluyen agua y refrescos sin azcar. Es posible que los productos que se enumeran ms arriba no constituyan una lista completa de los alimentos y las bebidas que puede tomar. Consulte a un nutricionista para obtener ms informacin. Qu alimentos debo limitar? Limite el consumo de alimentos o bebidas que no sean buenas fuentes de vitaminas, minerales o protenas, o que tengan alto contenido de grasas no saludables. Estos incluyen: Caramelos. Otros  dulces. Refrescos, bebidas con caf especiales, alcohol y jugo. Es posible que los productos que se enumeran ms arriba no constituyan una lista completa de los alimentos y las bebidas que debe evitar. Consulte a un nutricionista para obtener ms informacin. Cmo puedo hacer el recuento de caloras cuando como afuera? Preste atencin a las porciones. A menudo, las porciones son mucho ms grandes al comer afuera. Pruebe con estos consejos para mantener las porciones ms pequeas: Considere la posibilidad de compartir una comida en lugar de tomarla toda usted solo. Si pide su propia comida, coma solo la mitad. Antes de empezar a comer, pida un recipiente y ponga la mitad de la comida en l. Cuando sea posible, considere la posibilidad de pedir porciones ms pequeas del men en lugar de porciones completas. Preste atencin a la eleccin de alimentos y bebidas. Saber la forma en que se cocinan los alimentos y lo que incluye la comida puede ayudarlo a ingerir menos caloras. Si se detallan las caloras en el men, elija las opciones que contengan la menor cantidad. Elija platos que incluyan verduras, frutas, cereales integrales, productos lcteos con bajo contenido de grasa y protenas magras. Opte por los alimentos hervidos, asados, cocidos a la parrilla o al vapor. Evite los alimentos a los que se les ponga mantequilla, que estn empanados o fritos, o que se sirvan con salsa a base de crema. Generalmente, los alimentos que se etiquetan como "crujientes" estn fritos, a menos que se indique lo contrario. Elija el agua, la leche descremada, el t helado sin azcar u otras bebidas que no contengan azcares agregados. Si desea una bebida alcohlica, escoja una opcin con menos caloras, como una copa de vino o una cerveza ligera. Ordene los aderezos, las salsas y los jarabes aparte. Estos son, con frecuencia, de alto contenido en caloras, por lo que debe limitar la cantidad que ingiere. Si desea una  ensalada, elija una de hortalizas y pida carnes a la parrilla. Evite las guarniciones adicionales como el tocino, el queso o los alimentos fritos. Ordene el aderezo aparte o pida aceite de oliva y vinagre o limn para aderezar. Haga un clculo estimativo de la cantidad de porciones que le sirven. Conocer el tamao de las porciones lo ayudar a estar atento a la cantidad de comida que come en los restaurantes. Dnde buscar ms informacin Centers for Disease Control and Prevention (Centros para el Control y la Prevencin de Enfermedades): www.cdc.gov U.[place]. Department of Agriculture (Departamento de Agricultura de los [place]): myplate.gov Resumen El recuento de caloras es el registro de la cantidad de caloras que se comen y beben cada da. Si come menos caloras de las que el cuerpo necesita, debera bajar de peso. Una cantidad de peso saludable para bajar por semana suele ser entre 1 y 2 libras (0.5 a 0.9 kg). Esto significa, con frecuencia, reducir su ingesta diaria de caloras unas 500 a 750 caloras. Es posible   encontrar la cantidad de caloras que contiene un alimento en la etiqueta de informacin nutricional. Si un alimento no tiene una etiqueta de informacin nutricional, intente buscar las caloras en Internet o pida ayuda al nutricionista. Use platos, vasos y tazones ms pequeos para medir porciones ms pequeas y evitar no comer en exceso. Use sus caloras en alimentos y bebidas que lo sacien y no lo dejen con apetito poco tiempo despus de haber comido. Esta informacin no tiene como fin reemplazar el consejo del mdico. Asegrese de hacerle al mdico cualquier pregunta que tenga. Document Revised: 12/27/2019 Document Reviewed: 12/27/2019 Elsevier Patient Education  2023 Elsevier Inc.  

## 2022-07-08 NOTE — Progress Notes (Signed)
Denise Mosley, is a 61 y.o. female  BDZ:[id number]  [id number]  DOB - 1961/08/02  Chief Complaint  Patient presents with   Diabetes       Subjective:  Denise Mosley  [id number]  Denise Mosley is a 61 y.o. female here today for a follow up visit. Patient has No headache, No chest pain, No abdominal pain - No Nausea, No new weakness tingling or numbness, No Cough - shortness of breath  No problems updated.  No Known Allergies  Past Medical History:  Diagnosis Date   Bell's palsy    Diabetes mellitus without complication (HCC)    Hyperlipidemia    Hypertension     Current Outpatient Medications on File Prior to Visit  Medication Sig Dispense Refill   albuterol (VENTOLIN HFA) 108 (90 Base) MCG/ACT inhaler Inhale 1-2 puffs into the lungs every 6 (six) hours as needed for wheezing or shortness of breath. 18 g 1   atorvastatin (LIPITOR) 80 MG tablet TAKE 1 TABLET (80 MG TOTAL) BY MOUTH DAILY. 90 tablet 1   Blood Glucose Monitoring Suppl (TRUE METRIX AIR GLUCOSE METER) w/Device KIT 1 Bag by Does not apply route 3 (three) times daily. 1 kit 0   Blood Glucose Monitoring Suppl (TRUE METRIX METER) w/Device KIT Use as instructed 1 kit 0   ezetimibe (ZETIA) 10 MG tablet TAKE 1 TABLET (10 MG TOTAL) BY MOUTH DAILY. 90 tablet 1   fenofibrate 160 MG tablet Take 1 tablet (160 mg total) by mouth daily. 90 tablet 1   fluticasone (FLONASE) 50 MCG/ACT nasal spray Place 2 sprays into both nostrils daily. 16 g 6   Insulin Pen Needle (B-D UF III MINI PEN NEEDLES) 31G X 5 MM MISC Use as instructed 100 each 5   loratadine (CLARITIN) 10 MG tablet Take 1 tablet (10 mg total) by mouth daily. 90 tablet 3   TRUEplus Lancets 28G MISC Use as instructed to check blood sugar. 100 each 6   No current facility-administered medications on file prior to visit.    Objective:   Vitals:   07/08/22 0943  BP: 114/77  Pulse: 71  Resp: 16  SpO2: 96%  Weight: 176 lb 9.6 oz (80.1 kg)     Exam General appearance : Awake, alert, not in any distress. Speech Clear. Not toxic looking HEENT: Atraumatic and Normocephalic, pupils equally reactive to light and accomodation Neck: Supple, no JVD. No cervical lymphadenopathy.  Chest: Good air entry bilaterally, no added sounds  CVS: S1 S2 regular, no murmurs.  Abdomen: Bowel sounds present, Non tender and not distended with no gaurding, rigidity or rebound. Extremities: B/L Lower Ext shows no edema, both legs are warm to touch Neurology: Awake alert, and oriented X 3, CN II-XII intact, Non focal Skin: No Rash  Data Review Lab Results  Component Value Date   HGBA1C 7.7 (A) 07/08/2022   HGBA1C 9.5 (A) 04/05/2022   HGBA1C 7.3 (A) 12/18/2021    Assessment & Plan  [place] was seen today for diabetes.  Diagnoses and all orders for this visit:  Type 2 diabetes mellitus without complication, with long-term current use of insulin (HCC) -     POCT glycosylated hemoglobin (Hb A1C) down from 9.5 to 7.7  -     Microalbumin / creatinine urine ratio -     Ambulatory referral to Ophthalmology -     metFORMIN (GLUCOPHAGE) 1000 MG tablet; Take 1 tablet (1,000 mg total) by mouth 2 (two) times daily with a meal. -  lisinopril (ZESTRIL) 2.5 MG tablet; Take 1 tablet (2.5 mg total) by mouth daily. -     Dulaglutide (TRULICITY) 1.5 JL/5.9DI SOPN; Inject 1.5 mg into the skin once a week.  Morbid obesity ([name]) Discussed diet and exercise for person with BMI >25. Instructed: You must burn more calories than you eat. Losing 5 percent of your body weight should be considered a success. In the longer term, losing more than 15 percent of your body weight and staying at this weight is an extremely good result. However, keep in mind that even losing 5 percent of your body weight leads to important health benefits, so try not to get discouraged if you're not able to lose more than this. Will recheck weight in 3-6 months.  Colon cancer screening -      Fecal occult blood, imunochemical; Future  Medication refill -     metFORMIN (GLUCOPHAGE) 1000 MG tablet; Take 1 tablet (1,000 mg total) by mouth 2 (two) times daily with a meal. -     lisinopril (ZESTRIL) 2.5 MG tablet; Take 1 tablet (2.5 mg total) by mouth daily.  Other orders -     empagliflozin (JARDIANCE) 10 MG TABS tablet; Take 1 tablet (10 mg total) by mouth daily before breakfast.     Patient have been counseled extensively about nutrition and exercise. Other issues discussed during this visit include: low cholesterol diet, weight control and daily exercise, foot care, annual eye examinations at Ophthalmology, importance of adherence with medications and regular follow-up. We also discussed long term complications of uncontrolled diabetes and hypertension.   Return in about 3 months (around 10/08/2022) for DM/ HTN .  The patient was given clear instructions to go to ER or return to medical center if symptoms don't improve, worsen or new problems develop. The patient verbalized understanding. The patient was told to call to get lab results if they haven't heard anything in the next week.   This note has been created with Surveyor, quantity. Any transcriptional errors are unintentional.   [name], NP 07/09/2022, 3:06 PM

## 2022-07-09 LAB — MICROALBUMIN / CREATININE URINE RATIO
Creatinine, Urine: 38.6 mg/dL
Microalb/Creat Ratio: <8 mg/g creat (ref 0–29)
Microalbumin, Urine: <3 ug/mL

## 2022-07-10 ENCOUNTER — Other Ambulatory Visit: Payer: Self-pay

## 2022-07-31 ENCOUNTER — Other Ambulatory Visit (INDEPENDENT_AMBULATORY_CARE_PROVIDER_SITE_OTHER): Payer: Self-pay

## 2022-07-31 DIAGNOSIS — Z1211 Encounter for screening for malignant neoplasm of colon: Secondary | ICD-10-CM

## 2022-07-31 LAB — FECAL OCCULT BLOOD, GUAIAC: Fecal Occult Blood: NEGATIVE

## 2022-08-02 LAB — FECAL OCCULT BLOOD, IMMUNOCHEMICAL: Fecal Occult Bld: NEGATIVE

## 2022-08-02 LAB — SPECIMEN STATUS REPORT

## 2022-08-13 ENCOUNTER — Other Ambulatory Visit: Payer: Self-pay

## 2022-08-13 ENCOUNTER — Ambulatory Visit: Payer: No Typology Code available for payment source | Admitting: Pharmacist

## 2022-08-13 NOTE — Progress Notes (Deleted)
S:    Denise Mosley is a 61 y.o. female who presents for diabetes evaluation, education, and management. PMH is significant for T2DM, HLD, HTN, hx cholecystectomy. Patient was referred and last seen by Primary Care Provider, Gwinda Passe, NP, on 07/08/2022. A1c at that visit was 7.7% (down from 9.5% on 04/05/22).  Today, the patient arrives in good spirits. She was on Ozempic previously but this had to be changed to [employer] issues. She is adherent with her Jardiance and metformin. Of note, her A1c rose to above goal level in July. Per pharmacy, she had gaps in her fill history prior to this. She has since been filling and taking her medications consistently and her A1c is close to goal. Reports BG are much improved and she is feeling better ***. Endorses compliance with metformin and Jardiance as well. Denies any NV, abdominal pain. No changes in vision.   Family/Social History:  -Former smoker -DM in mother and father  Current diabetes medications include: metformin 1000 mg BID, Trulicity 1.5mg  weekly, Jardiance 10 mg daily Current hypertension medications include: lisinopril 2.5 mg daily Current hyperlipidemia medications include: atorvastatin 80 mg daily, ezetimibe 10 mg daily, fenofibrate 160 mg daily  Patient states that they are taking their medications as prescribed. Patient reports adherence with medications.   Insurance coverage: self pay  Patient denies hypoglycemic events.  Reported home fasting blood sugars: usually 120s-130s  Patient denies nocturia (nighttime urination).  Patient denies neuropathy (nerve pain). Patient denies visual changes. Patient reports self foot exams.   Patient reported dietary habits: Reports normal appetite. Eats 2 meals/day (breakfast, late afternoon meal). No changes in diet since last visit.  Patient-reported exercise habits: has been walking when the weather is warmer; does exercises at home when it's cooler  O:    Lab Results  Component Value Date   HGBA1C 7.7 (A) 07/08/2022   There were no vitals filed for this visit.  Lipid Panel     Component Value Date/Time   CHOL 131 04/05/2022 0943   TRIG 226 (H) 04/05/2022 0943   HDL 40 04/05/2022 0943   CHOLHDL 3.3 04/05/2022 0943   LDLCALC 55 04/05/2022 0943    Clinical Atherosclerotic Cardiovascular Disease (ASCVD): No  The 10-year ASCVD risk score (Arnett DK, et al., 2019) is: 5.4%   Values used to calculate the score:     Age: 35 years     Sex: Female     Is Non-Hispanic African American: No     Diabetic: Yes     Tobacco smoker: No     Systolic Blood Pressure: 114 mmHg     Is BP treated: Yes     HDL Cholesterol: 40 mg/dL     Total Cholesterol: 131 mg/dL    A/P: Diabetes longstanding currently close to goal with A1c of 7.3%. Commended patient on this!! Patient is able to verbalize appropriate hypoglycemia management plan. Medication adherence appears appropriate. Adherence encouraged - pt will follow-up with her PCP. -Continue current regimen. -Patient educated on purpose, proper use, potential adverse effects, and sick day rules for Jardiance.  -Extensively discussed pathophysiology of diabetes, recommended lifestyle interventions, dietary effects on blood sugar control.  -Counseled on s/sx of and management of hypoglycemia.  -Next A1c anticipated July 2023.   Written patient instructions provided. Patient verbalized understanding of treatment plan. Total time in face to face counseling 30 minutes.    Follow up PCP clinic visit 12/31/21.   Butch Penny, PharmD, BCACP, CPP Clinical Pharmacist [facility] [phone]

## 2022-08-20 ENCOUNTER — Ambulatory Visit (INDEPENDENT_AMBULATORY_CARE_PROVIDER_SITE_OTHER): Payer: No Typology Code available for payment source | Admitting: Primary Care

## 2022-09-03 ENCOUNTER — Other Ambulatory Visit: Payer: Self-pay

## 2022-10-02 ENCOUNTER — Other Ambulatory Visit: Payer: Self-pay

## 2022-10-08 ENCOUNTER — Other Ambulatory Visit: Payer: Self-pay

## 2022-10-08 ENCOUNTER — Ambulatory Visit (INDEPENDENT_AMBULATORY_CARE_PROVIDER_SITE_OTHER): Payer: Self-pay | Admitting: Primary Care

## 2022-10-08 ENCOUNTER — Encounter (INDEPENDENT_AMBULATORY_CARE_PROVIDER_SITE_OTHER): Payer: Self-pay | Admitting: Primary Care

## 2022-10-08 VITALS — BP 116/76 | HR 69 | Resp 16

## 2022-10-08 DIAGNOSIS — E1169 Type 2 diabetes mellitus with other specified complication: Secondary | ICD-10-CM

## 2022-10-08 DIAGNOSIS — E119 Type 2 diabetes mellitus without complications: Secondary | ICD-10-CM

## 2022-10-08 DIAGNOSIS — I10 Essential (primary) hypertension: Secondary | ICD-10-CM

## 2022-10-08 DIAGNOSIS — Z2821 Immunization not carried out because of patient refusal: Secondary | ICD-10-CM

## 2022-10-08 DIAGNOSIS — Z794 Long term (current) use of insulin: Secondary | ICD-10-CM

## 2022-10-08 DIAGNOSIS — Z760 Encounter for issue of repeat prescription: Secondary | ICD-10-CM

## 2022-10-08 DIAGNOSIS — E781 Pure hyperglyceridemia: Secondary | ICD-10-CM

## 2022-10-08 DIAGNOSIS — E7841 Elevated Lipoprotein(a): Secondary | ICD-10-CM

## 2022-10-08 DIAGNOSIS — W182XXA Fall in (into) shower or empty bathtub, initial encounter: Secondary | ICD-10-CM

## 2022-10-08 DIAGNOSIS — E785 Hyperlipidemia, unspecified: Secondary | ICD-10-CM

## 2022-10-08 LAB — POCT GLYCOSYLATED HEMOGLOBIN (HGB A1C): HbA1c, POC (controlled diabetic range): 8.2 % — AB (ref 0.0–7.0)

## 2022-10-08 MED ORDER — TRULICITY 1.5 MG/0.5ML ~~LOC~~ SOAJ
1.5000 mg | SUBCUTANEOUS | 2 refills | Status: DC
  Filled 2022-10-08: qty 2, 28d supply, fill #0
  Filled 2022-11-18: qty 2, 28d supply, fill #1
  Filled 2022-12-16: qty 2, 28d supply, fill #2

## 2022-10-08 NOTE — Progress Notes (Unsigned)
Subjective:  Patient ID: Denise Mosley, female    DOB: 01-02-1961  Age: 62 y.o. MRN: [id number]  CC: Diabetes and Fall [place] on Sunday and hurt left hip )   HPI Denise Mosley is a Obese Hispanic female Darrick Meigs [phone]) presents forFollow-up of diabetes. Patient does not check blood sugar at home  Compliant with meds - Yes Checking CBGs? Yes  Fasting avg - 120-135   Postprandial average -  Exercising regularly? - Yes Watching carbohydrate intake? - Yes Neuropathy ? - No Hypoglycemic events - Yes  - Recovers with :   Pertinent ROS:  Polyuria - No Polydipsia - No Vision problems - No  Medications as noted below. Taking them regularly without complication/adverse reaction being reported today.  Patient fell getting out of the shower 10/06/22 Denise Mosley c/o hip left, hit her head - no blood NO syncope just stiff and sore. ACE for renal protection  History Denise Mosley has a past medical history of Bell's palsy, Diabetes mellitus without complication ([place]), Hyperlipidemia, and Hypertension.   Denise Mosley has a past surgical history that includes Cesarean section and Cholecystectomy.   Her family history includes Diabetes in her father and mother.Denise Mosley reports that Denise Mosley has quit smoking. Denise Mosley has never used smokeless tobacco. Denise Mosley reports that Denise Mosley does not drink alcohol and does not use drugs.  Current Outpatient Medications on File Prior to Visit  Medication Sig Dispense Refill  . albuterol (VENTOLIN HFA) 108 (90 Base) MCG/ACT inhaler Inhale 1-2 puffs into the lungs every 6 (six) hours as needed for wheezing or shortness of breath. 18 g 1  . atorvastatin (LIPITOR) 80 MG tablet TAKE 1 TABLET (80 MG TOTAL) BY MOUTH DAILY. 90 tablet 1  . Blood Glucose Monitoring Suppl (TRUE METRIX AIR GLUCOSE METER) w/Device KIT 1 Bag by Does not apply route 3 (three) times daily. 1 kit 0  . Blood Glucose Monitoring Suppl (TRUE METRIX METER) w/Device KIT Use as instructed 1 kit 0  . Dulaglutide (TRULICITY) 1.5  OE/4.2PN SOPN Inject 1.5 mg into the skin once a week. 2 mL 2  . empagliflozin (JARDIANCE) 10 MG TABS tablet Take 1 tablet (10 mg total) by mouth daily before breakfast. 90 tablet 1  . ezetimibe (ZETIA) 10 MG tablet TAKE 1 TABLET (10 MG TOTAL) BY MOUTH DAILY. 90 tablet 1  . fenofibrate 160 MG tablet Take 1 tablet (160 mg total) by mouth daily. 90 tablet 1  . fluticasone (FLONASE) 50 MCG/ACT nasal spray Place 2 sprays into both nostrils daily. 16 g 6  . Insulin Pen Needle (B-D UF III MINI PEN NEEDLES) 31G X 5 MM MISC Use as instructed 100 each 5  . lisinopril (ZESTRIL) 2.5 MG tablet Take 1 tablet (2.5 mg total) by mouth daily. 90 tablet 1  . loratadine (CLARITIN) 10 MG tablet Take 1 tablet (10 mg total) by mouth daily. 90 tablet 3  . metFORMIN (GLUCOPHAGE) 1000 MG tablet Take 1 tablet (1,000 mg total) by mouth 2 (two) times daily with a meal. 180 tablet 3  . TRUEplus Lancets 28G MISC Use as instructed to check blood sugar. 100 each 6   No current facility-administered medications on file prior to visit.    ROS Comprehensive ROS Pertinent positive and negative noted in HPI    Objective:  Blood Pressure 116/76   Pulse 69   Respiration 16   Oxygen Saturation 95%   BP Readings from Last 3 Encounters:  10/08/22 116/76  07/08/22 114/77  04/05/22 115/72    Wt  Readings from Last 3 Encounters:  07/08/22 176 lb 9.6 oz (80.1 kg)  04/05/22 174 lb 3.2 oz (79 kg)  12/31/21 172 lb 3.2 oz (78.1 kg)    Physical Exam Vitals reviewed.  Constitutional:      Appearance: Denise Mosley is obese.  HENT:     Head: Normocephalic.     Right Ear: Tympanic membrane and external ear normal.     Left Ear: Tympanic membrane and external ear normal.     Nose: Nose normal.  Eyes:     Extraocular Movements: Extraocular movements intact.     Pupils: Pupils are equal, round, and reactive to light.  Neck:     Comments: fall Cardiovascular:     Rate and Rhythm: Normal rate and regular rhythm.  Pulmonary:      Effort: Pulmonary effort is normal.     Breath sounds: Normal breath sounds.  Abdominal:     General: Bowel sounds are normal. There is distension.     Palpations: Abdomen is soft.     Hernia: No hernia is present.  Musculoskeletal:        General: Signs of injury present.     Cervical back: Rigidity present.     Comments: Left hip pain s/p fall  Skin:    General: Skin is warm and dry.  Neurological:     Mental Status: Denise Mosley is oriented to person, place, and time.  Psychiatric:        Mood and Affect: Mood normal.        Behavior: Behavior normal.   Lab Results  Component Value Date   HGBA1C 8.2 (A) 10/08/2022   HGBA1C 7.7 (A) 07/08/2022   HGBA1C 9.5 (A) 04/05/2022    Lab Results  Component Value Date   WBC 6.7 04/05/2022   HGB 14.9 04/05/2022   HCT 44.6 04/05/2022   PLT 222 04/05/2022   GLUCOSE 173 (H) 04/05/2022   CHOL 131 04/05/2022   TRIG 226 (H) 04/05/2022   HDL 40 04/05/2022   LDLCALC 55 04/05/2022   ALT 21 04/05/2022   AST 23 04/05/2022   NA 141 04/05/2022   K 4.5 04/05/2022   CL 103 04/05/2022   CREATININE 0.65 04/05/2022   BUN 14 04/05/2022   CO2 23 04/05/2022   TSH 2.610 08/14/2017   INR 0.98 01/14/2017   HGBA1C 8.2 (A) 10/08/2022     Assessment & Plan:   Denise Mosley was seen today for diabetes and fall.  Diagnoses and all orders for this visit:  Type 2 diabetes mellitus without complication, with long-term current use of insulin (HCC) -     POCT glycosylated hemoglobin (Hb A1C)   I am having Denise Mosley maintain her True Metrix Meter, B-D UF III MINI PEN NEEDLES, True Metrix Air Glucose Meter, TRUEplus Lancets 28G, fluticasone, loratadine, ezetimibe, fenofibrate, atorvastatin, albuterol, metFORMIN, lisinopril, empagliflozin, and Trulicity.  No orders of the defined types were placed in this encounter.    Follow-up:   Return in about 6 weeks (around 11/19/2022) for pap.  The above assessment and management plan was discussed with the patient.  The patient verbalized understanding of and has agreed to the management plan. Patient is aware to call the clinic if symptoms fail to improve or worsen. Patient is aware when to return to the clinic for a follow-up visit. Patient educated on when it is appropriate to go to the emergency department.   Juluis Mire, NP-C

## 2022-10-09 LAB — CBC WITH DIFFERENTIAL/PLATELET
Basophils Absolute: 0 10*3/uL (ref 0.0–0.2)
Basos: 0 %
EOS (ABSOLUTE): 0.4 10*3/uL (ref 0.0–0.4)
Eos: 5 %
Hematocrit: 45.3 % (ref 34.0–46.6)
Hemoglobin: 15.1 g/dL (ref 11.1–15.9)
Immature Grans (Abs): 0 10*3/uL (ref 0.0–0.1)
Immature Granulocytes: 0 %
Lymphocytes Absolute: 3 10*3/uL (ref 0.7–3.1)
Lymphs: 41 %
MCH: 28.9 pg (ref 26.6–33.0)
MCHC: 33.3 g/dL (ref 31.5–35.7)
MCV: 87 fL (ref 79–97)
Monocytes Absolute: 0.5 10*3/uL (ref 0.1–0.9)
Monocytes: 7 %
Neutrophils Absolute: 3.4 10*3/uL (ref 1.4–7.0)
Neutrophils: 47 %
Platelets: 226 10*3/uL (ref 150–450)
RBC: 5.23 x10E6/uL (ref 3.77–5.28)
RDW: 14 % (ref 11.7–15.4)
WBC: 7.4 10*3/uL (ref 3.4–10.8)

## 2022-10-09 LAB — CMP14+EGFR
ALT: 24 IU/L (ref 0–32)
AST: 20 IU/L (ref 0–40)
Albumin/Globulin Ratio: 1.4 (ref 1.2–2.2)
Albumin: 4.4 g/dL (ref 3.8–4.9)
Alkaline Phosphatase: 191 IU/L — ABNORMAL HIGH (ref 44–121)
BUN/Creatinine Ratio: 31 — ABNORMAL HIGH (ref 9–23)
BUN: 16 mg/dL (ref 6–24)
Bilirubin Total: 0.3 mg/dL (ref 0.0–1.2)
CO2: 18 mmol/L — ABNORMAL LOW (ref 20–29)
Calcium: 9.5 mg/dL (ref 8.7–10.2)
Chloride: 100 mmol/L (ref 96–106)
Creatinine, Ser: 0.52 mg/dL — ABNORMAL LOW (ref 0.57–1.00)
Globulin, Total: 3.2 g/dL (ref 1.5–4.5)
Glucose: 203 mg/dL — ABNORMAL HIGH (ref 70–99)
Potassium: 4.3 mmol/L (ref 3.5–5.2)
Sodium: 138 mmol/L (ref 134–144)
Total Protein: 7.6 g/dL (ref 6.0–8.5)
eGFR: 107 mL/min/{1.73_m2} (ref >59)

## 2022-10-09 LAB — LIPID PANEL
Chol/HDL Ratio: 7.8 ratio — ABNORMAL HIGH (ref 0.0–4.4)
Cholesterol, Total: 249 mg/dL — ABNORMAL HIGH (ref 100–199)
HDL: 32 mg/dL — ABNORMAL LOW (ref >39)
LDL Chol Calc (NIH): 91 mg/dL (ref 0–99)
Triglycerides: 758 mg/dL (ref 0–149)
VLDL Cholesterol Cal: 126 mg/dL — ABNORMAL HIGH (ref 5–40)

## 2022-10-10 ENCOUNTER — Telehealth (INDEPENDENT_AMBULATORY_CARE_PROVIDER_SITE_OTHER): Payer: Self-pay

## 2022-10-10 MED ORDER — EZETIMIBE 10 MG PO TABS
ORAL_TABLET | Freq: Every day | ORAL | 1 refills | Status: DC
  Filled 2022-10-10: qty 90, 90d supply, fill #0
  Filled 2023-01-03: qty 90, 90d supply, fill #1

## 2022-10-10 MED ORDER — ATORVASTATIN CALCIUM 80 MG PO TABS
ORAL_TABLET | Freq: Every day | ORAL | 1 refills | Status: DC
  Filled 2022-10-10: qty 90, 90d supply, fill #0
  Filled 2023-01-03: qty 90, 90d supply, fill #1

## 2022-10-10 MED ORDER — FENOFIBRATE 160 MG PO TABS
160.0000 mg | ORAL_TABLET | Freq: Every day | ORAL | 1 refills | Status: DC
  Filled 2022-10-10: qty 90, 90d supply, fill #0

## 2022-10-10 NOTE — Telephone Encounter (Signed)
[place] interpreters Denise Mosley  Id# [id number]  contacted pt to go over lab results pt is aware and doesn't have any questions or concerns

## 2022-10-11 ENCOUNTER — Other Ambulatory Visit: Payer: Self-pay

## 2022-10-17 ENCOUNTER — Other Ambulatory Visit: Payer: Self-pay

## 2022-11-18 ENCOUNTER — Other Ambulatory Visit: Payer: Self-pay

## 2022-12-16 ENCOUNTER — Other Ambulatory Visit: Payer: Self-pay

## 2022-12-20 ENCOUNTER — Other Ambulatory Visit: Payer: Self-pay

## 2022-12-23 ENCOUNTER — Other Ambulatory Visit: Payer: Self-pay

## 2023-01-03 ENCOUNTER — Other Ambulatory Visit: Payer: Self-pay

## 2023-01-08 ENCOUNTER — Encounter (INDEPENDENT_AMBULATORY_CARE_PROVIDER_SITE_OTHER): Payer: Self-pay | Admitting: Primary Care

## 2023-01-08 ENCOUNTER — Other Ambulatory Visit: Payer: Self-pay

## 2023-01-08 ENCOUNTER — Ambulatory Visit (INDEPENDENT_AMBULATORY_CARE_PROVIDER_SITE_OTHER): Payer: Self-pay | Admitting: Primary Care

## 2023-01-08 VITALS — BP 120/78 | HR 73 | Resp 16 | Ht <= 58 in | Wt 171.0 lb

## 2023-01-08 DIAGNOSIS — E1169 Type 2 diabetes mellitus with other specified complication: Secondary | ICD-10-CM

## 2023-01-08 DIAGNOSIS — R0981 Nasal congestion: Secondary | ICD-10-CM

## 2023-01-08 DIAGNOSIS — J302 Other seasonal allergic rhinitis: Secondary | ICD-10-CM

## 2023-01-08 DIAGNOSIS — E785 Hyperlipidemia, unspecified: Secondary | ICD-10-CM

## 2023-01-08 DIAGNOSIS — E119 Type 2 diabetes mellitus without complications: Secondary | ICD-10-CM

## 2023-01-08 DIAGNOSIS — I10 Essential (primary) hypertension: Secondary | ICD-10-CM

## 2023-01-08 DIAGNOSIS — Z760 Encounter for issue of repeat prescription: Secondary | ICD-10-CM

## 2023-01-08 DIAGNOSIS — Z794 Long term (current) use of insulin: Secondary | ICD-10-CM

## 2023-01-08 LAB — POCT GLYCOSYLATED HEMOGLOBIN (HGB A1C): HbA1c, POC (controlled diabetic range): 8.3 % — AB (ref 0.0–7.0)

## 2023-01-08 MED ORDER — TRULICITY 1.5 MG/0.5ML ~~LOC~~ SOAJ
1.5000 mg | SUBCUTANEOUS | 2 refills | Status: DC
Start: 2023-01-08 — End: 2023-05-01
  Filled 2023-01-08 – 2023-01-20 (×2): qty 2, 28d supply, fill #0
  Filled 2023-02-25: qty 2, 28d supply, fill #1
  Filled 2023-03-24: qty 2, 28d supply, fill #2

## 2023-01-08 MED ORDER — TRUEPLUS LANCETS 28G MISC
6 refills | Status: AC
Start: 2023-01-08 — End: ?
  Filled 2023-01-08: qty 100, 50d supply, fill #0
  Filled 2023-01-20: qty 100, 25d supply, fill #0
  Filled 2023-02-25: qty 100, 25d supply, fill #1

## 2023-01-08 MED ORDER — EMPAGLIFLOZIN 10 MG PO TABS
10.0000 mg | ORAL_TABLET | Freq: Every day | ORAL | 1 refills | Status: DC
Start: 2023-01-08 — End: 2023-07-30
  Filled 2023-01-08 – 2023-01-20 (×2): qty 30, 30d supply, fill #0

## 2023-01-08 MED ORDER — METFORMIN HCL 1000 MG PO TABS
1000.0000 mg | ORAL_TABLET | Freq: Two times a day (BID) | ORAL | 3 refills | Status: DC
Start: 2023-01-08 — End: 2023-07-30
  Filled 2023-01-08 – 2023-02-25 (×2): qty 180, 90d supply, fill #0
  Filled 2023-06-03: qty 180, 90d supply, fill #1

## 2023-01-08 MED ORDER — FLUTICASONE PROPIONATE 50 MCG/ACT NA SUSP
2.0000 | Freq: Every day | NASAL | 6 refills | Status: DC
Start: 2023-01-08 — End: 2024-01-30
  Filled 2023-01-08 – 2023-01-20 (×2): qty 16, 30d supply, fill #0
  Filled 2023-02-25: qty 16, 30d supply, fill #1

## 2023-01-08 MED ORDER — ALBUTEROL SULFATE HFA 108 (90 BASE) MCG/ACT IN AERS
1.0000 | INHALATION_SPRAY | Freq: Four times a day (QID) | RESPIRATORY_TRACT | 1 refills | Status: AC | PRN
Start: 2023-01-08 — End: ?
  Filled 2023-01-08: qty 6.7, 25d supply, fill #0
  Filled 2023-01-20: qty 6.7, 16d supply, fill #0

## 2023-01-08 MED ORDER — LISINOPRIL 2.5 MG PO TABS
2.5000 mg | ORAL_TABLET | Freq: Every day | ORAL | 1 refills | Status: DC
Start: 2023-01-08 — End: 2023-07-30
  Filled 2023-01-08 – 2023-02-25 (×2): qty 90, 90d supply, fill #0
  Filled 2023-05-01 (×2): qty 30, 30d supply, fill #0

## 2023-01-08 MED ORDER — BD PEN NEEDLE MINI U/F 31G X 5 MM MISC
5 refills | Status: AC
Start: 2023-01-08 — End: ?
  Filled 2023-01-08: qty 100, fill #0

## 2023-01-08 NOTE — Progress Notes (Signed)
[facility]  Denise Mosley, is a 62 y.o. female  ZOX:[id number]  WJX:[id number]  DOB - [birth year]  Chief Complaint  Patient presents with   Diabetes       Subjective:   Denise Mosley is a 61 y.o. female here today for a follow up visit. Patient has No headache, No chest pain, No abdominal pain - No Nausea, No new weakness tingling or numbness, No Cough - shortness of breath. Denies polyuria, polyphagia , polydipsia or vision changes   No problems updated.  No Known Allergies  Past Medical History:  Diagnosis Date   Bell's palsy    Diabetes mellitus without complication    Hyperlipidemia    Hypertension     Current Outpatient Medications on File Prior to Visit  Medication Sig Dispense Refill   albuterol (VENTOLIN HFA) 108 (90 Base) MCG/ACT inhaler Inhale 1-2 puffs into the lungs every 6 (six) hours as needed for wheezing or shortness of breath. 18 g 1   atorvastatin (LIPITOR) 80 MG tablet TAKE 1 TABLET (80 MG TOTAL) BY MOUTH DAILY. 90 tablet 1   Blood Glucose Monitoring Suppl (TRUE METRIX AIR GLUCOSE METER) w/Device KIT 1 Bag by Does not apply route 3 (three) times daily. 1 kit 0   Blood Glucose Monitoring Suppl (TRUE METRIX METER) w/Device KIT Use as instructed 1 kit 0   Dulaglutide (TRULICITY) 1.5 MG/0.5ML SOPN Inject 1.5 mg into the skin once a week. 2 mL 2   empagliflozin (JARDIANCE) 10 MG TABS tablet Take 1 tablet (10 mg total) by mouth daily before breakfast. 90 tablet 1   ezetimibe (ZETIA) 10 MG tablet TAKE 1 TABLET (10 MG TOTAL) BY MOUTH DAILY. 90 tablet 1   fenofibrate 160 MG tablet Take 1 tablet (160 mg total) by mouth daily. 90 tablet 1   fluticasone (FLONASE) 50 MCG/ACT nasal spray Place 2 sprays into both nostrils daily. 16 g 6   Insulin Pen Needle (B-D UF III MINI PEN NEEDLES) 31G X 5 MM MISC Use as instructed 100 each 5   lisinopril (ZESTRIL) 2.5 MG tablet Take 1 tablet (2.5 mg total) by mouth daily. 90 tablet 1   loratadine (CLARITIN)  10 MG tablet Take 1 tablet (10 mg total) by mouth daily. 90 tablet 3   metFORMIN (GLUCOPHAGE) 1000 MG tablet Take 1 tablet (1,000 mg total) by mouth 2 (two) times daily with a meal. 180 tablet 3   TRUEplus Lancets 28G MISC Use as instructed to check blood sugar. 100 each 6   No current facility-administered medications on file prior to visit.    Objective:   Vitals:   01/08/23 0906  BP: 120/78  Pulse: 73  Resp: 16  SpO2: 97%  Weight: 171 lb (77.6 kg)  Height: 4' 9.5" (1.461 m)    Comprehensive ROS Pertinent positive and negative noted in HPI   Exam General appearance : Awake, alert, not in any distress. Speech Clear. Not toxic looking HEENT: Atraumatic and Normocephalic, pupils equally reactive to light and accomodation Neck: Supple, no JVD. No cervical lymphadenopathy.  Chest: Good air entry bilaterally, no added sounds  CVS: S1 S2 regular, no murmurs.  Abdomen: Bowel sounds present, Non tender and not distended with no gaurding, rigidity or rebound. Extremities: B/L Lower Ext shows no edema, both legs are warm to touch Neurology: Awake alert, and oriented X 3, CN II-XII intact, Non focal Skin: No Rash  Data Review Lab Results  Component Value Date   HGBA1C 8.3 (A) 01/08/2023   HGBA1C  8.2 (A) 10/08/2022   HGBA1C 7.7 (A) 07/08/2022    Assessment & Plan  Denise Mosley was seen today for diabetes.  Diagnoses and all orders for this visit:  Type 2 diabetes mellitus without complication, with long-term current use of insulin (HCC) -     POCT glycosylated hemoglobin (Hb A1C) -     CBC with Differential/Platelet -     Dulaglutide (TRULICITY) 1.5 MG/0.5ML SOPN; Inject 1.5 mg into the skin once a week. -     Insulin Pen Needle (B-D UF III MINI PEN NEEDLES) 31G X 5 MM MISC; Use as instructed -     lisinopril (ZESTRIL) 2.5 MG tablet; Take 1 tablet (2.5 mg total) by mouth daily. -     metFORMIN (GLUCOPHAGE) 1000 MG tablet; Take 1 tablet (1,000 mg total) by mouth 2 (two) times daily with  a meal.  Hyperlipidemia associated with type 2 diabetes mellitus (HCC) -     Lipid panel  Nasal congestion 2/2 Seasonal allergies -     fluticasone (FLONASE) 50 MCG/ACT nasal spray; Place 2 sprays into both nostrils daily.  Essential hypertension BP well controlled  BP goal - < 130/80 Explained that having normal blood pressure is the goal and medications are helping to get to goal and maintain normal blood pressure. DIET: Limit salt intake, read nutrition labels to check salt content, limit fried and high fatty foods  Avoid using multisymptom OTC cold preparations that generally contain sudafed which can rise BP. Consult with pharmacist on best cold relief products to use for persons with HTN EXERCISE Discussed incorporating exercise such as walking - 30 minutes most days of the week and can do in 10 minute intervals    -     Comprehensive metabolic panel -     lisinopril (ZESTRIL) 2.5 MG tablet; Take 1 tablet (2.5 mg total) by mouth daily.  Medication refill -     albuterol (VENTOLIN HFA) 108 (90 Base) MCG/ACT inhaler; Inhale 1-2 puffs into the lungs every 6 (six) hours as needed for wheezing or shortness of breath. -     Dulaglutide (TRULICITY) 1.5 MG/0.5ML SOPN; Inject 1.5 mg into the skin once a week. -     empagliflozin (JARDIANCE) 10 MG TABS tablet; Take 1 tablet (10 mg total) by mouth daily before breakfast. -     fluticasone (FLONASE) 50 MCG/ACT nasal spray; Place 2 sprays into both nostrils daily. -     Insulin Pen Needle (B-D UF III MINI PEN NEEDLES) 31G X 5 MM MISC; Use as instructed -     lisinopril (ZESTRIL) 2.5 MG tablet; Take 1 tablet (2.5 mg total) by mouth daily. -     metFORMIN (GLUCOPHAGE) 1000 MG tablet; Take 1 tablet (1,000 mg total) by mouth 2 (two) times daily with a meal. -     TRUEplus Lancets 28G MISC; Use as instructed to check blood sugar.    Patient have been counseled extensively about nutrition and exercise. Other issues discussed during this visit  include: low cholesterol diet, weight control and daily exercise, foot care, annual eye examinations at Ophthalmology, importance of adherence with medications and regular follow-up. We also discussed long term complications of uncontrolled diabetes and hypertension.   Return in about 6 weeks (around 02/19/2023) for pap.  The patient was given clear instructions to go to ER or return to medical center if symptoms don't improve, worsen or new problems develop. The patient verbalized understanding. The patient was told to call to get lab results if  they haven't heard anything in the next week.   This note has been created with Education officer, environmental. Any transcriptional errors are unintentional.   Grayce Sessions, NP 01/08/2023, 9:41 AM

## 2023-01-08 NOTE — Patient Instructions (Signed)
Rinitis alrgica en adultos Allergic Rhinitis, Adult  La rinitis alrgica es una reaccin a alrgenos. Los alrgenos son cosas que pueden causar Runner, broadcasting/film/video. Esta afeccin afecta el revestimiento del interior de la nariz (membrana mucosa). [employer] tipos de rinitis alrgica: Astronomer. Este tipo tambin se denomina fiebre del heno. Sucede nicamente durante algunas pocas del ao. [place]. Este tipo puede ocurrir en cualquier momento del ao. Esta afeccin no puede transmitirse de [place] persona a otra (no es contagiosa). Puede ser leve, grave o muy grave. Puede aparecer a cualquier edad y se puede superar con los [place]. Cules son las causas? El polen que proviene de los rboles, el pasto y las [place]. Otras causas pueden ser: caros del polvo. Humo. Moho. Humo de vehculos. El pis (orina), la saliva o la caspa de [place]. La caspa son las clulas muertas de la piel de [place]. Qu incrementa el riesgo? Es ms probable que sufra esta afeccin si: Energy manager en su familia. Tiene problemas como alergias en su familia. Es posible que tenga: Hinchazn en parte de los ojos y los prpados. Asma. Esta afecta la respiracin. Enrojecimiento e hinchazn a Chartered certified accountant piel. Alergias a los alimentos. Cules son los signos o sntomas? El sntoma principal de esta afeccin es la secrecin nasal o el taponamiento nasal (congestin nasal). Otros sntomas pueden incluir: Tos o estornudos. Picazn y lagrimeo en los ojos. Mucosidad que gotea por la parte posterior de la garganta (goteo posnasal). Esto puede causar dolor de garganta. Dificultad para dormir. Cansancio. Dolor de [place]. Cmo se trata? No hay cura para esta afeccin. Debe evitar las cosas a las cuales es alrgico. El tratamiento puede ayudar a Paramedic los sntomas. Puede incluir: Medicamentos que [employer] sntomas de [place], como los antiinflamatorios o los antihistamnicos. Estos pueden administrarse en  forma de inyeccin, aerosol nasal o comprimidos. Evitar las cosas a las cuales es alrgico. Medicamentos que le proporcionan un poco de aquello a lo que es alrgico con Museum/gallery conservator. Esto se denomina inmunoterapia. Se realiza si otros tratamientos no son eficaces. Pueden darle lo siguiente: Inyecciones. Medicamentos debajo de Scientist, product/process development. Medicamentos ms potentes, si otros tratamientos no son eficaces. Siga estas instrucciones en su casa: Evite los alrgenos [employer] cosas a las que es alrgico y Chief of Staff. Para hacer esto, pruebe lo siguiente: Si tiene Scientist, research (physical sciences) del ao: Reemplace las alfombras por pisos de [place], baldosas o vinilo. Las alfombras pueden retener la caspa de las mascotas y [place]. No fume. No permita que fumen en su casa. Cambie los filtros de la calefaccin y del aire acondicionado al menos una vez al mes. Si tiene Advertising account executive en algunos momentos del ao: Mantenga las ventanas cerradas cuando pueda. Planee hacer las cosas al aire libre cuando las concentraciones de polen estn [place]. Fjese en las concentraciones de polen antes de planificar cosas para hacer al [employer]. Cuando vuelva al interior, cmbiese de ropa y dchese antes de sentarse en los muebles o en la cama. Si es alrgico a Teaching laboratory technician: Mantenga a la mascota fuera de su dormitorio. Pase la aspiradora, barra y limpie el polvo con frecuencia. Instrucciones generales Use los medicamentos de venta libre y los recetados solamente como se lo haya indicado el mdico. Beba suficiente lquido como para Pharmacologist la orina de color amarillo plido. Dnde buscar ms informacin American Academy of Allergy, Asthma & Immunology (Academia Estadounidense de [place], [place] e Inmunologa): 7076418598.org Comunquese con un mdico si: Tiene fiebre.  Tiene tos que no desaparece. Emite sonidos de silbidos agudos al respirar, ms a menudo al exhalar (sibilancias). Los sntomas lo enlentecen. Los  Scientist, research (life sciences) las cosas habituales todos [place]. Solicite ayuda de inmediato si: Le falta el aire. Este sntoma puede Customer service manager. Solicite ayuda de inmediato. Llame al 911. No espere a ver si el sntoma desaparece. No conduzca por sus propios medios [employer]. Esta informacin no tiene Theme park manager el consejo del mdico. Asegrese de hacerle al mdico cualquier pregunta que tenga. Document Revised: 06/13/2022 Document Reviewed: 06/13/2022 Elsevier Patient Education  2023 [employer].

## 2023-01-09 LAB — COMPREHENSIVE METABOLIC PANEL
ALT: 21 IU/L (ref 0–32)
AST: 17 IU/L (ref 0–40)
Albumin/Globulin Ratio: 1.6 (ref 1.2–2.2)
Albumin: 4.5 g/dL (ref 3.8–4.9)
Alkaline Phosphatase: 202 IU/L — ABNORMAL HIGH (ref 44–121)
BUN/Creatinine Ratio: 19 (ref 9–23)
BUN: 9 mg/dL (ref 6–24)
Bilirubin Total: 0.4 mg/dL (ref 0.0–1.2)
CO2: 26 mmol/L (ref 20–29)
Calcium: 10 mg/dL (ref 8.7–10.2)
Chloride: 99 mmol/L (ref 96–106)
Creatinine, Ser: 0.47 mg/dL — ABNORMAL LOW (ref 0.57–1.00)
Globulin, Total: 2.8 g/dL (ref 1.5–4.5)
Glucose: 162 mg/dL — ABNORMAL HIGH (ref 70–99)
Potassium: 4.7 mmol/L (ref 3.5–5.2)
Sodium: 140 mmol/L (ref 134–144)
Total Protein: 7.3 g/dL (ref 6.0–8.5)
eGFR: 110 mL/min/{1.73_m2} (ref >59)

## 2023-01-09 LAB — CBC WITH DIFFERENTIAL/PLATELET
Basophils Absolute: 0 10*3/uL (ref 0.0–0.2)
Basos: 0 %
EOS (ABSOLUTE): 0.4 10*3/uL (ref 0.0–0.4)
Eos: 4 %
Hematocrit: 45.4 % (ref 34.0–46.6)
Hemoglobin: 14.9 g/dL (ref 11.1–15.9)
Immature Grans (Abs): 0 10*3/uL (ref 0.0–0.1)
Immature Granulocytes: 0 %
Lymphocytes Absolute: 2.8 10*3/uL (ref 0.7–3.1)
Lymphs: 34 %
MCH: 28.7 pg (ref 26.6–33.0)
MCHC: 32.8 g/dL (ref 31.5–35.7)
MCV: 88 fL (ref 79–97)
Monocytes Absolute: 0.5 10*3/uL (ref 0.1–0.9)
Monocytes: 6 %
Neutrophils Absolute: 4.4 10*3/uL (ref 1.4–7.0)
Neutrophils: 56 %
Platelets: 228 10*3/uL (ref 150–450)
RBC: 5.19 x10E6/uL (ref 3.77–5.28)
RDW: 13.7 % (ref 11.7–15.4)
WBC: 8 10*3/uL (ref 3.4–10.8)

## 2023-01-09 LAB — LIPID PANEL
Chol/HDL Ratio: 2.8 ratio (ref 0.0–4.4)
Cholesterol, Total: 102 mg/dL (ref 100–199)
HDL: 36 mg/dL — ABNORMAL LOW (ref >39)
LDL Chol Calc (NIH): 24 mg/dL (ref 0–99)
Triglycerides: 281 mg/dL — ABNORMAL HIGH (ref 0–149)
VLDL Cholesterol Cal: 42 mg/dL — ABNORMAL HIGH (ref 5–40)

## 2023-01-13 ENCOUNTER — Other Ambulatory Visit: Payer: Self-pay

## 2023-01-13 ENCOUNTER — Other Ambulatory Visit (INDEPENDENT_AMBULATORY_CARE_PROVIDER_SITE_OTHER): Payer: Self-pay | Admitting: Primary Care

## 2023-01-13 DIAGNOSIS — Z760 Encounter for issue of repeat prescription: Secondary | ICD-10-CM

## 2023-01-13 DIAGNOSIS — E781 Pure hyperglyceridemia: Secondary | ICD-10-CM

## 2023-01-13 DIAGNOSIS — E7841 Elevated Lipoprotein(a): Secondary | ICD-10-CM

## 2023-01-13 MED ORDER — ATORVASTATIN CALCIUM 80 MG PO TABS
80.0000 mg | ORAL_TABLET | Freq: Every day | ORAL | 1 refills | Status: DC
Start: 2023-01-13 — End: 2023-10-20
  Filled 2023-01-13: qty 90, fill #0
  Filled 2023-03-24: qty 30, 30d supply, fill #0
  Filled 2023-05-01 (×2): qty 30, 30d supply, fill #1
  Filled 2023-06-03: qty 30, 30d supply, fill #2
  Filled 2023-07-01: qty 30, 30d supply, fill #3
  Filled 2023-08-12: qty 30, 30d supply, fill #4
  Filled 2023-09-15: qty 30, 30d supply, fill #5

## 2023-01-13 MED ORDER — FENOFIBRATE 160 MG PO TABS
160.0000 mg | ORAL_TABLET | Freq: Every day | ORAL | 1 refills | Status: DC
Start: 2023-01-13 — End: 2023-08-12
  Filled 2023-01-13: qty 90, 90d supply, fill #0
  Filled 2023-01-20: qty 30, 30d supply, fill #0
  Filled 2023-02-25: qty 30, 30d supply, fill #1
  Filled 2023-03-24: qty 30, 30d supply, fill #2
  Filled 2023-05-01 (×2): qty 30, 30d supply, fill #3
  Filled 2023-06-03: qty 30, 30d supply, fill #4
  Filled 2023-07-11: qty 30, 30d supply, fill #5

## 2023-01-13 MED ORDER — EZETIMIBE 10 MG PO TABS
10.0000 mg | ORAL_TABLET | Freq: Every day | ORAL | 1 refills | Status: DC
Start: 2023-01-13 — End: 2023-10-20
  Filled 2023-01-13: qty 90, fill #0
  Filled 2023-03-24: qty 30, 30d supply, fill #0
  Filled 2023-05-01 (×2): qty 30, 30d supply, fill #1
  Filled 2023-06-03: qty 30, 30d supply, fill #2
  Filled 2023-07-11: qty 30, 30d supply, fill #3
  Filled 2023-08-12: qty 30, 30d supply, fill #4
  Filled 2023-09-15: qty 30, 30d supply, fill #5

## 2023-01-15 ENCOUNTER — Other Ambulatory Visit: Payer: Self-pay

## 2023-01-16 ENCOUNTER — Other Ambulatory Visit: Payer: Self-pay

## 2023-01-16 ENCOUNTER — Telehealth (INDEPENDENT_AMBULATORY_CARE_PROVIDER_SITE_OTHER): Payer: Self-pay

## 2023-01-16 NOTE — Telephone Encounter (Signed)
[place] interpreters Denise Mosley Id# [id number] contacted pt to go over lab results pt is aware and doesn't have any questions or concerns   

## 2023-01-20 ENCOUNTER — Other Ambulatory Visit: Payer: Self-pay

## 2023-01-20 ENCOUNTER — Other Ambulatory Visit (INDEPENDENT_AMBULATORY_CARE_PROVIDER_SITE_OTHER): Payer: Self-pay | Admitting: Primary Care

## 2023-01-20 ENCOUNTER — Other Ambulatory Visit (INDEPENDENT_AMBULATORY_CARE_PROVIDER_SITE_OTHER): Payer: Self-pay

## 2023-01-20 DIAGNOSIS — E119 Type 2 diabetes mellitus without complications: Secondary | ICD-10-CM

## 2023-01-20 DIAGNOSIS — IMO0002 Reserved for concepts with insufficient information to code with codable children: Secondary | ICD-10-CM

## 2023-01-20 MED ORDER — TRUE METRIX BLOOD GLUCOSE TEST VI STRP
ORAL_STRIP | 12 refills | Status: AC
Start: 2023-01-20 — End: 2024-01-20
  Filled 2023-01-20: qty 100, 25d supply, fill #0
  Filled 2023-02-25: qty 100, 25d supply, fill #1

## 2023-01-20 NOTE — Telephone Encounter (Signed)
Franky Macho could you order

## 2023-01-21 ENCOUNTER — Other Ambulatory Visit: Payer: Self-pay

## 2023-01-24 ENCOUNTER — Other Ambulatory Visit: Payer: Self-pay

## 2023-02-14 ENCOUNTER — Other Ambulatory Visit: Payer: Self-pay

## 2023-02-19 ENCOUNTER — Ambulatory Visit (INDEPENDENT_AMBULATORY_CARE_PROVIDER_SITE_OTHER): Payer: Self-pay | Admitting: Primary Care

## 2023-02-21 ENCOUNTER — Other Ambulatory Visit: Payer: Self-pay

## 2023-02-25 ENCOUNTER — Other Ambulatory Visit: Payer: Self-pay

## 2023-03-24 ENCOUNTER — Other Ambulatory Visit: Payer: Self-pay

## 2023-04-15 ENCOUNTER — Ambulatory Visit (INDEPENDENT_AMBULATORY_CARE_PROVIDER_SITE_OTHER): Payer: Self-pay | Admitting: Primary Care

## 2023-04-29 ENCOUNTER — Ambulatory Visit (INDEPENDENT_AMBULATORY_CARE_PROVIDER_SITE_OTHER): Payer: Self-pay | Admitting: Primary Care

## 2023-04-29 VITALS — BP 119/77 | HR 65 | Temp 97.9°F | Resp 16 | Wt 165.0 lb

## 2023-04-29 DIAGNOSIS — I10 Essential (primary) hypertension: Secondary | ICD-10-CM

## 2023-04-29 DIAGNOSIS — Z794 Long term (current) use of insulin: Secondary | ICD-10-CM

## 2023-04-29 DIAGNOSIS — Z760 Encounter for issue of repeat prescription: Secondary | ICD-10-CM

## 2023-04-29 DIAGNOSIS — E119 Type 2 diabetes mellitus without complications: Secondary | ICD-10-CM

## 2023-04-29 NOTE — Progress Notes (Signed)
Subjective:  Patient ID: Denise Mosley, female    DOB: [birth year]  Age: 62 y.o. MRN: [id number]  CC: Diabetes   HPI Denise Mosley presents forFollow-up of diabetes. Patient does not check blood sugar at home  Compliant with meds - Yes Checking CBGs? Yes  Fasting avg - 80-146  Postprandial average -  Exercising regularly? - Yes Watching carbohydrate intake? - Yes Neuropathy ? - No Hypoglycemic events - No  - Recovers with :   Pertinent ROS:  Polyuria - No Polydipsia - No Vision problems - No  Medications as noted below. Taking them regularly without complication/adverse reaction being reported today.   History Denise Mosley has a past medical history of Bell's palsy, Diabetes mellitus without complication (HCC), Hyperlipidemia, and Hypertension.   She has a past surgical history that includes Cesarean section and Cholecystectomy.   Her family history includes Diabetes in her father and mother.She reports that she has quit smoking. She has never used smokeless tobacco. She reports that she does not drink alcohol and does not use drugs.  Current Outpatient Medications on File Prior to Visit  Medication Sig Dispense Refill   albuterol (VENTOLIN HFA) 108 (90 Base) MCG/ACT inhaler Inhale 1-2 puffs into the lungs every 6 (six) hours as needed for wheezing or shortness of breath. 6.7 g 1   atorvastatin (LIPITOR) 80 MG tablet Take 1 tablet (80 mg total) by mouth daily. 90 tablet 1   Blood Glucose Monitoring Suppl (TRUE METRIX AIR GLUCOSE METER) w/Device KIT 1 Bag by Does not apply route 3 (three) times daily. 1 kit 0   Blood Glucose Monitoring Suppl (TRUE METRIX METER) w/Device KIT Use as instructed 1 kit 0   empagliflozin (JARDIANCE) 10 MG TABS tablet Take 1 tablet (10 mg total) by mouth daily before breakfast. 90 tablet 1   ezetimibe (ZETIA) 10 MG tablet Take 1 tablet (10 mg total) by mouth daily. 90 tablet 1   fenofibrate 160 MG tablet Take 1 tablet (160 mg total) by mouth daily. 90  tablet 1   fluticasone (FLONASE) 50 MCG/ACT nasal spray Place 2 sprays into both nostrils daily. 16 g 6   glucose blood (TRUE METRIX BLOOD GLUCOSE TEST) test strip USE AS INSTRUCTED 100 strip 12   Insulin Pen Needle (B-D UF III MINI PEN NEEDLES) 31G X 5 MM MISC Use as instructed 100 each 5   lisinopril (ZESTRIL) 2.5 MG tablet Take 1 tablet (2.5 mg total) by mouth daily. 90 tablet 1   loratadine (CLARITIN) 10 MG tablet Take 1 tablet (10 mg total) by mouth daily. 90 tablet 3   metFORMIN (GLUCOPHAGE) 1000 MG tablet Take 1 tablet (1,000 mg total) by mouth 2 (two) times daily with a meal. 180 tablet 3   TRUEplus Lancets 28G MISC Use as instructed to check blood sugar. 100 each 6   No current facility-administered medications on file prior to visit.    ROS Comprehensive ROS Pertinent positive and negative noted in HPI    Objective:  Blood Pressure 119/77 (BP Location: Left Arm, Patient Position: Sitting, Cuff Size: Normal)   Pulse 65   Temperature 97.9 F (36.6 C) (Oral)   Respiration 16   Weight 165 lb (74.8 kg)   Oxygen Saturation 99%   Body Mass Index 35.09 kg/m   BP Readings from Last 3 Encounters:  04/29/23 119/77  01/08/23 120/78  10/08/22 116/76    Wt Readings from Last 3 Encounters:  04/29/23 165 lb (74.8 kg)  01/08/23 171 lb (77.6 kg)  07/08/22 176 lb 9.6 oz (80.1 kg)    Physical Exam Vitals reviewed.  Constitutional:      Appearance: She is obese.  HENT:     Head: Normocephalic.     Right Ear: Tympanic membrane and external ear normal.     Left Ear: Tympanic membrane and external ear normal.     Nose: Nose normal.  Eyes:     Extraocular Movements: Extraocular movements intact.     Pupils: Pupils are equal, round, and reactive to light.  Cardiovascular:     Rate and Rhythm: Normal rate and regular rhythm.  Pulmonary:     Effort: Pulmonary effort is normal.     Breath sounds: Normal breath sounds.  Abdominal:     General: Bowel sounds are normal. There is  distension.     Palpations: Abdomen is soft.  Musculoskeletal:        General: Normal range of motion.     Cervical back: Normal range of motion and neck supple.  Skin:    General: Skin is warm and dry.  Neurological:     Mental Status: She is oriented to person, place, and time.  Psychiatric:        Mood and Affect: Mood normal.        Behavior: Behavior normal.    Lab Results  Component Value Date   HGBA1C 8.3 (A) 01/08/2023   HGBA1C 8.2 (A) 10/08/2022   HGBA1C 7.7 (A) 07/08/2022    Lab Results  Component Value Date   WBC 8.0 01/08/2023   HGB 14.9 01/08/2023   HCT 45.4 01/08/2023   PLT 228 01/08/2023   GLUCOSE 162 (H) 01/08/2023   CHOL 102 01/08/2023   TRIG 281 (H) 01/08/2023   HDL 36 (L) 01/08/2023   LDLCALC 24 01/08/2023   ALT 21 01/08/2023   AST 17 01/08/2023   NA 140 01/08/2023   K 4.7 01/08/2023   CL 99 01/08/2023   CREATININE 0.47 (L) 01/08/2023   BUN 9 01/08/2023   CO2 26 01/08/2023   TSH 2.610 08/14/2017   INR 0.98 01/14/2017   HGBA1C 8.3 (A) 01/08/2023     Assessment & Plan:  Denise Mosley was seen today for diabetes.  Diagnoses and all orders for this visit:  Type 2 diabetes mellitus without complication, with long-term current use of insulin (HCC) Last A1C 8.3 rtn for labs added same day - educated on lifestyle modifications, including but not limited to diet choices and adding exercise to daily routine.    Medication refill Trulicity, empagliflozin,  lisinopril, metFORMIN  Essential hypertension Well controlled on ACE renal protection     I am having Denise Mosley maintain her True Metrix Meter, True Metrix Air Glucose Meter, loratadine, albuterol, empagliflozin, fluticasone, B-D UF III MINI PEN NEEDLES, lisinopril, metFORMIN, TRUEplus Lancets 28G, atorvastatin, ezetimibe, fenofibrate, and True Metrix Blood Glucose Test.  No orders of the defined types were placed in this encounter.    Follow-up:   Return in about 3 months (around  07/30/2023) for fasting.  The above assessment and management plan was discussed with the patient. The patient verbalized understanding of and has agreed to the management plan. Patient is aware to call the clinic if symptoms fail to improve or worsen. Patient is aware when to return to the clinic for a follow-up visit. Patient educated on when it is appropriate to go to the emergency department.   Gwinda Passe, NP-C

## 2023-04-29 NOTE — Progress Notes (Signed)
F/U DM Medication RF

## 2023-04-30 ENCOUNTER — Encounter (INDEPENDENT_AMBULATORY_CARE_PROVIDER_SITE_OTHER): Payer: Self-pay

## 2023-04-30 ENCOUNTER — Ambulatory Visit (INDEPENDENT_AMBULATORY_CARE_PROVIDER_SITE_OTHER): Payer: Self-pay | Admitting: Primary Care

## 2023-05-01 ENCOUNTER — Other Ambulatory Visit: Payer: Self-pay

## 2023-05-01 ENCOUNTER — Other Ambulatory Visit (INDEPENDENT_AMBULATORY_CARE_PROVIDER_SITE_OTHER): Payer: Self-pay | Admitting: Primary Care

## 2023-05-01 DIAGNOSIS — Z760 Encounter for issue of repeat prescription: Secondary | ICD-10-CM

## 2023-05-01 DIAGNOSIS — E119 Type 2 diabetes mellitus without complications: Secondary | ICD-10-CM

## 2023-05-01 MED ORDER — TRULICITY 1.5 MG/0.5ML ~~LOC~~ SOAJ
1.5000 mg | SUBCUTANEOUS | 2 refills | Status: DC
Start: 2023-05-01 — End: 2023-07-30
  Filled 2023-05-01 – 2023-06-03 (×2): qty 2, 28d supply, fill #0
  Filled 2023-07-01: qty 2, 28d supply, fill #1

## 2023-05-06 ENCOUNTER — Other Ambulatory Visit: Payer: Self-pay

## 2023-05-06 MED ORDER — AMOXICILLIN 875 MG PO TABS
875.0000 mg | ORAL_TABLET | Freq: Two times a day (BID) | ORAL | 0 refills | Status: DC
  Filled 2023-05-06: qty 10, 5d supply, fill #0

## 2023-05-06 MED ORDER — IBUPROFEN 600 MG PO TABS
600.0000 mg | ORAL_TABLET | Freq: Four times a day (QID) | ORAL | 0 refills | Status: AC | PRN
  Filled 2023-05-06: qty 20, 5d supply, fill #0

## 2023-05-13 ENCOUNTER — Other Ambulatory Visit: Payer: Self-pay

## 2023-05-15 ENCOUNTER — Other Ambulatory Visit: Payer: Self-pay

## 2023-06-03 ENCOUNTER — Other Ambulatory Visit: Payer: Self-pay

## 2023-06-17 ENCOUNTER — Other Ambulatory Visit: Payer: Self-pay

## 2023-06-19 IMAGING — MG MM DIGITAL SCREENING BILAT W/ TOMO AND CAD
8 series · 8 of 24 positions shown · non-contrast
Comparison: Previous exam(s).

ACR Breast Density Category a: The breast tissue is almost entirely
fatty.

CLINICAL DATA: Screening.

EXAM:
DIGITAL SCREENING BILATERAL MAMMOGRAM WITH TOMOSYNTHESIS AND CAD
TECHNIQUE: Bilateral screening digital craniocaudal and mediolateral oblique
mammograms were obtained. Bilateral screening digital breast
tomosynthesis was performed. The images were evaluated with
computer-aided detection.

[L MLO synth-2D]
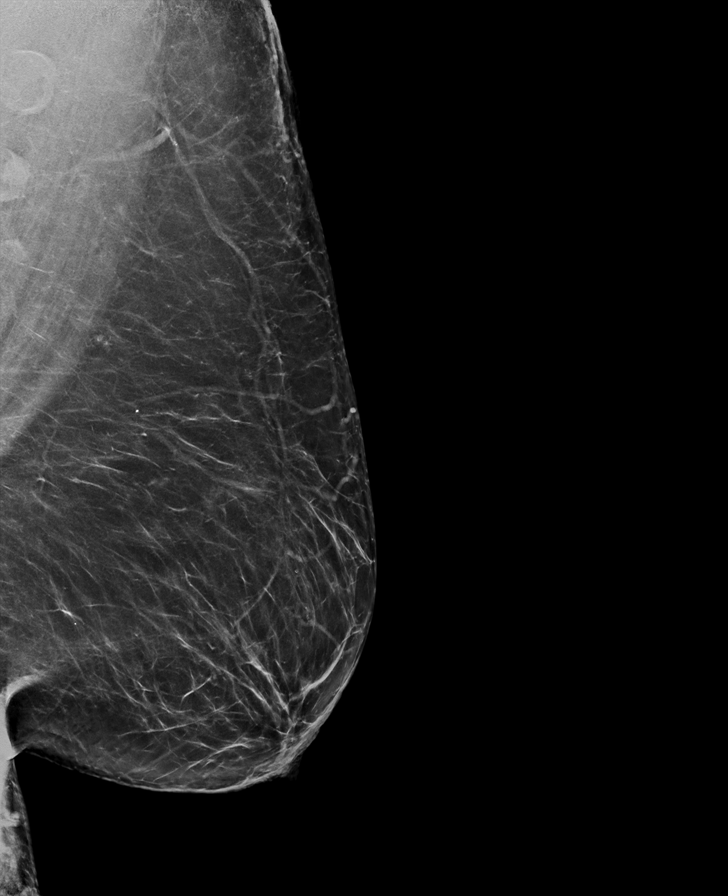

[L CC synth-2D]
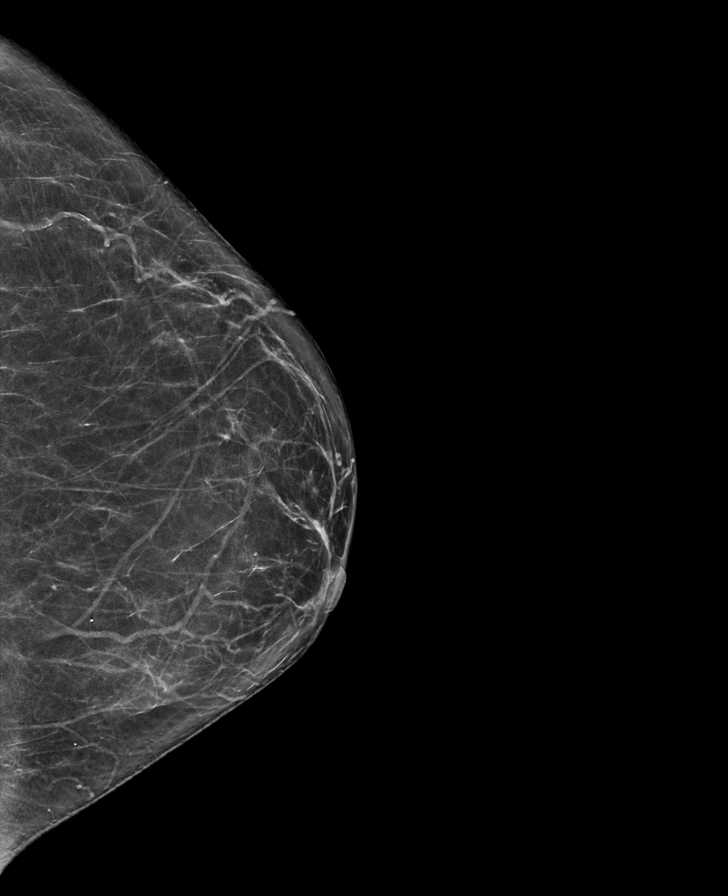

[R MLO synth-2D]
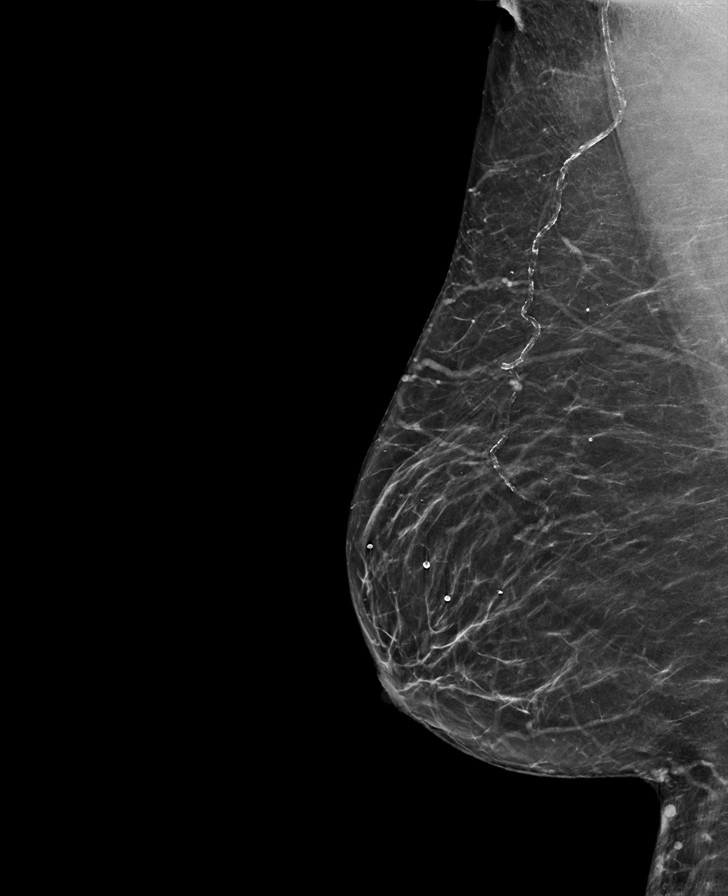

[R CC synth-2D]
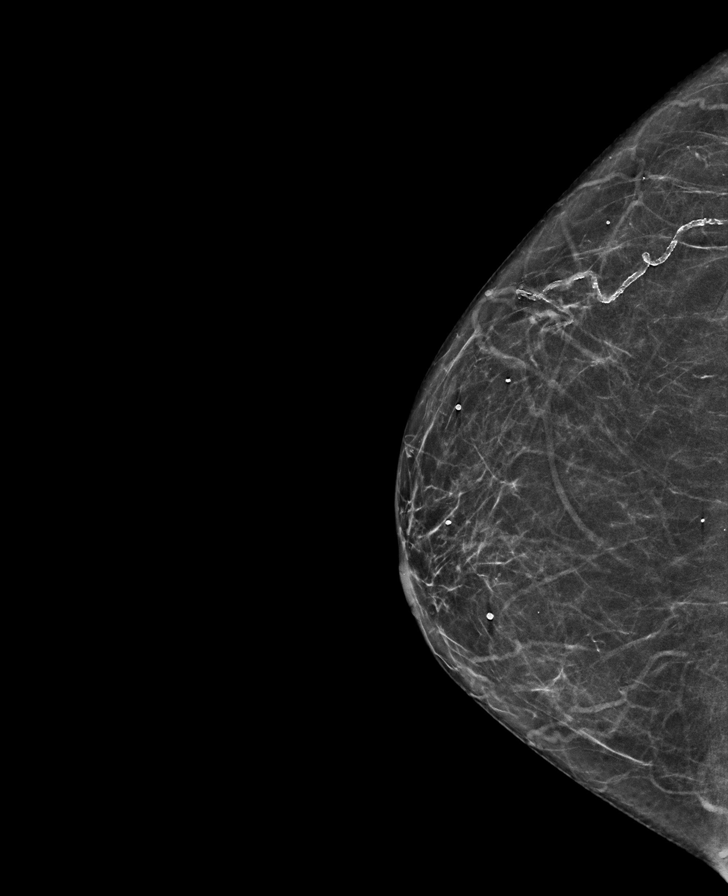

[L MLO tomo · tomo slice 39/78.0]
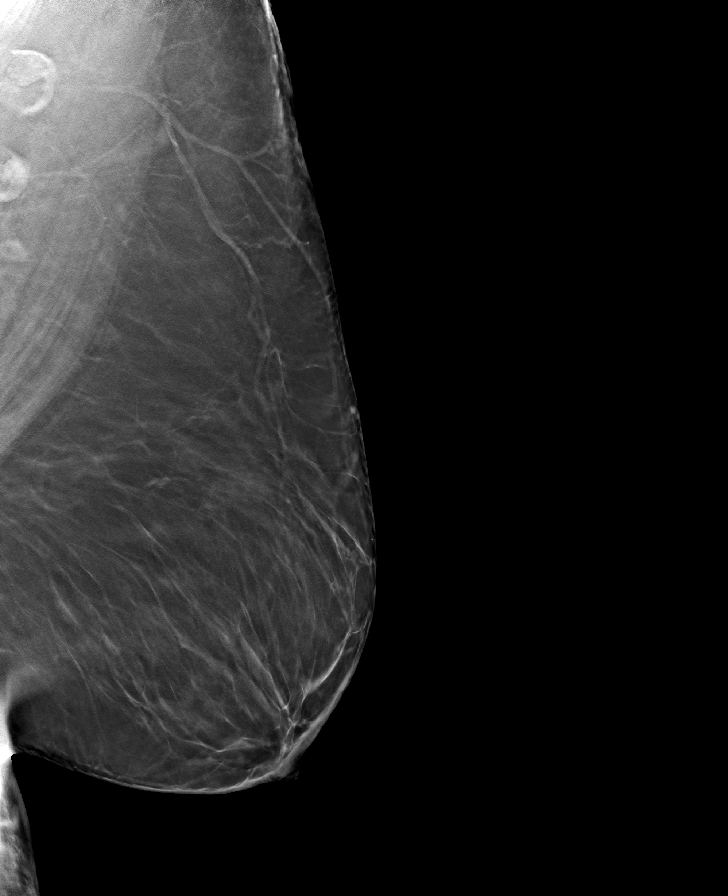

[L CC tomo · tomo slice 33/65.0]
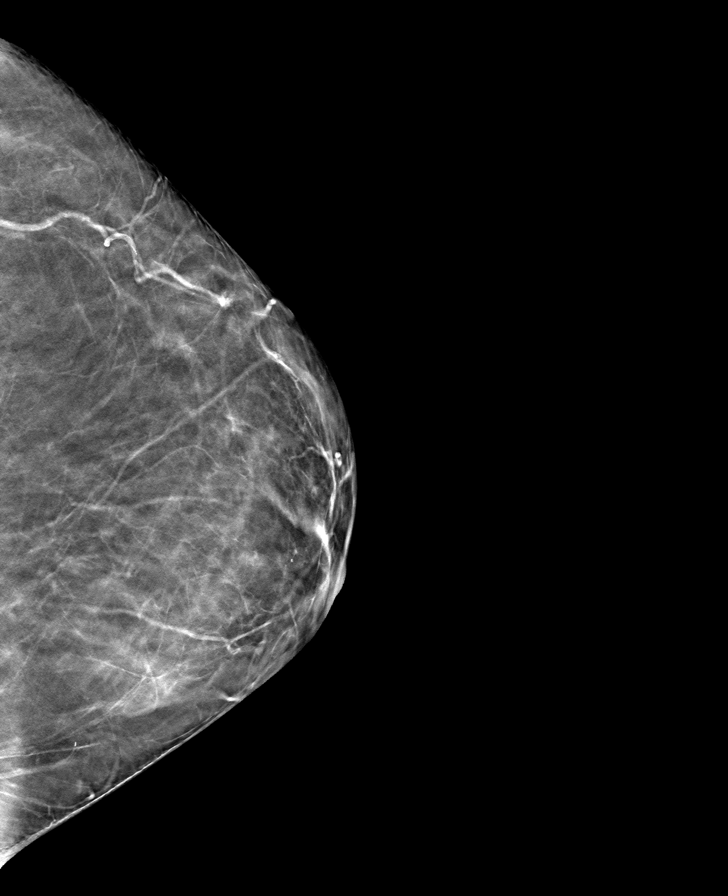

[R CC tomo · tomo slice 31/62.0]
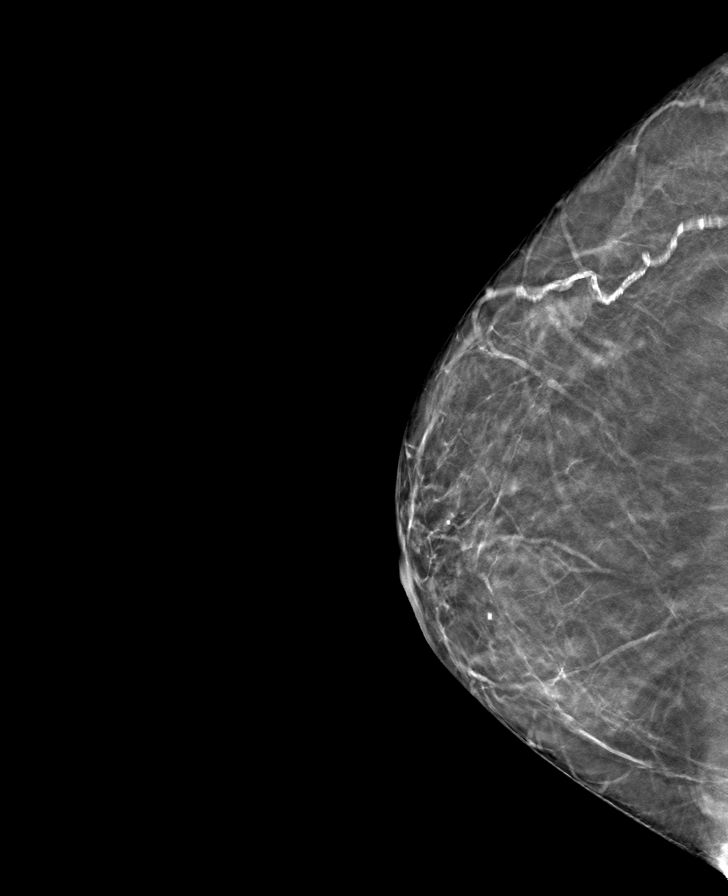

[R MLO tomo · tomo slice 36/71.0]
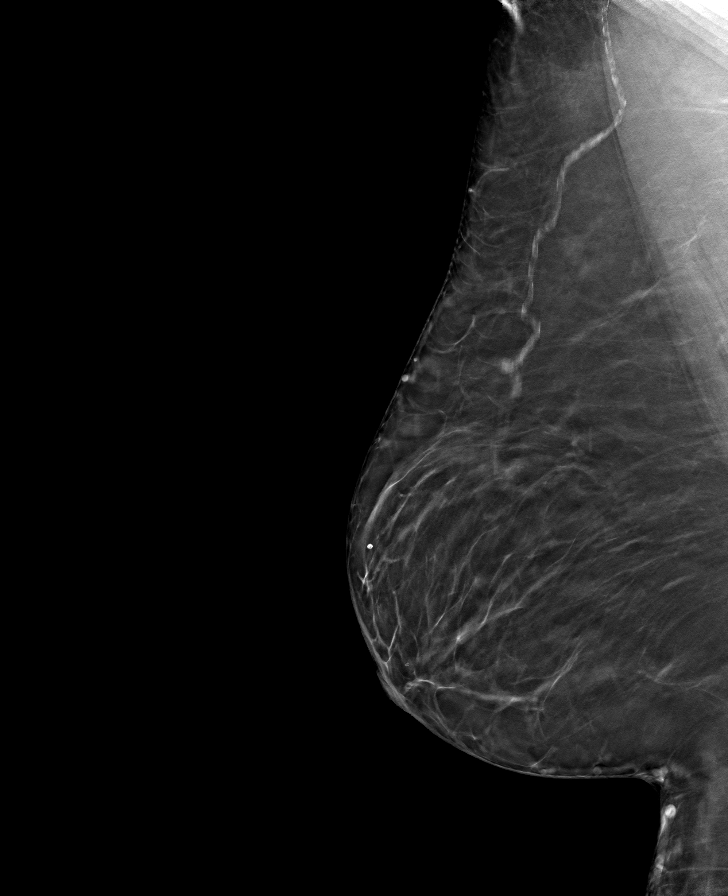

[8 of 24 positions shown; findings below may reference images not displayed]

FINDINGS: There are no findings suspicious for malignancy.
IMPRESSION: No mammographic evidence of malignancy. A result letter of this
screening mammogram will be mailed directly to the patient.

RECOMMENDATION:
Screening mammogram in one year. (Code:0E-3-N98)

BI-RADS CATEGORY  1: Negative.

## 2023-07-01 ENCOUNTER — Other Ambulatory Visit: Payer: Self-pay

## 2023-07-11 ENCOUNTER — Other Ambulatory Visit: Payer: Self-pay

## 2023-07-30 ENCOUNTER — Encounter (INDEPENDENT_AMBULATORY_CARE_PROVIDER_SITE_OTHER): Payer: Self-pay | Admitting: Primary Care

## 2023-07-30 ENCOUNTER — Ambulatory Visit (INDEPENDENT_AMBULATORY_CARE_PROVIDER_SITE_OTHER): Payer: Self-pay | Admitting: Primary Care

## 2023-07-30 ENCOUNTER — Other Ambulatory Visit: Payer: Self-pay

## 2023-07-30 VITALS — BP 110/65 | HR 61 | Resp 16 | Wt 167.2 lb

## 2023-07-30 DIAGNOSIS — E1169 Type 2 diabetes mellitus with other specified complication: Secondary | ICD-10-CM

## 2023-07-30 DIAGNOSIS — Z794 Long term (current) use of insulin: Secondary | ICD-10-CM

## 2023-07-30 DIAGNOSIS — Z7985 Long-term (current) use of injectable non-insulin antidiabetic drugs: Secondary | ICD-10-CM

## 2023-07-30 DIAGNOSIS — Z760 Encounter for issue of repeat prescription: Secondary | ICD-10-CM

## 2023-07-30 DIAGNOSIS — R0981 Nasal congestion: Secondary | ICD-10-CM

## 2023-07-30 DIAGNOSIS — Z7984 Long term (current) use of oral hypoglycemic drugs: Secondary | ICD-10-CM

## 2023-07-30 DIAGNOSIS — E785 Hyperlipidemia, unspecified: Secondary | ICD-10-CM

## 2023-07-30 DIAGNOSIS — J302 Other seasonal allergic rhinitis: Secondary | ICD-10-CM

## 2023-07-30 DIAGNOSIS — I10 Essential (primary) hypertension: Secondary | ICD-10-CM

## 2023-07-30 DIAGNOSIS — Z1231 Encounter for screening mammogram for malignant neoplasm of breast: Secondary | ICD-10-CM

## 2023-07-30 LAB — POCT GLYCOSYLATED HEMOGLOBIN (HGB A1C): HbA1c, POC (controlled diabetic range): 7.9 % — AB (ref 0.0–7.0)

## 2023-07-30 MED ORDER — TRULICITY 1.5 MG/0.5ML ~~LOC~~ SOAJ
1.5000 mg | SUBCUTANEOUS | 2 refills | Status: DC
Start: 2023-07-30 — End: 2023-11-03
  Filled 2023-07-30: qty 2, 28d supply, fill #0
  Filled 2023-09-01: qty 2, 28d supply, fill #1
  Filled 2023-09-30: qty 2, 28d supply, fill #2

## 2023-07-30 MED ORDER — METFORMIN HCL 1000 MG PO TABS
1000.0000 mg | ORAL_TABLET | Freq: Two times a day (BID) | ORAL | 3 refills | Status: DC
  Filled 2023-07-30 – 2023-10-20 (×2): qty 180, 90d supply, fill #0

## 2023-07-30 MED ORDER — EMPAGLIFLOZIN 10 MG PO TABS
10.0000 mg | ORAL_TABLET | Freq: Every day | ORAL | 1 refills | Status: DC
  Filled 2023-07-30: qty 90, 90d supply, fill #0

## 2023-07-30 MED ORDER — LORATADINE 10 MG PO TABS
10.0000 mg | ORAL_TABLET | Freq: Every day | ORAL | 1 refills | Status: AC
  Filled 2023-07-30: qty 90, 90d supply, fill #0

## 2023-07-30 MED ORDER — LISINOPRIL 2.5 MG PO TABS
2.5000 mg | ORAL_TABLET | Freq: Every day | ORAL | 1 refills | Status: AC
  Filled 2023-07-30: qty 90, 90d supply, fill #0

## 2023-07-30 NOTE — Patient Instructions (Signed)

## 2023-07-30 NOTE — Progress Notes (Signed)
[facility]  Levy Scotto, is a 62 y.o. 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 (HTN/DM) Patient has No headache, No chest pain, No abdominal pain - No Nausea, No new weakness tingling or numbness, No Cough - shortness of breath. Denies polyuria, polydipsia, polyphasia or vision changes.  Does not check blood sugars at home.   No problems updated.  No Known Allergies  Past Medical History:  Diagnosis Date   Bell's palsy    Diabetes mellitus without complication (HCC)    Hyperlipidemia    Hypertension     Current Outpatient Medications on File Prior to Visit  Medication Sig Dispense Refill   albuterol (VENTOLIN HFA) 108 (90 Base) MCG/ACT inhaler Inhale 1-2 puffs into the lungs every 6 (six) hours as needed for wheezing or shortness of breath. 6.7 g 1   amoxicillin (AMOXIL) 875 MG tablet Take 1 tablet (875 mg total) by mouth every 12 (twelve) hours. (Patient not taking: Reported on 07/30/2023) 10 tablet 0   atorvastatin (LIPITOR) 80 MG tablet Take 1 tablet (80 mg total) by mouth daily. 90 tablet 1   Blood Glucose Monitoring Suppl (TRUE METRIX AIR GLUCOSE METER) w/Device KIT 1 Bag by Does not apply route 3 (three) times daily. 1 kit 0   Blood Glucose Monitoring Suppl (TRUE METRIX METER) w/Device KIT Use as instructed 1 kit 0   ezetimibe (ZETIA) 10 MG tablet Take 1 tablet (10 mg total) by mouth daily. 90 tablet 1   fenofibrate 160 MG tablet Take 1 tablet (160 mg total) by mouth daily. 90 tablet 1   fluticasone (FLONASE) 50 MCG/ACT nasal spray Place 2 sprays into both nostrils daily. 16 g 6   glucose blood (TRUE METRIX BLOOD GLUCOSE TEST) test strip USE AS INSTRUCTED 100 strip 12   ibuprofen (ADVIL) 600 MG tablet Take 1 tablet (600 mg total) by  mouth every 6 (six) hours as needed. 20 tablet 0   Insulin Pen Needle (B-D UF III MINI PEN NEEDLES) 31G X 5 MM MISC Use as instructed 100 each 5   loratadine (CLARITIN) 10 MG tablet Take 1 tablet (10 mg total) by mouth daily. 90 tablet 3   TRUEplus Lancets 28G MISC Use as instructed to check blood sugar. 100 each 6   No current facility-administered medications on file prior to visit.    Objective:   Vitals:   07/30/23 0921  BP: 110/65  Pulse: 61  Resp: 16  SpO2: 99%  Weight: 167 lb 3.2 oz (75.8 kg)    Comprehensive ROS Pertinent positive and negative noted in HPI   Exam General appearance : Awake, alert, not in any distress. Speech Clear. Not toxic looking HEENT: Atraumatic and Normocephalic, pupils equally reactive to light and accomodation Neck: Supple, no JVD. No cervical lymphadenopathy.  Chest: Good air entry bilaterally, no added sounds  CVS: S1 S2 regular, no murmurs.  Abdomen: Bowel sounds present, Non tender and not distended with no gaurding, rigidity or rebound. Extremities: B/L Lower Ext shows no edema, both legs are warm to touch Neurology: Awake alert, and oriented X 3, CN II-XII intact, Non focal Skin: No Rash  Data Review Lab Results  Component Value Date   HGBA1C 7.9 (A) 07/30/2023   HGBA1C 8.3 (A) 01/08/2023  HGBA1C 8.2 (A) 10/08/2022    Assessment & Plan   [name] was seen today for diabetes and hypertension.  Diagnoses and all orders for this visit:  Type 2 diabetes mellitus without complication, with long-term current use of insulin (HCC)  monitor carbohydrates -rice, potatoes, tortillas, breads, pasta, sweets, sodas.  Increase exercising to help maintain appropriate weight.  -     POCT glycosylated hemoglobin (Hb A1C)7.9 improving  -     Microalbumin / creatinine urine ratio -     CBC with Differential/Platelet -     CMP14+EGFR  Encounter for screening mammogram for malignant neoplasm of breast -     MM DIGITAL SCREENING BILATERAL;  Future  Hyperlipidemia associated with type 2 diabetes mellitus (HCC)  Healthy lifestyle diet of fruits vegetables fish nuts whole grains and low saturated fat . Foods high in cholesterol or liver, fatty meats,cheese, butter avocados, nuts and seeds, chocolate and fried foods. -     CMP14+EGFR -     Lipid panel  Medication refill     Dulaglutide (TRULICITY) 1.5 MG/0.5ML SOAJ; Inject 1.5 mg into the skin once a week. -     empagliflozin (JARDIANCE) 10 MG TABS tablet; Take 1 tablet (10 mg total) by mouth daily before breakfast. -     lisinopril (ZESTRIL) 2.5 MG tablet; Take 1 tablet (2.5 mg total) by mouth daily. -     metFORMIN (GLUCOPHAGE) 1000 MG tablet; Take 1 tablet (1,000 mg total) by mouth 2 (two) times daily with a meal.  Essential hypertension Well control on low dose ACE for Kidneys protection     Seasonal allergies 2/2  Nasal congestion -     loratadine (CLARITIN) 10 MG tablet; Take 1 tablet (10 mg total) by mouth daily. Has refills for flonase  Diagnoses and all orders for this visit:  Type 2 diabetes mellitus without complication, with long-term current use of insulin (HCC) -     POCT glycosylated hemoglobin (Hb A1C) -     Microalbumin / creatinine urine ratio -     CBC with Differential/Platelet -     CMP14+EGFR -     Dulaglutide (TRULICITY) 1.5 MG/0.5ML SOAJ; Inject 1.5 mg into the skin once a week. -     lisinopril (ZESTRIL) 2.5 MG tablet; Take 1 tablet (2.5 mg total) by mouth daily. -     metFORMIN (GLUCOPHAGE) 1000 MG tablet; Take 1 tablet (1,000 mg total) by mouth 2 (two) times daily with a meal.  Encounter for screening mammogram for malignant neoplasm of breast -     MM DIGITAL SCREENING BILATERAL; Future  Hyperlipidemia associated with type 2 diabetes mellitus (HCC) -     CMP14+EGFR -     Lipid panel  Medication refill -     Dulaglutide (TRULICITY) 1.5 MG/0.5ML SOAJ; Inject 1.5 mg into the skin once a week. -     empagliflozin (JARDIANCE) 10 MG TABS  tablet; Take 1 tablet (10 mg total) by mouth daily before breakfast. -     lisinopril (ZESTRIL) 2.5 MG tablet; Take 1 tablet (2.5 mg total) by mouth daily. -     metFORMIN (GLUCOPHAGE) 1000 MG tablet; Take 1 tablet (1,000 mg total) by mouth 2 (two) times daily with a meal.  Essential hypertension -     lisinopril (ZESTRIL) 2.5 MG tablet; Take 1 tablet (2.5 mg total) by mouth daily.    Patient have been counseled extensively about nutrition and exercise. Other issues discussed during this visit include: low cholesterol diet, weight  control and daily exercise, foot care, annual eye examinations at Ophthalmology, importance of adherence with medications and regular follow-up. We also discussed long term complications of uncontrolled diabetes and hypertension.   Return in about 3 months (around 10/30/2023) for medical conditions.  The patient was given clear instructions to go to ER or return to medical center if symptoms don't improve, worsen or new problems develop. The patient verbalized understanding. The patient was told to call to get lab results if they haven't heard anything in the next week.   This note has been created with Education officer, environmental. Any transcriptional errors are unintentional.   Grayce Sessions, NP 07/30/2023, 9:53 AM

## 2023-07-31 LAB — LIPID PANEL
Chol/HDL Ratio: 2.9 ratio (ref 0.0–4.4)
Cholesterol, Total: 92 mg/dL — ABNORMAL LOW (ref 100–199)
HDL: 32 mg/dL — ABNORMAL LOW (ref >39)
LDL Chol Calc (NIH): 38 mg/dL (ref 0–99)
Triglycerides: 119 mg/dL (ref 0–149)
VLDL Cholesterol Cal: 22 mg/dL (ref 5–40)

## 2023-07-31 LAB — CMP14+EGFR
ALT: 22 [IU]/L (ref 0–32)
AST: 25 [IU]/L (ref 0–40)
Albumin: 4.8 g/dL (ref 3.8–4.9)
Alkaline Phosphatase: 120 [IU]/L (ref 44–121)
BUN/Creatinine Ratio: 20 (ref 12–28)
BUN: 14 mg/dL (ref 8–27)
Bilirubin Total: 0.3 mg/dL (ref 0.0–1.2)
CO2: 20 mmol/L (ref 20–29)
Calcium: 9.9 mg/dL (ref 8.7–10.3)
Chloride: 105 mmol/L (ref 96–106)
Creatinine, Ser: 0.69 mg/dL (ref 0.57–1.00)
Globulin, Total: 3 g/dL (ref 1.5–4.5)
Glucose: 125 mg/dL — ABNORMAL HIGH (ref 70–99)
Potassium: 4.7 mmol/L (ref 3.5–5.2)
Sodium: 142 mmol/L (ref 134–144)
Total Protein: 7.8 g/dL (ref 6.0–8.5)
eGFR: 99 mL/min/{1.73_m2} (ref >59)

## 2023-07-31 LAB — MICROALBUMIN / CREATININE URINE RATIO
Creatinine, Urine: 47.7 mg/dL
Microalb/Creat Ratio: 9 mg/g{creat} (ref 0–29)
Microalbumin, Urine: 4.3 ug/mL

## 2023-07-31 LAB — CBC WITH DIFFERENTIAL/PLATELET
Basophils Absolute: 0 10*3/uL (ref 0.0–0.2)
Basos: 0 %
EOS (ABSOLUTE): 0.4 10*3/uL (ref 0.0–0.4)
Eos: 4 %
Hematocrit: 45.5 % (ref 34.0–46.6)
Hemoglobin: 15 g/dL (ref 11.1–15.9)
Immature Grans (Abs): 0 10*3/uL (ref 0.0–0.1)
Immature Granulocytes: 1 %
Lymphocytes Absolute: 2.9 10*3/uL (ref 0.7–3.1)
Lymphs: 35 %
MCH: 29.5 pg (ref 26.6–33.0)
MCHC: 33 g/dL (ref 31.5–35.7)
MCV: 89 fL (ref 79–97)
Monocytes Absolute: 0.6 10*3/uL (ref 0.1–0.9)
Monocytes: 7 %
Neutrophils Absolute: 4.2 10*3/uL (ref 1.4–7.0)
Neutrophils: 53 %
Platelets: 252 10*3/uL (ref 150–450)
RBC: 5.09 x10E6/uL (ref 3.77–5.28)
RDW: 13.1 % (ref 11.7–15.4)
WBC: 8.1 10*3/uL (ref 3.4–10.8)

## 2023-08-04 ENCOUNTER — Other Ambulatory Visit: Payer: Self-pay

## 2023-08-12 ENCOUNTER — Other Ambulatory Visit (INDEPENDENT_AMBULATORY_CARE_PROVIDER_SITE_OTHER): Payer: Self-pay | Admitting: Primary Care

## 2023-08-12 ENCOUNTER — Other Ambulatory Visit: Payer: Self-pay

## 2023-08-12 DIAGNOSIS — E781 Pure hyperglyceridemia: Secondary | ICD-10-CM

## 2023-08-12 DIAGNOSIS — Z760 Encounter for issue of repeat prescription: Secondary | ICD-10-CM

## 2023-08-12 MED ORDER — FENOFIBRATE 160 MG PO TABS
160.0000 mg | ORAL_TABLET | Freq: Every day | ORAL | 1 refills | Status: AC
  Filled 2023-08-12: qty 90, 90d supply, fill #0

## 2023-08-22 ENCOUNTER — Other Ambulatory Visit: Payer: Self-pay

## 2023-08-28 ENCOUNTER — Ambulatory Visit
Admission: RE | Admit: 2023-08-28 | Discharge: 2023-08-28 | Disposition: A | Discharge status: Home / Self Care | Payer: No Typology Code available for payment source | Source: Ambulatory Visit | Attending: Primary Care | Admitting: Primary Care

## 2023-08-28 DIAGNOSIS — Z1231 Encounter for screening mammogram for malignant neoplasm of breast: Secondary | ICD-10-CM

## 2023-09-01 ENCOUNTER — Other Ambulatory Visit: Payer: Self-pay

## 2023-09-15 ENCOUNTER — Other Ambulatory Visit: Payer: Self-pay

## 2023-09-18 ENCOUNTER — Other Ambulatory Visit: Payer: Self-pay

## 2023-09-25 ENCOUNTER — Other Ambulatory Visit: Payer: Self-pay

## 2023-09-30 ENCOUNTER — Other Ambulatory Visit: Payer: Self-pay

## 2023-10-02 ENCOUNTER — Other Ambulatory Visit: Payer: Self-pay

## 2023-10-13 ENCOUNTER — Other Ambulatory Visit: Payer: Self-pay

## 2023-10-20 ENCOUNTER — Other Ambulatory Visit: Payer: Self-pay

## 2023-10-20 ENCOUNTER — Other Ambulatory Visit (INDEPENDENT_AMBULATORY_CARE_PROVIDER_SITE_OTHER): Payer: Self-pay | Admitting: Primary Care

## 2023-10-20 DIAGNOSIS — E7841 Elevated Lipoprotein(a): Secondary | ICD-10-CM

## 2023-10-20 DIAGNOSIS — Z760 Encounter for issue of repeat prescription: Secondary | ICD-10-CM

## 2023-10-21 ENCOUNTER — Other Ambulatory Visit: Payer: Self-pay

## 2023-10-21 MED ORDER — EZETIMIBE 10 MG PO TABS
10.0000 mg | ORAL_TABLET | Freq: Every day | ORAL | 0 refills | Status: DC
Start: 2023-10-21 — End: 2023-10-30
  Filled 2023-10-21: qty 60, 60d supply, fill #0

## 2023-10-21 MED ORDER — ATORVASTATIN CALCIUM 80 MG PO TABS
80.0000 mg | ORAL_TABLET | Freq: Every day | ORAL | 0 refills | Status: DC
  Filled 2023-10-21 – 2023-11-06 (×2): qty 90, 90d supply, fill #0

## 2023-10-21 NOTE — Telephone Encounter (Signed)
 Requested Prescriptions  Pending Prescriptions Disp Refills   atorvastatin  (LIPITOR) 80 MG tablet 90 tablet 0    Sig: Take 1 tablet (80 mg total) by mouth daily.     Cardiovascular:  Antilipid - Statins Failed - 10/21/2023 12:31 PM      Failed - Lipid Panel in normal range within the last 12 months    Cholesterol, Total  Date Value Ref Range Status  07/30/2023 92 (L) 100 - 199 mg/dL Final   LDL Chol Calc (NIH)  Date Value Ref Range Status  07/30/2023 38 0 - 99 mg/dL Final   HDL  Date Value Ref Range Status  07/30/2023 32 (L) >39 mg/dL Final   Triglycerides  Date Value Ref Range Status  07/30/2023 119 0 - 149 mg/dL Final         Passed - Patient is not pregnant      Passed - Valid encounter within last 12 months    Recent Outpatient Visits           2 months ago Type 2 diabetes mellitus without complication, with long-term current use of insulin  (HCC)   [place] [facility] Celestia Rosaline SQUIBB, NP   5 months ago Type 2 diabetes mellitus without complication, with long-term current use of insulin  (HCC)   [place] [facility] Celestia Rosaline SQUIBB, NP   9 months ago Type 2 diabetes mellitus without complication, with long-term current use of insulin  (HCC)   [place] [facility] Celestia Rosaline SQUIBB, NP   1 year ago Type 2 diabetes mellitus without complication, with long-term current use of insulin  (HCC)   [place] [facility] Celestia Rosaline SQUIBB, NP   1 year ago Type 2 diabetes mellitus without complication, with long-term current use of insulin  [facility])   [place] [facility] Celestia Rosaline SQUIBB, NP       Future Appointments             In 1 week Celestia Rosaline SQUIBB, NP [place] [facility]             ezetimibe  (ZETIA ) 10 MG tablet 90 tablet 0    Sig: Take 1 tablet (10 mg total) by mouth daily.     Cardiovascular:  Antilipid - Sterol Transport Inhibitors  Failed - 10/21/2023 12:31 PM      Failed - Lipid Panel in normal range within the last 12 months    Cholesterol, Total  Date Value Ref Range Status  07/30/2023 92 (L) 100 - 199 mg/dL Final   LDL Chol Calc (NIH)  Date Value Ref Range Status  07/30/2023 38 0 - 99 mg/dL Final   HDL  Date Value Ref Range Status  07/30/2023 32 (L) >39 mg/dL Final   Triglycerides  Date Value Ref Range Status  07/30/2023 119 0 - 149 mg/dL Final         Passed - AST in normal range and within 360 days    AST  Date Value Ref Range Status  07/30/2023 25 0 - 40 IU/L Final         Passed - ALT in normal range and within 360 days    ALT  Date Value Ref Range Status  07/30/2023 22 0 - 32 IU/L Final         Passed - Patient is not pregnant      Passed - Valid encounter within last 12 months    Recent Outpatient Visits  2 months ago Type 2 diabetes mellitus without complication, with long-term current use of insulin  (HCC)   [place] [facility] Celestia Rosaline SQUIBB, NP   5 months ago Type 2 diabetes mellitus without complication, with long-term current use of insulin  (HCC)   [place] [facility] Celestia Rosaline SQUIBB, NP   9 months ago Type 2 diabetes mellitus without complication, with long-term current use of insulin  (HCC)   [place] [facility] Celestia Rosaline SQUIBB, NP   1 year ago Type 2 diabetes mellitus without complication, with long-term current use of insulin  (HCC)   [place] [facility] Celestia Rosaline SQUIBB, NP   1 year ago Type 2 diabetes mellitus without complication, with long-term current use of insulin  [facility])   [place] [facility] Celestia Rosaline SQUIBB, NP       Future Appointments             In 1 week Celestia, Rosaline SQUIBB, NP [place] [facility]

## 2023-10-22 ENCOUNTER — Other Ambulatory Visit: Payer: Self-pay

## 2023-10-30 ENCOUNTER — Encounter (INDEPENDENT_AMBULATORY_CARE_PROVIDER_SITE_OTHER): Payer: Self-pay | Admitting: Primary Care

## 2023-10-30 ENCOUNTER — Ambulatory Visit (INDEPENDENT_AMBULATORY_CARE_PROVIDER_SITE_OTHER): Payer: Self-pay | Admitting: Primary Care

## 2023-10-30 ENCOUNTER — Other Ambulatory Visit: Payer: Self-pay

## 2023-10-30 ENCOUNTER — Other Ambulatory Visit (HOSPITAL_COMMUNITY)
Admission: RE | Admit: 2023-10-30 | Discharge: 2023-10-30 | Disposition: A | Discharge status: Home / Self Care | Payer: No Typology Code available for payment source | Source: Ambulatory Visit | Attending: Primary Care | Admitting: Primary Care

## 2023-10-30 VITALS — BP 111/75 | HR 62 | Resp 16 | Ht <= 58 in | Wt 165.8 lb

## 2023-10-30 DIAGNOSIS — K029 Dental caries, unspecified: Secondary | ICD-10-CM

## 2023-10-30 DIAGNOSIS — E7841 Elevated Lipoprotein(a): Secondary | ICD-10-CM

## 2023-10-30 DIAGNOSIS — E1169 Type 2 diabetes mellitus with other specified complication: Secondary | ICD-10-CM

## 2023-10-30 DIAGNOSIS — Z124 Encounter for screening for malignant neoplasm of cervix: Secondary | ICD-10-CM

## 2023-10-30 DIAGNOSIS — E785 Hyperlipidemia, unspecified: Secondary | ICD-10-CM

## 2023-10-30 DIAGNOSIS — E119 Type 2 diabetes mellitus without complications: Secondary | ICD-10-CM

## 2023-10-30 DIAGNOSIS — Z7984 Long term (current) use of oral hypoglycemic drugs: Secondary | ICD-10-CM

## 2023-10-30 DIAGNOSIS — Z760 Encounter for issue of repeat prescription: Secondary | ICD-10-CM

## 2023-10-30 DIAGNOSIS — Z794 Long term (current) use of insulin: Secondary | ICD-10-CM

## 2023-10-30 DIAGNOSIS — Z7985 Long-term (current) use of injectable non-insulin antidiabetic drugs: Secondary | ICD-10-CM

## 2023-10-30 DIAGNOSIS — I10 Essential (primary) hypertension: Secondary | ICD-10-CM

## 2023-10-30 LAB — POCT GLYCOSYLATED HEMOGLOBIN (HGB A1C): HbA1c, POC (controlled diabetic range): 7.7 % — AB (ref 0.0–7.0)

## 2023-10-30 MED ORDER — EZETIMIBE 10 MG PO TABS: 10.0000 mg | ORAL_TABLET | Freq: Every day | ORAL | 0 refills | Status: AC

## 2023-10-30 NOTE — Progress Notes (Signed)
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  Note she is also a diabetic will need urgent dental assistance and dentures to eat properly which also helps manage her diabetes.  No obstetric history on file. female being seen today for a routine well-woman exam.   CC: gyn   The current method of family planning is none.  No LMP recorded. Patient has had a hysterectomy. Last pap 08/14/17. Results were:  unknown Last mammogram: unknown. Results were:  Family h/o breast cancer: No Cologuard Results were: normal. Family h/o colorectal cancer: No  Health Maintenance  Topic Date Due   Eye exam for diabetics  Never done   Pap with HPV screening  08/14/2020   COVID-19 Vaccine (1 - 2024-25 season) Never done   Complete foot exam   10/09/2023   Hemoglobin A1C  04/28/2024   Yearly kidney function blood test for diabetes  07/29/2024   Yearly kidney health urinalysis for diabetes  07/29/2024   Cologuard (Stool DNA test)  08/08/2025   Mammogram  08/27/2025   DTaP/Tdap/Td vaccine (2 - Td or Tdap) 09/23/2027   Pneumococcal Vaccination (3 of 3 - PPSV23 or PCV20) 04/15/2028   Flu Shot  Completed   Hepatitis C Screening  Completed   HIV Screening  Completed   Zoster (Shingles) Vaccine  Completed   HPV Vaccine  Aged Out   Review of Systems:    Denies any headaches, blurred vision, fatigue, shortness of breath, chest pain, abdominal pain, abnormal vaginal discharge/itching/odor/irritation, problems with periods, bowel movements, urination, or intercourse unless  otherwise stated above.  Pertinent History Reviewed:   Reviewed past medical,surgical, social and family history.  Reviewed problem list, medications and allergies.  Physical Assessment:   Vitals:   10/30/23 0842  BP: 111/75  Pulse: 62  Resp: 16  SpO2: 98%  Weight: 165 lb 12.8 oz (75.2 kg)  Height: 4' 9.5" (1.461 m)  Body mass index is 35.26 kg/m.        Physical Examination:  General appearance - well appearing, and in no distress Mental status - alert, oriented to person, place, and time Psych:  She has a normal mood and affect Skin - warm and dry, normal color, no suspicious lesions noted Chest - effort normal, all lung fields clear to auscultation bilaterally Heart - normal rate and regular rhythm Neck:  midline trachea, no thyromegaly or nodules Breasts - breasts appear normal, no suspicious masses, no skin or nipple changes or axillary nodes Educated patient on proper self breast examination and had patient to demonstrate SBE. Abdomen - soft, nontender, nondistended, no masses or organomegaly Pelvic-VULVA: normal appearing vulva with no masses, tenderness or lesions   VAGINA: normal appearing vagina with normal color and discharge, no lesions   CERVIX: normal appearing cervix without discharge or lesions, no CMT UTERUS: uterus is felt to be normal size, shape, consistency and nontender  ADNEXA: No adnexal masses or tenderness noted. Extremities:  No swelling or varicosities noted  Results for orders placed or performed in visit on 10/30/23 (from the past 24 hours)  POCT glycosylated hemoglobin (Hb A1C)  Collection Time: 10/30/23  8:48 AM  Result Value Ref Range   Hemoglobin A1C     HbA1c POC (<> result, manual entry)     HbA1c, POC (prediabetic range)     HbA1c, POC (controlled diabetic range) 7.7 (A) 0.0 - 7.0 %     Assessment & Plan:   Naziyah was seen today for diabetes, hyperlipidemia, hypertension and gynecologic exam.  Diagnoses and all orders for this  visit:  Type 2 diabetes mellitus without complication, with long-term current use of insulin (HCC) Monitor carbohydrates -rice, potatoes, tortillas, breads, pasta, sweets, sodas.  Increase exercising to help maintain appropriate weight.  -     CBC with Differential/Platelet -     CMP14+EGFR -     POCT glycosylated hemoglobin (Hb A1C)  Essential hypertension Well controlled  DIET: Limit salt intake, read nutrition labels to check salt content, limit fried and high fatty foods  Avoid using multisymptom OTC cold preparations that generally contain sudafed which can rise BP. Consult with pharmacist on best cold relief products to use for persons with HTN EXERCISE Discussed incorporating exercise such as walking - 30 minutes most days of the week and can do in 10 minute intervals    -     CBC with Differential/Platelet -     CMP14+EGFR  Hyperlipidemia associated with type 2 diabetes mellitus (HCC) -     Lipid panel  Cervical cancer screening -     Cervicovaginal ancillary only -     Cytology - PAP  Orders Placed This Encounter  Procedures   CBC with Differential/Platelet   CMP14+EGFR   Lipid panel   POCT glycosylated hemoglobin (Hb A1C)   Follow-up: Return in about 3 months (around 01/27/2024) for DM.  This note has been created with Education officer, environmental. Any transcriptional errors are unintentional.   Grayce Sessions, NP 10/30/2023, 4:17 PM

## 2023-10-31 LAB — CMP14+EGFR
ALT: 31 [IU]/L (ref 0–32)
AST: 22 [IU]/L (ref 0–40)
Albumin: 4.3 g/dL (ref 3.8–4.9)
Alkaline Phosphatase: 194 [IU]/L — ABNORMAL HIGH (ref 44–121)
BUN/Creatinine Ratio: 23 (ref 12–28)
BUN: 11 mg/dL (ref 8–27)
Bilirubin Total: 0.3 mg/dL (ref 0.0–1.2)
CO2: 25 mmol/L (ref 20–29)
Calcium: 9.6 mg/dL (ref 8.7–10.3)
Chloride: 103 mmol/L (ref 96–106)
Creatinine, Ser: 0.48 mg/dL — ABNORMAL LOW (ref 0.57–1.00)
Globulin, Total: 2.9 g/dL (ref 1.5–4.5)
Glucose: 121 mg/dL — ABNORMAL HIGH (ref 70–99)
Potassium: 4.2 mmol/L (ref 3.5–5.2)
Sodium: 143 mmol/L (ref 134–144)
Total Protein: 7.2 g/dL (ref 6.0–8.5)
eGFR: 108 mL/min/{1.73_m2} (ref >59)

## 2023-10-31 LAB — CBC WITH DIFFERENTIAL/PLATELET
Basophils Absolute: 0 10*3/uL (ref 0.0–0.2)
Basos: 0 %
EOS (ABSOLUTE): 0.2 10*3/uL (ref 0.0–0.4)
Eos: 3 %
Hematocrit: 47.1 % — ABNORMAL HIGH (ref 34.0–46.6)
Hemoglobin: 15.5 g/dL (ref 11.1–15.9)
Immature Grans (Abs): 0.1 10*3/uL (ref 0.0–0.1)
Immature Granulocytes: 1 %
Lymphocytes Absolute: 2.7 10*3/uL (ref 0.7–3.1)
Lymphs: 42 %
MCH: 30.6 pg (ref 26.6–33.0)
MCHC: 32.9 g/dL (ref 31.5–35.7)
MCV: 93 fL (ref 79–97)
Monocytes Absolute: 0.4 10*3/uL (ref 0.1–0.9)
Monocytes: 7 %
Neutrophils Absolute: 3.1 10*3/uL (ref 1.4–7.0)
Neutrophils: 47 %
Platelets: 208 10*3/uL (ref 150–450)
RBC: 5.07 x10E6/uL (ref 3.77–5.28)
RDW: 13.4 % (ref 11.7–15.4)
WBC: 6.4 10*3/uL (ref 3.4–10.8)

## 2023-10-31 LAB — LIPID PANEL
Chol/HDL Ratio: 4.3 {ratio} (ref 0.0–4.4)
Cholesterol, Total: 197 mg/dL (ref 100–199)
HDL: 46 mg/dL (ref >39)
LDL Chol Calc (NIH): 110 mg/dL — ABNORMAL HIGH (ref 0–99)
Triglycerides: 238 mg/dL — ABNORMAL HIGH (ref 0–149)
VLDL Cholesterol Cal: 41 mg/dL — ABNORMAL HIGH (ref 5–40)

## 2023-10-31 LAB — CERVICOVAGINAL ANCILLARY ONLY
Bacterial Vaginitis (gardnerella): NEGATIVE
Candida Glabrata: NEGATIVE
Candida Vaginitis: NEGATIVE
Chlamydia: NEGATIVE
Comment: NEGATIVE
Comment: NEGATIVE
Comment: NEGATIVE
Comment: NEGATIVE
Comment: NEGATIVE
Comment: NORMAL
Neisseria Gonorrhea: NEGATIVE
Trichomonas: NEGATIVE

## 2023-11-01 ENCOUNTER — Encounter (INDEPENDENT_AMBULATORY_CARE_PROVIDER_SITE_OTHER): Payer: Self-pay | Admitting: Primary Care

## 2023-11-01 NOTE — Addendum Note (Signed)
Addended by: Gwinda Passe on: 11/01/2023 04:17 PM   Modules accepted: Orders

## 2023-11-03 ENCOUNTER — Other Ambulatory Visit (INDEPENDENT_AMBULATORY_CARE_PROVIDER_SITE_OTHER): Payer: Self-pay | Admitting: Primary Care

## 2023-11-03 ENCOUNTER — Other Ambulatory Visit: Payer: Self-pay

## 2023-11-03 DIAGNOSIS — Z760 Encounter for issue of repeat prescription: Secondary | ICD-10-CM

## 2023-11-03 DIAGNOSIS — Z794 Long term (current) use of insulin: Secondary | ICD-10-CM

## 2023-11-03 NOTE — Telephone Encounter (Signed)
Requested medication (s) are due for refill today: yes   Requested medication (s) are on the active medication list: yes   Last refill:  07/30/23 #93ml 2 refills   Future visit scheduled: yes in 2 months   Notes to clinic:  do you want to continue refills?  To pharmacy: To hold patient over until Ozempic is approved for PASS. Please do not d/c Ozempic off profile.       Requested Prescriptions  Pending Prescriptions Disp Refills   Dulaglutide (TRULICITY) 1.5 MG/0.5ML SOAJ 2 mL 2    Sig: Inject 1.5 mg into the skin once a week.     Endocrinology:  Diabetes - GLP-1 Receptor Agonists Passed - 11/03/2023  4:19 PM      Passed - HBA1C is between 0 and 7.9 and within 180 days    HbA1c, POC (controlled diabetic range)  Date Value Ref Range Status  10/30/2023 7.7 (A) 0.0 - 7.0 % Final         Passed - Valid encounter within last 6 months    Recent Outpatient Visits           4 days ago Type 2 diabetes mellitus without complication, with long-term current use of insulin (HCC)   [place] [facility] Grayce Sessions, NP   3 months ago Type 2 diabetes mellitus without complication, with long-term current use of insulin (HCC)   [place] [facility] Grayce Sessions, NP   6 months ago Type 2 diabetes mellitus without complication, with long-term current use of insulin (HCC)   [place] [facility] Grayce Sessions, NP   9 months ago Type 2 diabetes mellitus without complication, with long-term current use of insulin (HCC)   [place] [facility] Grayce Sessions, NP   1 year ago Type 2 diabetes mellitus without complication, with long-term current use of insulin [facility])   [place] [facility] Grayce Sessions, NP       Future Appointments             In 2 months Randa Evens, Kinnie Scales, NP  [facility]

## 2023-11-04 LAB — CYTOLOGY - PAP
Comment: NEGATIVE
Diagnosis: NEGATIVE
High risk HPV: NEGATIVE

## 2023-11-05 MED ORDER — TRULICITY 1.5 MG/0.5ML ~~LOC~~ SOAJ
1.5000 mg | SUBCUTANEOUS | 2 refills | Status: DC
  Filled 2023-11-05: qty 2, 28d supply, fill #0
  Filled 2023-12-09: qty 2, 28d supply, fill #1
  Filled 2024-01-05 (×2): qty 2, 28d supply, fill #2

## 2023-11-05 NOTE — Telephone Encounter (Signed)
 Will forward to provider

## 2023-11-06 ENCOUNTER — Other Ambulatory Visit: Payer: Self-pay

## 2023-11-07 ENCOUNTER — Other Ambulatory Visit: Payer: Self-pay

## 2023-11-10 ENCOUNTER — Other Ambulatory Visit: Payer: Self-pay

## 2023-12-09 ENCOUNTER — Other Ambulatory Visit: Payer: Self-pay

## 2024-01-02 ENCOUNTER — Other Ambulatory Visit: Payer: Self-pay

## 2024-01-05 ENCOUNTER — Other Ambulatory Visit: Payer: Self-pay

## 2024-01-06 ENCOUNTER — Other Ambulatory Visit: Payer: Self-pay

## 2024-01-30 ENCOUNTER — Ambulatory Visit (INDEPENDENT_AMBULATORY_CARE_PROVIDER_SITE_OTHER): Payer: Self-pay | Admitting: Primary Care

## 2024-01-30 ENCOUNTER — Encounter (INDEPENDENT_AMBULATORY_CARE_PROVIDER_SITE_OTHER): Payer: Self-pay | Admitting: Primary Care

## 2024-01-30 ENCOUNTER — Other Ambulatory Visit: Payer: Self-pay

## 2024-01-30 VITALS — BP 131/83 | HR 73 | Resp 16 | Wt 168.6 lb

## 2024-01-30 DIAGNOSIS — Z794 Long term (current) use of insulin: Secondary | ICD-10-CM

## 2024-01-30 DIAGNOSIS — E119 Type 2 diabetes mellitus without complications: Secondary | ICD-10-CM

## 2024-01-30 DIAGNOSIS — R0981 Nasal congestion: Secondary | ICD-10-CM

## 2024-01-30 DIAGNOSIS — E785 Hyperlipidemia, unspecified: Secondary | ICD-10-CM

## 2024-01-30 DIAGNOSIS — J302 Other seasonal allergic rhinitis: Secondary | ICD-10-CM

## 2024-01-30 DIAGNOSIS — I10 Essential (primary) hypertension: Secondary | ICD-10-CM

## 2024-01-30 DIAGNOSIS — E1169 Type 2 diabetes mellitus with other specified complication: Secondary | ICD-10-CM

## 2024-01-30 DIAGNOSIS — Z760 Encounter for issue of repeat prescription: Secondary | ICD-10-CM

## 2024-01-30 DIAGNOSIS — Z2821 Immunization not carried out because of patient refusal: Secondary | ICD-10-CM

## 2024-01-30 MED ORDER — FLUTICASONE PROPIONATE 50 MCG/ACT NA SUSP
2.0000 | Freq: Every day | NASAL | 6 refills | Status: AC
  Filled 2024-01-30: qty 16, 30d supply, fill #0

## 2024-01-30 MED ORDER — TRULICITY 3 MG/0.5ML ~~LOC~~ SOAJ
3.0000 mg | SUBCUTANEOUS | 1 refills | Status: DC
  Filled 2024-01-30: qty 2, 28d supply, fill #0

## 2024-01-30 MED ORDER — EMPAGLIFLOZIN 10 MG PO TABS
10.0000 mg | ORAL_TABLET | Freq: Every day | ORAL | 1 refills | Status: AC
  Filled 2024-01-30: qty 90, 90d supply, fill #0

## 2024-01-30 NOTE — Progress Notes (Signed)
 [facility]  Denise Mosley, is a 63 y.o. female  XBJ:[id number]  HYQ:[id number]  DOB - Aug 21, 1961 NO      Subjective:   Denise Mosley is a 63 y.o. female here today for a follow up visit. T2D- Denies polyuria, polydipsia, polyphasia or vision changes.  Does not check blood sugars at home./s.  HTN-Patient has No headache, No chest pain, No abdominal pain - No Nausea, No new weakness tingling or numbness, No Cough - shortness of breath Helen Loa [phone] No problems updated.  Comprehensive ROS Pertinent positive and negative noted in HPI   No Known Allergies  Past Medical History:  Diagnosis Date   Bell's palsy    Diabetes mellitus without complication (HCC)    Hyperlipidemia    Hypertension     Current Outpatient Medications on File Prior to Visit  Medication Sig Dispense Refill   albuterol  (VENTOLIN  HFA) 108 (90 Base) MCG/ACT inhaler Inhale 1-2 puffs into the lungs every 6 (six) hours as needed for wheezing or shortness of breath. 6.7 g 1   atorvastatin  (LIPITOR) 80 MG tablet Take 1 tablet (80 mg total) by mouth daily. 90 tablet 0   Blood Glucose Monitoring Suppl (TRUE METRIX AIR GLUCOSE METER) w/Device KIT 1 Bag by Does not apply route 3 (three) times daily. 1 kit 0   Blood Glucose Monitoring Suppl (TRUE METRIX METER) w/Device KIT Use as instructed 1 kit 0   ezetimibe  (ZETIA ) 10 MG tablet Take 1 tablet (10 mg total) by mouth daily. 90 tablet 0   fenofibrate  160 MG tablet Take 1 tablet (160 mg total) by mouth daily. 90 tablet 1   ibuprofen  (ADVIL ) 600 MG tablet Take 1 tablet (600 mg total) by mouth every 6 (six) hours as needed. 20 tablet 0   Insulin  Pen Needle (B-D UF III MINI PEN NEEDLES) 31G X 5 MM MISC Use as instructed 100 each 5   lisinopril  (ZESTRIL ) 2.5 MG tablet Take 1 tablet (2.5 mg total) by mouth daily. 90 tablet 1   loratadine  (CLARITIN ) 10 MG tablet Take 1 tablet (10 mg total) by mouth daily. 90 tablet 1   metFORMIN  (GLUCOPHAGE ) 1000 MG  tablet Take 1 tablet (1,000 mg total) by mouth 2 (two) times daily with a meal. 180 tablet 3   TRUEplus Lancets 28G MISC Use as instructed to check blood sugar. 100 each 6   No current facility-administered medications on file prior to visit.   Health Maintenance  Topic Date Due   Eye exam for diabetics  Never done   COVID-19 Vaccine (1 - 2024-25 season) Never done   Pneumococcal Vaccination (3 of 3 - PCV20 or PCV21) 01/29/2025*   Flu Shot  04/16/2024   Hemoglobin A1C  04/28/2024   Yearly kidney health urinalysis for diabetes  07/29/2024   Yearly kidney function blood test for diabetes  10/29/2024   Complete foot exam   10/29/2024   Cologuard (Stool DNA test)  08/08/2025   Mammogram  08/27/2025   DTaP/Tdap/Td vaccine (2 - Td or Tdap) 09/23/2027   Pap with HPV screening  10/29/2028   Hepatitis C Screening  Completed   HIV Screening  Completed   Zoster (Shingles) Vaccine  Completed   HPV Vaccine  Aged Out   Meningitis B Vaccine  Aged Out  *Topic was postponed. The date shown is not the original due date.    Objective:  BP 131/83   Pulse 73   Resp 16   Wt 168 lb 9.6 oz (76.5 kg)  SpO2 97%   BMI 35.85 kg/m    Physical Exam Vitals reviewed.  Constitutional:      Appearance: Normal appearance. She is obese.     Comments: morbid  HENT:     Head: Normocephalic.     Right Ear: Tympanic membrane, ear canal and external ear normal.     Left Ear: Tympanic membrane, ear canal and external ear normal.     Nose: Nose normal.     Mouth/Throat:     Mouth: Mucous membranes are moist.  Eyes:     Extraocular Movements: Extraocular movements intact.     Pupils: Pupils are equal, round, and reactive to light.  Cardiovascular:     Rate and Rhythm: Normal rate.  Pulmonary:     Effort: Pulmonary effort is normal.     Breath sounds: Normal breath sounds.  Abdominal:     General: Bowel sounds are normal. There is distension.     Palpations: Abdomen is soft.  Musculoskeletal:         General: Normal range of motion.     Cervical back: Normal range of motion.  Skin:    General: Skin is warm and dry.  Neurological:     Mental Status: She is alert and oriented to person, place, and time.  Psychiatric:        Mood and Affect: Mood normal.        Behavior: Behavior normal.        Thought Content: Thought content normal.       Assessment & Plan  Kniyah was seen today for diabetes, hypertension and hyperlipidemia.  Diagnoses and all orders for this visit:  Type 2 diabetes mellitus without complication, with long-term current use of insulin  (HCC) - educated on lifestyle modifications, including but not limited to diet choices and adding exercise to daily routine.   -     CMP14+EGFR -     CBC with Differential -     Hemoglobin A1c 7.7 Increased Trulicity  .3mg  Q weekly  Essential hypertension Well controlled 140/90  DIET: Limit salt intake, read nutrition labels to check salt content, limit fried and high fatty foods  Avoid using multisymptom OTC cold preparations that generally contain sudafed which can rise BP. Consult with pharmacist on best cold relief products to use for persons with HTN EXERCISE Discussed incorporating exercise such as walking - 30 minutes most days of the week and can do in 10 minute intervals    -     CMP14+EGFR  Hyperlipidemia associated with type 2 diabetes mellitus (HCC) -     Lipid Panel  Medication refill -     empagliflozin  (JARDIANCE ) 10 MG TABS tablet; Take 1 tablet (10 mg total) by mouth daily before breakfast.  Morbid obesity (HCC) Discussed diet and exercise for person with BMI >25. Instructed: You must burn more calories than you eat. Losing 5 percent of your body weight should be considered a success. In the longer term, losing more than 15 percent of your body weight and staying at this weight is an extremely good result. However, keep in mind that even losing 5 percent of your body weight leads to important health benefits, so  try not to get discouraged if you're not able to lose more than this. Exercises daily   Pneumococcal vaccination declined  Other orders -     Dulaglutide  (TRULICITY ) 3 MG/0.5ML SOAJ; Inject 3 mg as directed once a week.    Patient have been counseled extensively about nutrition and  exercise. Other issues discussed during this visit include: low cholesterol diet, weight control and daily exercise, foot care, annual eye examinations at Ophthalmology, importance of adherence with medications and regular follow-up. We also discussed long term complications of uncontrolled diabetes and hypertension.   Return in about 3 months (around 05/01/2024).  The patient was given clear instructions to go to ER or return to medical center if symptoms don't improve, worsen or new problems develop. The patient verbalized understanding. The patient was told to call to get lab results if they haven't heard anything in the next week.   This note has been created with Education officer, environmental. Any transcriptional errors are unintentional.   Marius Siemens, NP 01/30/2024, 9:30 AM

## 2024-01-31 LAB — CMP14+EGFR
ALT: 24 IU/L (ref 0–32)
AST: 21 IU/L (ref 0–40)
Albumin: 4.4 g/dL (ref 3.8–4.9)
Alkaline Phosphatase: 186 IU/L — ABNORMAL HIGH (ref 44–121)
BUN/Creatinine Ratio: 20 (ref 12–28)
BUN: 10 mg/dL (ref 8–27)
Bilirubin Total: 0.4 mg/dL (ref 0.0–1.2)
CO2: 22 mmol/L (ref 20–29)
Calcium: 9.7 mg/dL (ref 8.7–10.3)
Chloride: 102 mmol/L (ref 96–106)
Creatinine, Ser: 0.51 mg/dL — ABNORMAL LOW (ref 0.57–1.00)
Globulin, Total: 2.7 g/dL (ref 1.5–4.5)
Glucose: 147 mg/dL — ABNORMAL HIGH (ref 70–99)
Potassium: 4.4 mmol/L (ref 3.5–5.2)
Sodium: 141 mmol/L (ref 134–144)
Total Protein: 7.1 g/dL (ref 6.0–8.5)
eGFR: 107 mL/min/{1.73_m2} (ref >59)

## 2024-01-31 LAB — CBC WITH DIFFERENTIAL/PLATELET
Basophils Absolute: 0 10*3/uL (ref 0.0–0.2)
Basos: 0 %
EOS (ABSOLUTE): 0.3 10*3/uL (ref 0.0–0.4)
Eos: 4 %
Hematocrit: 43.6 % (ref 34.0–46.6)
Hemoglobin: 14.2 g/dL (ref 11.1–15.9)
Immature Grans (Abs): 0 10*3/uL (ref 0.0–0.1)
Immature Granulocytes: 0 %
Lymphocytes Absolute: 2.3 10*3/uL (ref 0.7–3.1)
Lymphs: 32 %
MCH: 30 pg (ref 26.6–33.0)
MCHC: 32.6 g/dL (ref 31.5–35.7)
MCV: 92 fL (ref 79–97)
Monocytes Absolute: 0.5 10*3/uL (ref 0.1–0.9)
Monocytes: 6 %
Neutrophils Absolute: 4.1 10*3/uL (ref 1.4–7.0)
Neutrophils: 58 %
Platelets: 197 10*3/uL (ref 150–450)
RBC: 4.73 x10E6/uL (ref 3.77–5.28)
RDW: 12.8 % (ref 11.7–15.4)
WBC: 7.3 10*3/uL (ref 3.4–10.8)

## 2024-01-31 LAB — LIPID PANEL
Chol/HDL Ratio: 3.7 ratio (ref 0.0–4.4)
Cholesterol, Total: 119 mg/dL (ref 100–199)
HDL: 32 mg/dL — ABNORMAL LOW (ref >39)
LDL Chol Calc (NIH): 39 mg/dL (ref 0–99)
Triglycerides: 325 mg/dL — ABNORMAL HIGH (ref 0–149)
VLDL Cholesterol Cal: 48 mg/dL — ABNORMAL HIGH (ref 5–40)

## 2024-01-31 LAB — HEMOGLOBIN A1C
Est. average glucose Bld gHb Est-mCnc: 186 mg/dL
Hgb A1c MFr Bld: 8.1 % — ABNORMAL HIGH (ref 4.8–5.6)

## 2024-02-02 ENCOUNTER — Other Ambulatory Visit: Payer: Self-pay

## 2024-02-04 ENCOUNTER — Other Ambulatory Visit: Payer: Self-pay

## 2024-02-09 ENCOUNTER — Other Ambulatory Visit (INDEPENDENT_AMBULATORY_CARE_PROVIDER_SITE_OTHER): Payer: Self-pay | Admitting: Primary Care

## 2024-02-09 ENCOUNTER — Ambulatory Visit (INDEPENDENT_AMBULATORY_CARE_PROVIDER_SITE_OTHER): Payer: Self-pay | Admitting: Primary Care

## 2024-02-09 DIAGNOSIS — Z760 Encounter for issue of repeat prescription: Secondary | ICD-10-CM

## 2024-02-09 DIAGNOSIS — E119 Type 2 diabetes mellitus without complications: Secondary | ICD-10-CM

## 2024-02-09 DIAGNOSIS — E781 Pure hyperglyceridemia: Secondary | ICD-10-CM

## 2024-02-09 MED ORDER — METFORMIN HCL 1000 MG PO TABS
1000.0000 mg | ORAL_TABLET | Freq: Two times a day (BID) | ORAL | 1 refills | Status: DC
  Filled 2024-02-09: qty 180, 90d supply, fill #0
  Filled 2024-06-26 – 2024-06-28 (×2): qty 180, 90d supply, fill #1

## 2024-02-10 ENCOUNTER — Other Ambulatory Visit: Payer: Self-pay

## 2024-02-12 ENCOUNTER — Other Ambulatory Visit: Payer: Self-pay

## 2024-02-12 ENCOUNTER — Other Ambulatory Visit (INDEPENDENT_AMBULATORY_CARE_PROVIDER_SITE_OTHER): Payer: Self-pay | Admitting: Primary Care

## 2024-02-12 ENCOUNTER — Other Ambulatory Visit: Payer: Self-pay | Admitting: Pharmacist

## 2024-02-12 DIAGNOSIS — Z760 Encounter for issue of repeat prescription: Secondary | ICD-10-CM

## 2024-02-12 DIAGNOSIS — E7841 Elevated Lipoprotein(a): Secondary | ICD-10-CM

## 2024-02-12 MED ORDER — TRULICITY 4.5 MG/0.5ML ~~LOC~~ SOAJ
4.5000 mg | SUBCUTANEOUS | 1 refills | Status: AC
  Filled 2024-02-12: qty 2, 28d supply, fill #0
  Filled 2024-03-16: qty 2, 28d supply, fill #1
  Filled 2024-04-08: qty 2, 28d supply, fill #2
  Filled 2024-06-26 – 2024-06-28 (×3): qty 2, 28d supply, fill #3
  Filled 2024-10-22: qty 2, 28d supply, fill #4

## 2024-02-13 ENCOUNTER — Other Ambulatory Visit: Payer: Self-pay

## 2024-02-13 MED ORDER — ATORVASTATIN CALCIUM 80 MG PO TABS
80.0000 mg | ORAL_TABLET | Freq: Every day | ORAL | 0 refills | Status: DC
Start: 2024-02-13 — End: 2024-06-24
  Filled 2024-02-13 – 2024-03-16 (×2): qty 90, 90d supply, fill #0

## 2024-02-20 ENCOUNTER — Other Ambulatory Visit: Payer: Self-pay

## 2024-02-20 MED ORDER — FENOFIBRATE 145 MG PO TABS
145.0000 mg | ORAL_TABLET | Freq: Every day | ORAL | 1 refills | Status: AC
  Filled 2024-02-20: qty 90, 90d supply, fill #0

## 2024-02-25 ENCOUNTER — Other Ambulatory Visit: Payer: Self-pay

## 2024-03-02 ENCOUNTER — Other Ambulatory Visit: Payer: Self-pay

## 2024-03-16 ENCOUNTER — Other Ambulatory Visit: Payer: Self-pay

## 2024-04-08 ENCOUNTER — Other Ambulatory Visit: Payer: Self-pay

## 2024-05-11 ENCOUNTER — Other Ambulatory Visit: Payer: Self-pay

## 2024-05-14 ENCOUNTER — Ambulatory Visit (INDEPENDENT_AMBULATORY_CARE_PROVIDER_SITE_OTHER): Admitting: Primary Care

## 2024-05-18 ENCOUNTER — Ambulatory Visit (INDEPENDENT_AMBULATORY_CARE_PROVIDER_SITE_OTHER): Admitting: Primary Care

## 2024-06-24 ENCOUNTER — Other Ambulatory Visit (INDEPENDENT_AMBULATORY_CARE_PROVIDER_SITE_OTHER): Payer: Self-pay | Admitting: Primary Care

## 2024-06-24 DIAGNOSIS — E7841 Elevated Lipoprotein(a): Secondary | ICD-10-CM

## 2024-06-24 DIAGNOSIS — E119 Type 2 diabetes mellitus without complications: Secondary | ICD-10-CM

## 2024-06-24 DIAGNOSIS — Z760 Encounter for issue of repeat prescription: Secondary | ICD-10-CM

## 2024-06-24 NOTE — Telephone Encounter (Signed)
 Copied from CRM [id number]. Topic: Clinical - Medication Refill >> Jun 24, 2024  3:12 PM Zebedee SAUNDERS wrote: Medication: metFORMIN  (GLUCOPHAGE ) 1000 MG tablet, atorvastatin  (LIPITOR) 80 MG tablet,   Has the patient contacted their pharmacy? Yes (Agent: If no, request that the patient contact the pharmacy for the refill. If patient does not wish to contact the pharmacy document the reason why and proceed with request.) (Agent: If yes, when and what did the pharmacy advise?)  This is the patient's preferred pharmacy:  [facility] [address] [place] [address] Phone: [phone] Fax: [phone]  Is this the correct pharmacy for this prescription? Yes If no, delete pharmacy and type the correct one.   Has the prescription been filled recently? Yes  Is the patient out of the medication? Yes  Has the patient been seen for an appointment in the last year OR does the patient have an upcoming appointment? Yes  Can we respond through MyChart? Yes  Agent: Please be advised that Rx refills may take up to 3 business days. We ask that you follow-up with your pharmacy.

## 2024-06-26 ENCOUNTER — Other Ambulatory Visit (INDEPENDENT_AMBULATORY_CARE_PROVIDER_SITE_OTHER): Payer: Self-pay | Admitting: Primary Care

## 2024-06-26 ENCOUNTER — Other Ambulatory Visit (HOSPITAL_COMMUNITY): Payer: Self-pay

## 2024-06-26 DIAGNOSIS — E7841 Elevated Lipoprotein(a): Secondary | ICD-10-CM

## 2024-06-26 DIAGNOSIS — Z760 Encounter for issue of repeat prescription: Secondary | ICD-10-CM

## 2024-06-28 ENCOUNTER — Other Ambulatory Visit: Payer: Self-pay

## 2024-06-28 MED ORDER — ATORVASTATIN CALCIUM 80 MG PO TABS
80.0000 mg | ORAL_TABLET | Freq: Every day | ORAL | 0 refills | Status: AC
  Filled 2024-06-28: qty 90, 90d supply, fill #0
  Filled 2024-07-09: qty 30, 30d supply, fill #0
  Filled 2024-10-22: qty 30, 30d supply, fill #1

## 2024-06-28 MED ORDER — METFORMIN HCL 1000 MG PO TABS
1000.0000 mg | ORAL_TABLET | Freq: Two times a day (BID) | ORAL | 0 refills | Status: AC
  Filled 2024-06-28 – 2024-10-22 (×2): qty 180, 90d supply, fill #0

## 2024-06-28 NOTE — Telephone Encounter (Signed)
 Requested Prescriptions  Pending Prescriptions Disp Refills   atorvastatin  (LIPITOR) 80 MG tablet 90 tablet 0    Sig: Take 1 tablet (80 mg total) by mouth daily.     Cardiovascular:  Antilipid - Statins Failed - 06/28/2024 12:28 PM      Failed - Lipid Panel in normal range within the last 12 months    Cholesterol, Total  Date Value Ref Range Status  01/30/2024 119 100 - 199 mg/dL Final   LDL Chol Calc (NIH)  Date Value Ref Range Status  01/30/2024 39 0 - 99 mg/dL Final   HDL  Date Value Ref Range Status  01/30/2024 32 (L) >39 mg/dL Final   Triglycerides  Date Value Ref Range Status  01/30/2024 325 (H) 0 - 149 mg/dL Final         Passed - Patient is not pregnant      Passed - Valid encounter within last 12 months    Recent Outpatient Visits           5 months ago Type 2 diabetes mellitus without complication, with long-term current use of insulin  (HCC)   [place] [facility] Celestia Rosaline SQUIBB, NP   8 months ago Type 2 diabetes mellitus without complication, with long-term current use of insulin  (HCC)   [place] [facility] Celestia Rosaline SQUIBB, NP   11 months ago Type 2 diabetes mellitus without complication, with long-term current use of insulin  (HCC)   [place] [facility] Celestia Rosaline SQUIBB, NP   1 year ago Type 2 diabetes mellitus without complication, with long-term current use of insulin  (HCC)   [place] [facility] Celestia Rosaline SQUIBB, NP   1 year ago Type 2 diabetes mellitus without complication, with long-term current use of insulin  (HCC)   [place] [facility] Celestia Rosaline SQUIBB, NP               metFORMIN  (GLUCOPHAGE ) 1000 MG tablet 180 tablet 0    Sig: Take 1 tablet (1,000 mg total) by mouth 2 (two) times daily with a meal.     Endocrinology:  Diabetes - Biguanides Failed - 06/28/2024 12:28 PM      Failed - Cr in normal range and within 360 days     Creatinine, Ser  Date Value Ref Range Status  01/30/2024 0.51 (L) 0.57 - 1.00 mg/dL Final         Failed - HBA1C is between 0 and 7.9 and within 180 days    HbA1c, POC (controlled diabetic range)  Date Value Ref Range Status  10/30/2023 7.7 (A) 0.0 - 7.0 % Final   Hgb A1c MFr Bld  Date Value Ref Range Status  01/30/2024 8.1 (H) 4.8 - 5.6 % Final    Comment:             Prediabetes: 5.7 - 6.4          Diabetes: >6.4          Glycemic control for adults with diabetes: <7.0          Failed - B12 Level in normal range and within 720 days    No results found for: VITAMINB12       Passed - eGFR in normal range and within 360 days    GFR calc Af Amer  Date Value Ref Range Status  12/22/2019 119 >59 mL/min/1.73 Final   GFR calc non Af Amer  Date Value Ref Range Status  12/22/2019  103 >59 mL/min/1.73 Final   eGFR  Date Value Ref Range Status  01/30/2024 107 >59 mL/min/1.73 Final         Passed - Valid encounter within last 6 months    Recent Outpatient Visits           5 months ago Type 2 diabetes mellitus without complication, with long-term current use of insulin  (HCC)   [place] [facility] Celestia Rosaline SQUIBB, NP   8 months ago Type 2 diabetes mellitus without complication, with long-term current use of insulin  (HCC)   [place] [facility] Celestia Rosaline SQUIBB, NP   11 months ago Type 2 diabetes mellitus without complication, with long-term current use of insulin  (HCC)   [place] [facility] Celestia Rosaline SQUIBB, NP   1 year ago Type 2 diabetes mellitus without complication, with long-term current use of insulin  (HCC)   [place] [facility] Celestia Rosaline SQUIBB, NP   1 year ago Type 2 diabetes mellitus without complication, with long-term current use of insulin  (HCC)   [place] [facility] Celestia Rosaline SQUIBB, NP              Passed - CBC within normal limits and completed  in the last 12 months    WBC  Date Value Ref Range Status  01/30/2024 7.3 3.4 - 10.8 x10E3/uL Final  01/23/2017 7.2 4.0 - 10.5 K/uL Final   RBC  Date Value Ref Range Status  01/30/2024 4.73 3.77 - 5.28 x10E6/uL Final  01/23/2017 4.86 3.87 - 5.11 MIL/uL Final   Hemoglobin  Date Value Ref Range Status  01/30/2024 14.2 11.1 - 15.9 g/dL Final   Hematocrit  Date Value Ref Range Status  01/30/2024 43.6 34.0 - 46.6 % Final   MCHC  Date Value Ref Range Status  01/30/2024 32.6 31.5 - 35.7 g/dL Final  94/89/7981 66.2 30.0 - 36.0 g/dL Final   [facility]  Date Value Ref Range Status  01/30/2024 30.0 26.6 - 33.0 pg Final  01/23/2017 28.8 26.0 - 34.0 pg Final   MCV  Date Value Ref Range Status  01/30/2024 92 79 - 97 fL Final   No results found for: PLTCOUNTKUC, LABPLAT, POCPLA RDW  Date Value Ref Range Status  01/30/2024 12.8 11.7 - 15.4 % Final

## 2024-06-29 NOTE — Telephone Encounter (Signed)
 Duplicate request, refilled 06/28/24.  Requested Prescriptions  Pending Prescriptions Disp Refills   atorvastatin  (LIPITOR) 80 MG tablet 90 tablet 0    Sig: Take 1 tablet (80 mg total) by mouth daily.     Cardiovascular:  Antilipid - Statins Failed - 06/29/2024 12:56 PM      Failed - Lipid Panel in normal range within the last 12 months    Cholesterol, Total  Date Value Ref Range Status  01/30/2024 119 100 - 199 mg/dL Final   LDL Chol Calc (NIH)  Date Value Ref Range Status  01/30/2024 39 0 - 99 mg/dL Final   HDL  Date Value Ref Range Status  01/30/2024 32 (L) >39 mg/dL Final   Triglycerides  Date Value Ref Range Status  01/30/2024 325 (H) 0 - 149 mg/dL Final         Passed - Patient is not pregnant      Passed - Valid encounter within last 12 months    Recent Outpatient Visits           5 months ago Type 2 diabetes mellitus without complication, with long-term current use of insulin  (HCC)   [place] [facility] Celestia Rosaline SQUIBB, NP   8 months ago Type 2 diabetes mellitus without complication, with long-term current use of insulin  (HCC)   [place] [facility] Celestia Rosaline SQUIBB, NP   11 months ago Type 2 diabetes mellitus without complication, with long-term current use of insulin  (HCC)   [place] [facility] Celestia Rosaline SQUIBB, NP   1 year ago Type 2 diabetes mellitus without complication, with long-term current use of insulin  [facility])   [place] [facility] Celestia Rosaline SQUIBB, NP   1 year ago Type 2 diabetes mellitus without complication, with long-term current use of insulin  [facility])   [place] [facility] Celestia Rosaline SQUIBB, NP

## 2024-07-01 ENCOUNTER — Other Ambulatory Visit (HOSPITAL_COMMUNITY): Payer: Self-pay

## 2024-07-07 ENCOUNTER — Other Ambulatory Visit: Payer: Self-pay

## 2024-07-08 ENCOUNTER — Other Ambulatory Visit: Payer: Self-pay

## 2024-07-09 ENCOUNTER — Other Ambulatory Visit: Payer: Self-pay

## 2024-07-19 ENCOUNTER — Other Ambulatory Visit: Payer: Self-pay

## 2024-07-21 ENCOUNTER — Ambulatory Visit (INDEPENDENT_AMBULATORY_CARE_PROVIDER_SITE_OTHER): Admitting: Primary Care

## 2024-08-03 ENCOUNTER — Other Ambulatory Visit: Payer: Self-pay

## 2024-10-20 ENCOUNTER — Telehealth (INDEPENDENT_AMBULATORY_CARE_PROVIDER_SITE_OTHER): Payer: Self-pay | Admitting: Primary Care

## 2024-10-20 NOTE — Telephone Encounter (Signed)
 Spoke to pt about upcoming appt.. Will be present

## 2024-10-21 ENCOUNTER — Encounter (INDEPENDENT_AMBULATORY_CARE_PROVIDER_SITE_OTHER): Payer: Self-pay | Admitting: Primary Care

## 2024-10-21 ENCOUNTER — Ambulatory Visit (INDEPENDENT_AMBULATORY_CARE_PROVIDER_SITE_OTHER): Payer: Self-pay | Admitting: Primary Care

## 2024-10-21 VITALS — BP 117/75 | HR 68 | Temp 98.2°F | Resp 16 | Ht <= 58 in | Wt 164.2 lb

## 2024-10-21 DIAGNOSIS — I10 Essential (primary) hypertension: Secondary | ICD-10-CM

## 2024-10-21 DIAGNOSIS — E119 Type 2 diabetes mellitus without complications: Secondary | ICD-10-CM

## 2024-10-21 DIAGNOSIS — E781 Pure hyperglyceridemia: Secondary | ICD-10-CM

## 2024-10-22 ENCOUNTER — Other Ambulatory Visit: Payer: Self-pay

## 2024-10-22 ENCOUNTER — Ambulatory Visit: Payer: Self-pay

## 2024-10-22 DIAGNOSIS — E781 Pure hyperglyceridemia: Secondary | ICD-10-CM

## 2024-10-22 DIAGNOSIS — I10 Essential (primary) hypertension: Secondary | ICD-10-CM

## 2024-10-22 DIAGNOSIS — E119 Type 2 diabetes mellitus without complications: Secondary | ICD-10-CM
